# Patient Record
Sex: Male | Born: 1965 | Race: White | Hispanic: No | Marital: Single | Smoking: Current every day smoker
Health system: Southern US, Community
[De-identification: ages and names within clinical notes are randomized; demographics above are authoritative.]

## PROBLEM LIST (undated history)

## (undated) DIAGNOSIS — I10 Essential (primary) hypertension: Secondary | ICD-10-CM

## (undated) DIAGNOSIS — M1990 Unspecified osteoarthritis, unspecified site: Secondary | ICD-10-CM

## (undated) DIAGNOSIS — F329 Major depressive disorder, single episode, unspecified: Secondary | ICD-10-CM

## (undated) DIAGNOSIS — G4700 Insomnia, unspecified: Secondary | ICD-10-CM

## (undated) DIAGNOSIS — F32A Depression, unspecified: Secondary | ICD-10-CM

## (undated) DIAGNOSIS — G43909 Migraine, unspecified, not intractable, without status migrainosus: Secondary | ICD-10-CM

## (undated) DIAGNOSIS — C801 Malignant (primary) neoplasm, unspecified: Secondary | ICD-10-CM

## (undated) DIAGNOSIS — Z87898 Personal history of other specified conditions: Secondary | ICD-10-CM

## (undated) DIAGNOSIS — N2889 Other specified disorders of kidney and ureter: Secondary | ICD-10-CM

## (undated) DIAGNOSIS — F419 Anxiety disorder, unspecified: Secondary | ICD-10-CM

## (undated) HISTORY — PX: MANDIBLE FRACTURE SURGERY: SHX706

---
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
## 2001-10-08 ENCOUNTER — Emergency Department (HOSPITAL_COMMUNITY): Admission: EM | Admit: 2001-10-08 | Discharge: 2001-10-08 | Payer: Self-pay | Admitting: Emergency Medicine

## 2007-01-16 ENCOUNTER — Emergency Department (HOSPITAL_COMMUNITY): Admission: EM | Admit: 2007-01-16 | Discharge: 2007-01-16 | Payer: Self-pay | Admitting: Emergency Medicine

## 2007-02-15 ENCOUNTER — Emergency Department (HOSPITAL_COMMUNITY): Admission: EM | Admit: 2007-02-15 | Discharge: 2007-02-15 | Payer: Self-pay | Admitting: Emergency Medicine

## 2007-06-22 ENCOUNTER — Emergency Department (HOSPITAL_COMMUNITY): Admission: EM | Admit: 2007-06-22 | Discharge: 2007-06-23 | Payer: Self-pay | Admitting: Emergency Medicine

## 2007-09-09 ENCOUNTER — Emergency Department (HOSPITAL_COMMUNITY): Admission: EM | Admit: 2007-09-09 | Discharge: 2007-09-09 | Payer: Self-pay | Admitting: Emergency Medicine

## 2007-11-22 ENCOUNTER — Emergency Department (HOSPITAL_COMMUNITY): Admission: EM | Admit: 2007-11-22 | Discharge: 2007-11-22 | Payer: Self-pay | Admitting: Emergency Medicine

## 2007-12-09 ENCOUNTER — Emergency Department (HOSPITAL_COMMUNITY): Admission: EM | Admit: 2007-12-09 | Discharge: 2007-12-10 | Payer: Self-pay | Admitting: Emergency Medicine

## 2007-12-11 ENCOUNTER — Emergency Department (HOSPITAL_COMMUNITY): Admission: EM | Admit: 2007-12-11 | Discharge: 2007-12-11 | Payer: Self-pay | Admitting: Emergency Medicine

## 2008-04-19 ENCOUNTER — Emergency Department (HOSPITAL_COMMUNITY): Admission: EM | Admit: 2008-04-19 | Discharge: 2008-04-19 | Payer: Self-pay | Admitting: Emergency Medicine

## 2008-04-25 ENCOUNTER — Emergency Department (HOSPITAL_COMMUNITY): Admission: EM | Admit: 2008-04-25 | Discharge: 2008-04-25 | Payer: Self-pay | Admitting: Emergency Medicine

## 2008-07-24 ENCOUNTER — Emergency Department (HOSPITAL_COMMUNITY): Admission: EM | Admit: 2008-07-24 | Discharge: 2008-07-24 | Payer: Self-pay | Admitting: Emergency Medicine

## 2008-07-28 ENCOUNTER — Emergency Department (HOSPITAL_COMMUNITY): Admission: EM | Admit: 2008-07-28 | Discharge: 2008-07-29 | Payer: Self-pay | Admitting: Emergency Medicine

## 2008-08-08 ENCOUNTER — Emergency Department: Payer: Self-pay | Admitting: Emergency Medicine

## 2009-02-14 ENCOUNTER — Emergency Department (HOSPITAL_COMMUNITY): Admission: EM | Admit: 2009-02-14 | Discharge: 2009-02-14 | Payer: Self-pay | Admitting: Emergency Medicine

## 2009-02-14 ENCOUNTER — Emergency Department (HOSPITAL_COMMUNITY): Admission: EM | Admit: 2009-02-14 | Discharge: 2009-02-14 | Payer: Self-pay | Admitting: Dentistry

## 2009-05-18 ENCOUNTER — Emergency Department (HOSPITAL_COMMUNITY): Admission: EM | Admit: 2009-05-18 | Discharge: 2009-05-18 | Payer: Self-pay | Admitting: Emergency Medicine

## 2009-05-18 ENCOUNTER — Emergency Department (HOSPITAL_COMMUNITY): Admission: EM | Admit: 2009-05-18 | Discharge: 2009-05-19 | Payer: Self-pay | Admitting: Emergency Medicine

## 2010-08-01 DIAGNOSIS — C642 Malignant neoplasm of left kidney, except renal pelvis: Secondary | ICD-10-CM

## 2010-08-01 DIAGNOSIS — N2889 Other specified disorders of kidney and ureter: Secondary | ICD-10-CM

## 2010-08-01 HISTORY — PX: ROBOTIC ASSITED PARTIAL NEPHRECTOMY: SHX6087

## 2010-08-01 HISTORY — DX: Other specified disorders of kidney and ureter: N28.89

## 2010-08-01 HISTORY — DX: Malignant neoplasm of left kidney, except renal pelvis: C64.2

## 2010-09-17 ENCOUNTER — Emergency Department (HOSPITAL_COMMUNITY): Payer: BLUE CROSS/BLUE SHIELD

## 2010-09-17 ENCOUNTER — Emergency Department (HOSPITAL_COMMUNITY)
Admission: EM | Admit: 2010-09-17 | Discharge: 2010-09-17 | Disposition: A | Discharge status: Home / Self Care | Payer: BLUE CROSS/BLUE SHIELD | Attending: Emergency Medicine | Admitting: Emergency Medicine

## 2010-09-17 DIAGNOSIS — R0789 Other chest pain: Secondary | ICD-10-CM | POA: Insufficient documentation

## 2010-09-17 DIAGNOSIS — S2239XA Fracture of one rib, unspecified side, initial encounter for closed fracture: Secondary | ICD-10-CM | POA: Insufficient documentation

## 2010-09-17 DIAGNOSIS — R071 Chest pain on breathing: Secondary | ICD-10-CM | POA: Insufficient documentation

## 2010-11-04 LAB — COMPREHENSIVE METABOLIC PANEL
ALT: 82 U/L — ABNORMAL HIGH (ref 0–53)
Alkaline Phosphatase: 54 U/L (ref 39–117)
CO2: 28 mEq/L (ref 19–32)
Calcium: 8.3 mg/dL — ABNORMAL LOW (ref 8.4–10.5)
Chloride: 99 mEq/L (ref 96–112)
Creatinine, Ser: 0.82 mg/dL (ref 0.4–1.5)
GFR calc Af Amer: >60 mL/min (ref >60)
Glucose, Bld: 92 mg/dL (ref 70–99)
Total Bilirubin: 0.7 mg/dL (ref 0.3–1.2)

## 2010-11-04 LAB — CBC
MCHC: 36 g/dL (ref 30.0–36.0)
MCV: 94.3 fL (ref 78.0–100.0)
Platelets: 128 10*3/uL — ABNORMAL LOW (ref 150–400)
WBC: 6.2 10*3/uL (ref 4.0–10.5)

## 2010-11-04 LAB — RAPID URINE DRUG SCREEN, HOSP PERFORMED
Amphetamines: NOT DETECTED
Barbiturates: NOT DETECTED
Benzodiazepines: POSITIVE — AB

## 2010-11-04 LAB — ETHANOL: Alcohol, Ethyl (B): 346 mg/dL — ABNORMAL HIGH (ref 0–10)

## 2010-11-04 LAB — DIFFERENTIAL
Basophils Relative: 0 % (ref 0–1)
Lymphs Abs: 1.6 10*3/uL (ref 0.7–4.0)
Neutrophils Relative %: 65 % (ref 43–77)

## 2010-11-07 LAB — HEPATIC FUNCTION PANEL
ALT: 21 U/L (ref 0–53)
AST: 38 U/L — ABNORMAL HIGH (ref 0–37)
Albumin: 4.1 g/dL (ref 3.5–5.2)
Alkaline Phosphatase: 58 U/L (ref 39–117)
Bilirubin, Direct: 0.1 mg/dL (ref 0.0–0.3)
Indirect Bilirubin: 0.5 mg/dL (ref 0.3–0.9)
Total Protein: 6.7 g/dL (ref 6.0–8.3)

## 2010-11-07 LAB — ETHANOL
Alcohol, Ethyl (B): 214 mg/dL — ABNORMAL HIGH (ref 0–10)
Alcohol, Ethyl (B): 304 mg/dL — ABNORMAL HIGH (ref 0–10)
Alcohol, Ethyl (B): 52 mg/dL — ABNORMAL HIGH (ref 0–10)

## 2010-11-07 LAB — CBC
HCT: 40.6 % (ref 39.0–52.0)
Hemoglobin: 14.6 g/dL (ref 13.0–17.0)
Platelets: 178 10*3/uL (ref 150–400)
RBC: 4.34 MIL/uL (ref 4.22–5.81)

## 2010-11-07 LAB — RAPID URINE DRUG SCREEN, HOSP PERFORMED
Benzodiazepines: POSITIVE — AB
Cocaine: NOT DETECTED
Tetrahydrocannabinol: NOT DETECTED

## 2010-11-07 LAB — BASIC METABOLIC PANEL
CO2: 29 mEq/L (ref 19–32)
Chloride: 105 mEq/L (ref 96–112)
Creatinine, Ser: 0.93 mg/dL (ref 0.4–1.5)
GFR calc non Af Amer: >60 mL/min (ref >60)
Glucose, Bld: 106 mg/dL — ABNORMAL HIGH (ref 70–99)
Sodium: 138 mEq/L (ref 135–145)

## 2010-11-07 LAB — DIFFERENTIAL
Basophils Absolute: 0 10*3/uL (ref 0.0–0.1)
Eosinophils Relative: 2 % (ref 0–5)
Lymphocytes Relative: 30 % (ref 12–46)
Monocytes Relative: 10 % (ref 3–12)
Neutrophils Relative %: 58 % (ref 43–77)

## 2011-04-26 LAB — DIFFERENTIAL
Basophils Absolute: 0.1
Eosinophils Absolute: 0.5

## 2011-04-26 LAB — URINALYSIS, ROUTINE W REFLEX MICROSCOPIC
Ketones, ur: NEGATIVE
Nitrite: NEGATIVE
Protein, ur: NEGATIVE
Specific Gravity, Urine: 1.02
Urobilinogen, UA: 0.2

## 2011-04-26 LAB — RAPID URINE DRUG SCREEN, HOSP PERFORMED
Amphetamines: NOT DETECTED
Cocaine: NOT DETECTED
Opiates: POSITIVE — AB

## 2011-04-26 LAB — CBC
Hemoglobin: 15.1
MCV: 88.5

## 2011-04-26 LAB — BASIC METABOLIC PANEL
BUN: 9
CO2: 23
Chloride: 105
GFR calc Af Amer: >60
GFR calc non Af Amer: >60
Glucose, Bld: 100 — ABNORMAL HIGH
Potassium: 3.4 — ABNORMAL LOW
Sodium: 140

## 2011-05-06 ENCOUNTER — Other Ambulatory Visit: Payer: Self-pay | Admitting: Urology

## 2011-05-06 ENCOUNTER — Ambulatory Visit (HOSPITAL_COMMUNITY)
Admission: RE | Admit: 2011-05-06 | Discharge: 2011-05-06 | Disposition: A | Discharge status: Home / Self Care | Payer: Medicaid Other | Source: Ambulatory Visit | Attending: Urology | Admitting: Urology

## 2011-05-06 ENCOUNTER — Ambulatory Visit (INDEPENDENT_AMBULATORY_CARE_PROVIDER_SITE_OTHER): Payer: Self-pay | Admitting: Urology

## 2011-05-06 DIAGNOSIS — C649 Malignant neoplasm of unspecified kidney, except renal pelvis: Secondary | ICD-10-CM | POA: Insufficient documentation

## 2011-05-06 DIAGNOSIS — R6882 Decreased libido: Secondary | ICD-10-CM

## 2011-05-06 DIAGNOSIS — N401 Enlarged prostate with lower urinary tract symptoms: Secondary | ICD-10-CM

## 2011-05-06 DIAGNOSIS — N500 Atrophy of testis: Secondary | ICD-10-CM

## 2011-05-06 DIAGNOSIS — F17200 Nicotine dependence, unspecified, uncomplicated: Secondary | ICD-10-CM | POA: Insufficient documentation

## 2011-05-06 DIAGNOSIS — N138 Other obstructive and reflux uropathy: Secondary | ICD-10-CM

## 2011-05-06 DIAGNOSIS — N289 Disorder of kidney and ureter, unspecified: Secondary | ICD-10-CM

## 2011-05-06 LAB — BASIC METABOLIC PANEL
CO2: 30 mEq/L (ref 19–32)
Calcium: 9.2 mg/dL (ref 8.4–10.5)
Chloride: 106 mEq/L (ref 96–112)
Creatinine, Ser: 0.94 mg/dL (ref 0.4–1.5)
Glucose, Bld: 83 mg/dL (ref 70–99)

## 2011-05-06 LAB — CBC
HCT: 48.1 % (ref 39.0–52.0)
Hemoglobin: 16.6 g/dL (ref 13.0–17.0)
Hemoglobin: 17 g/dL (ref 13.0–17.0)
MCHC: 34.4 g/dL (ref 30.0–36.0)
MCV: 92.2 fL (ref 78.0–100.0)
RBC: 5.23 MIL/uL (ref 4.22–5.81)
RBC: 5.25 MIL/uL (ref 4.22–5.81)
RDW: 13.8 % (ref 11.5–15.5)

## 2011-05-06 LAB — COMPREHENSIVE METABOLIC PANEL
ALT: 21 U/L (ref 0–53)
Alkaline Phosphatase: 60 U/L (ref 39–117)
BUN: 5 mg/dL — ABNORMAL LOW (ref 6–23)
CO2: 27 mEq/L (ref 19–32)
Chloride: 105 mEq/L (ref 96–112)
GFR calc non Af Amer: >60 mL/min (ref >60)
Glucose, Bld: 98 mg/dL (ref 70–99)
Potassium: 4.1 mEq/L (ref 3.5–5.1)
Sodium: 139 mEq/L (ref 135–145)
Total Bilirubin: 0.7 mg/dL (ref 0.3–1.2)

## 2011-05-06 LAB — DIFFERENTIAL
Basophils Absolute: 0 10*3/uL (ref 0.0–0.1)
Basophils Absolute: 0 10*3/uL (ref 0.0–0.1)
Basophils Relative: 0 % (ref 0–1)
Basophils Relative: 1 % (ref 0–1)
Eosinophils Absolute: 0.1 10*3/uL (ref 0.0–0.7)
Eosinophils Absolute: 0.2 10*3/uL (ref 0.0–0.7)
Monocytes Absolute: 0.6 10*3/uL (ref 0.1–1.0)
Monocytes Relative: 5 % (ref 3–12)
Monocytes Relative: 8 % (ref 3–12)
Neutro Abs: 4.9 10*3/uL (ref 1.7–7.7)
Neutro Abs: 7 10*3/uL (ref 1.7–7.7)
Neutrophils Relative %: 65 % (ref 43–77)
Neutrophils Relative %: 66 % (ref 43–77)

## 2011-05-06 LAB — ETHANOL
Alcohol, Ethyl (B): 162 mg/dL — ABNORMAL HIGH (ref 0–10)
Alcohol, Ethyl (B): 240 mg/dL — ABNORMAL HIGH (ref 0–10)
Alcohol, Ethyl (B): 335 mg/dL — ABNORMAL HIGH (ref 0–10)

## 2011-05-06 LAB — RAPID URINE DRUG SCREEN, HOSP PERFORMED
Cocaine: NOT DETECTED
Tetrahydrocannabinol: NOT DETECTED

## 2011-05-10 LAB — CBC
Hemoglobin: 16.8
MCV: 91.9
RBC: 5.06
WBC: 8.8

## 2011-05-10 LAB — DIFFERENTIAL
Lymphs Abs: 2.3
Monocytes Relative: 7
Neutro Abs: 5.6
Neutrophils Relative %: 64

## 2011-05-10 LAB — BASIC METABOLIC PANEL
Chloride: 105
Creatinine, Ser: 0.85
GFR calc Af Amer: >60
Sodium: 142

## 2011-05-10 LAB — ETHANOL
Alcohol, Ethyl (B): 212 — ABNORMAL HIGH
Alcohol, Ethyl (B): 310 — ABNORMAL HIGH

## 2011-05-10 LAB — RAPID URINE DRUG SCREEN, HOSP PERFORMED: Benzodiazepines: NOT DETECTED

## 2011-05-16 LAB — RAPID URINE DRUG SCREEN, HOSP PERFORMED
Amphetamines: NOT DETECTED
Barbiturates: NOT DETECTED
Benzodiazepines: POSITIVE — AB

## 2011-05-16 LAB — AMYLASE: Amylase: 26 — ABNORMAL LOW

## 2011-05-16 LAB — DIFFERENTIAL
Eosinophils Relative: 3
Lymphocytes Relative: 15
Lymphs Abs: 1.2
Monocytes Absolute: 0.7

## 2011-05-16 LAB — BASIC METABOLIC PANEL
GFR calc non Af Amer: >60
Potassium: 3.9
Sodium: 139

## 2011-05-16 LAB — CBC
HCT: 44.7
Hemoglobin: 15.8
RBC: 4.81
WBC: 8.1

## 2011-05-16 LAB — LIPASE, BLOOD: Lipase: 15

## 2011-05-16 LAB — ETHANOL: Alcohol, Ethyl (B): 13 — ABNORMAL HIGH

## 2011-05-18 LAB — DIFFERENTIAL
Lymphocytes Relative: 22
Monocytes Absolute: 0.5
Monocytes Relative: 8
Neutro Abs: 4.4

## 2011-05-18 LAB — URINALYSIS, ROUTINE W REFLEX MICROSCOPIC
Glucose, UA: NEGATIVE
Ketones, ur: NEGATIVE
Leukocytes, UA: NEGATIVE
pH: 6

## 2011-05-18 LAB — BASIC METABOLIC PANEL
CO2: 29
Calcium: 9
GFR calc Af Amer: >60
GFR calc non Af Amer: >60
Sodium: 138

## 2011-05-18 LAB — CBC
Hemoglobin: 16.3
MCHC: 36
RBC: 5.02

## 2011-05-18 LAB — RAPID URINE DRUG SCREEN, HOSP PERFORMED
Barbiturates: NOT DETECTED
Cocaine: NOT DETECTED
Opiates: NOT DETECTED

## 2011-05-20 ENCOUNTER — Ambulatory Visit (INDEPENDENT_AMBULATORY_CARE_PROVIDER_SITE_OTHER): Payer: Medicaid Other | Admitting: Urology

## 2011-05-20 DIAGNOSIS — N138 Other obstructive and reflux uropathy: Secondary | ICD-10-CM

## 2011-05-20 DIAGNOSIS — N401 Enlarged prostate with lower urinary tract symptoms: Secondary | ICD-10-CM

## 2011-05-20 DIAGNOSIS — C649 Malignant neoplasm of unspecified kidney, except renal pelvis: Secondary | ICD-10-CM

## 2011-05-20 DIAGNOSIS — R6882 Decreased libido: Secondary | ICD-10-CM

## 2011-06-24 ENCOUNTER — Encounter (HOSPITAL_COMMUNITY): Payer: Self-pay

## 2011-07-01 ENCOUNTER — Encounter (HOSPITAL_COMMUNITY): Payer: Self-pay

## 2011-07-01 ENCOUNTER — Ambulatory Visit (HOSPITAL_COMMUNITY)
Admission: RE | Admit: 2011-07-01 | Discharge: 2011-07-01 | Disposition: A | Discharge status: Home / Self Care | Payer: Medicaid Other | Source: Ambulatory Visit | Attending: Urology | Admitting: Urology

## 2011-07-01 DIAGNOSIS — Z01812 Encounter for preprocedural laboratory examination: Secondary | ICD-10-CM | POA: Insufficient documentation

## 2011-07-01 HISTORY — DX: Other specified disorders of kidney and ureter: N28.89

## 2011-07-01 LAB — CBC
HCT: 39.4 % (ref 39.0–52.0)
Hemoglobin: 14.5 g/dL (ref 13.0–17.0)
MCH: 32.4 pg (ref 26.0–34.0)
MCHC: 36.8 g/dL — ABNORMAL HIGH (ref 30.0–36.0)
MCV: 88.1 fL (ref 78.0–100.0)
Platelets: 137 10*3/uL — ABNORMAL LOW (ref 150–400)
RBC: 4.47 MIL/uL (ref 4.22–5.81)
RDW: 11.7 % (ref 11.5–15.5)
WBC: 6.9 10*3/uL (ref 4.0–10.5)

## 2011-07-01 LAB — SURGICAL PCR SCREEN: Staphylococcus aureus: NEGATIVE

## 2011-07-01 NOTE — Patient Instructions (Addendum)
20 Kevin MATSUO  07/01/2011   Your procedure is scheduled on:  07-04-11  Report to [facility] at 0930 AM.  Call this number if you have problems the morning of surgery: [phone]   Remember:   Do not eat food:After Midnight. Saturday night  May have clear liquids:until Midnight . Clear liquids all day Sunday, magensoim citrate 1 bottle by noon Sunday 07-03-11, fleets enema bedtime Sunday 06-03-11  Clear liquids include soda, tea, black coffee, apple or grape juice, broth.  Take these medicines the morning of surgery with A SIP OF WATER:hydrocodone if needed   Do not wear jewelry,   Do not wear lotions, powders, or perfumes..  Do not shave 48 hours prior to surgery.  Do not bring valuables to the hospital.  Contacts, dentures or bridgework may not be worn into surgery.  Leave suitcase in the car. After surgery it may be brought to your room.  For patients admitted to the hospital, checkout time is 11:00 AM the day of discharge.   Patients discharged the day of surgery will not be allowed to drive home.    Special Instructions: CHG Shower Use Special Wash: 1/2 bottle night before surgery and 1/2 bottle morning of surgery., neck down avoid private area   Please read over the following fact sheets that you were given: MRSA Information, blood fact sheet              [name] [name] rn wl pre op nurse [phone] phone

## 2011-07-01 NOTE — Pre-Procedure Instructions (Signed)
Chest 2 view xray 05-26-11 [place] in epic under umaging

## 2011-07-04 ENCOUNTER — Encounter (HOSPITAL_COMMUNITY): Payer: Self-pay | Admitting: Anesthesiology

## 2011-07-04 ENCOUNTER — Inpatient Hospital Stay (HOSPITAL_COMMUNITY): Payer: Medicaid Other | Admitting: Anesthesiology

## 2011-07-04 ENCOUNTER — Encounter (HOSPITAL_COMMUNITY): Payer: Self-pay | Admitting: *Deleted

## 2011-07-04 ENCOUNTER — Encounter (HOSPITAL_COMMUNITY)
Admission: RE | Disposition: A | Discharge status: Home / Self Care | Payer: Self-pay | Source: Ambulatory Visit | Attending: Urology

## 2011-07-04 ENCOUNTER — Other Ambulatory Visit: Payer: Self-pay | Admitting: Urology

## 2011-07-04 ENCOUNTER — Inpatient Hospital Stay (HOSPITAL_COMMUNITY)
Admission: RE | Admit: 2011-07-04 | Discharge: 2011-07-08 | DRG: 657 | Disposition: A | Discharge status: Home / Self Care | Payer: Medicaid Other | Source: Ambulatory Visit | Attending: Urology | Admitting: Urology

## 2011-07-04 DIAGNOSIS — F17200 Nicotine dependence, unspecified, uncomplicated: Secondary | ICD-10-CM | POA: Diagnosis present

## 2011-07-04 DIAGNOSIS — R51 Headache: Secondary | ICD-10-CM | POA: Diagnosis not present

## 2011-07-04 DIAGNOSIS — F411 Generalized anxiety disorder: Secondary | ICD-10-CM | POA: Diagnosis present

## 2011-07-04 DIAGNOSIS — R109 Unspecified abdominal pain: Secondary | ICD-10-CM | POA: Diagnosis not present

## 2011-07-04 DIAGNOSIS — D696 Thrombocytopenia, unspecified: Secondary | ICD-10-CM | POA: Diagnosis not present

## 2011-07-04 DIAGNOSIS — F329 Major depressive disorder, single episode, unspecified: Secondary | ICD-10-CM | POA: Diagnosis present

## 2011-07-04 DIAGNOSIS — F3289 Other specified depressive episodes: Secondary | ICD-10-CM | POA: Diagnosis present

## 2011-07-04 DIAGNOSIS — K560 Paralytic ileus: Secondary | ICD-10-CM | POA: Diagnosis not present

## 2011-07-04 DIAGNOSIS — I10 Essential (primary) hypertension: Secondary | ICD-10-CM | POA: Diagnosis present

## 2011-07-04 DIAGNOSIS — K567 Ileus, unspecified: Secondary | ICD-10-CM

## 2011-07-04 DIAGNOSIS — C649 Malignant neoplasm of unspecified kidney, except renal pelvis: Principal | ICD-10-CM | POA: Diagnosis present

## 2011-07-04 LAB — TYPE AND SCREEN
ABO/RH(D): A POS
Antibody Screen: NEGATIVE

## 2011-07-04 SURGERY — ROBOTIC ASSITED PARTIAL NEPHRECTOMY
Anesthesia: General | Laterality: Left | Wound class: Clean Contaminated

## 2011-07-04 MED ORDER — FENTANYL CITRATE 0.05 MG/ML IJ SOLN
INTRAMUSCULAR | Status: DC | PRN
  Administered 2011-07-04 (×3): 50 ug via INTRAVENOUS
  Administered 2011-07-04: 100 ug via INTRAVENOUS

## 2011-07-04 MED ORDER — CEFAZOLIN SODIUM 1-5 GM-% IV SOLN
1.0000 g | Freq: Three times a day (TID) | INTRAVENOUS | Status: AC
  Administered 2011-07-04 – 2011-07-05 (×2): 1 g via INTRAVENOUS
  Filled 2011-07-04 (×2): qty 50

## 2011-07-04 MED ORDER — LACTATED RINGERS IV SOLN: INTRAVENOUS | Status: DC

## 2011-07-04 MED ORDER — LACTATED RINGERS IR SOLN
Status: DC | PRN
  Administered 2011-07-04: 1000 mL

## 2011-07-04 MED ORDER — THIAMINE HCL 100 MG/ML IJ SOLN
100.0000 mg | Freq: Every day | INTRAMUSCULAR | Status: DC
  Administered 2011-07-04: 100 mg via INTRAVENOUS
  Filled 2011-07-04 (×6): qty 1

## 2011-07-04 MED ORDER — HEMOSTATIC AGENTS (NO CHARGE) OPTIME
TOPICAL | Status: DC | PRN
  Administered 2011-07-04: 1

## 2011-07-04 MED ORDER — POLYETHYLENE GLYCOL 3350 17 G PO PACK
17.0000 g | PACK | Freq: Every day | ORAL | Status: DC
  Administered 2011-07-04: 17 g via ORAL
  Filled 2011-07-04 (×2): qty 1

## 2011-07-04 MED ORDER — HYDROCODONE-ACETAMINOPHEN 5-500 MG PO TABS: 1.0000 | ORAL_TABLET | Freq: Four times a day (QID) | ORAL | Status: DC | PRN

## 2011-07-04 MED ORDER — PROMETHAZINE HCL 25 MG/ML IJ SOLN: 6.2500 mg | INTRAMUSCULAR | Status: DC | PRN

## 2011-07-04 MED ORDER — NEOSTIGMINE METHYLSULFATE 1 MG/ML IJ SOLN
INTRAMUSCULAR | Status: DC | PRN
  Administered 2011-07-04: 5 mg via INTRAVENOUS

## 2011-07-04 MED ORDER — HYDROMORPHONE HCL PF 1 MG/ML IJ SOLN
0.2500 mg | INTRAMUSCULAR | Status: DC | PRN
  Administered 2011-07-04 (×4): 0.5 mg via INTRAVENOUS

## 2011-07-04 MED ORDER — LORAZEPAM 1 MG PO TABS: 1.0000 mg | ORAL_TABLET | Freq: Four times a day (QID) | ORAL | Status: AC | PRN

## 2011-07-04 MED ORDER — LACTATED RINGERS IV SOLN
INTRAVENOUS | Status: DC | PRN
  Administered 2011-07-04 (×4): via INTRAVENOUS

## 2011-07-04 MED ORDER — GLYCOPYRROLATE 0.2 MG/ML IJ SOLN
INTRAMUSCULAR | Status: DC | PRN
  Administered 2011-07-04: .8 mg via INTRAVENOUS

## 2011-07-04 MED ORDER — BUPIVACAINE LIPOSOME 1.3 % IJ SUSP
20.0000 mL | Freq: Once | INTRAMUSCULAR | Status: AC
  Administered 2011-07-04: 20 mL
  Filled 2011-07-04: qty 20

## 2011-07-04 MED ORDER — ONDANSETRON HCL 4 MG/2ML IJ SOLN
4.0000 mg | INTRAMUSCULAR | Status: DC | PRN
  Administered 2011-07-05 – 2011-07-07 (×4): 4 mg via INTRAVENOUS
  Filled 2011-07-04 (×4): qty 2

## 2011-07-04 MED ORDER — CEFAZOLIN SODIUM 1-5 GM-% IV SOLN
1.0000 g | Freq: Once | INTRAVENOUS | Status: AC
  Administered 2011-07-04: 1 g via INTRAVENOUS

## 2011-07-04 MED ORDER — DEXAMETHASONE SODIUM PHOSPHATE 4 MG/ML IJ SOLN
INTRAMUSCULAR | Status: DC | PRN
  Administered 2011-07-04: 10 mg via INTRAVENOUS

## 2011-07-04 MED ORDER — LIDOCAINE HCL (CARDIAC) 20 MG/ML IV SOLN
INTRAVENOUS | Status: DC | PRN
  Administered 2011-07-04: 80 mg via INTRAVENOUS
  Administered 2011-07-04: 100 mg via INTRAVENOUS

## 2011-07-04 MED ORDER — OXYCODONE HCL 5 MG PO TABS
5.0000 mg | ORAL_TABLET | ORAL | Status: DC | PRN
  Administered 2011-07-04 – 2011-07-07 (×7): 10 mg via ORAL
  Administered 2011-07-07 (×2): 5 mg via ORAL
  Administered 2011-07-08 (×2): 10 mg via ORAL
  Administered 2011-07-08: 5 mg via ORAL
  Administered 2011-07-08: 10 mg via ORAL
  Administered 2011-07-08: 5 mg via ORAL
  Filled 2011-07-04 (×3): qty 2
  Filled 2011-07-04: qty 1
  Filled 2011-07-04 (×2): qty 2
  Filled 2011-07-04 (×3): qty 1
  Filled 2011-07-04 (×5): qty 2

## 2011-07-04 MED ORDER — MANNITOL 25 % IV SOLN
INTRAVENOUS | Status: DC | PRN
  Administered 2011-07-04 (×2): 50 mL via INTRAVENOUS

## 2011-07-04 MED ORDER — FOLIC ACID 1 MG PO TABS
1.0000 mg | ORAL_TABLET | Freq: Every day | ORAL | Status: DC
  Administered 2011-07-04 – 2011-07-08 (×4): 1 mg via ORAL
  Filled 2011-07-04 (×6): qty 1

## 2011-07-04 MED ORDER — ROCURONIUM BROMIDE 100 MG/10ML IV SOLN
INTRAVENOUS | Status: DC | PRN
  Administered 2011-07-04: 30 mg via INTRAVENOUS
  Administered 2011-07-04: 10 mg via INTRAVENOUS
  Administered 2011-07-04: 20 mg via INTRAVENOUS
  Administered 2011-07-04: 10 mg via INTRAVENOUS
  Administered 2011-07-04: 50 mg via INTRAVENOUS
  Administered 2011-07-04 (×2): 10 mg via INTRAVENOUS

## 2011-07-04 MED ORDER — STERILE WATER FOR IRRIGATION IR SOLN
Status: DC | PRN
  Administered 2011-07-04: 1500 mL

## 2011-07-04 MED ORDER — VITAMIN B-1 100 MG PO TABS
100.0000 mg | ORAL_TABLET | Freq: Every day | ORAL | Status: DC
  Administered 2011-07-05 – 2011-07-08 (×3): 100 mg via ORAL
  Filled 2011-07-04 (×6): qty 1

## 2011-07-04 MED ORDER — MIDAZOLAM HCL 5 MG/5ML IJ SOLN
INTRAMUSCULAR | Status: DC | PRN
  Administered 2011-07-04: 2 mg via INTRAVENOUS

## 2011-07-04 MED ORDER — MORPHINE SULFATE 4 MG/ML IJ SOLN
2.0000 mg | INTRAMUSCULAR | Status: DC | PRN
  Administered 2011-07-04: 4 mg via INTRAVENOUS
  Administered 2011-07-04: 2 mg via INTRAVENOUS
  Administered 2011-07-04: 4 mg via INTRAVENOUS
  Administered 2011-07-04: 2 mg via INTRAVENOUS
  Administered 2011-07-05 (×9): 4 mg via INTRAVENOUS
  Filled 2011-07-04 (×4): qty 2
  Filled 2011-07-04: qty 1
  Filled 2011-07-04 (×2): qty 2
  Filled 2011-07-04: qty 1
  Filled 2011-07-04 (×4): qty 2

## 2011-07-04 MED ORDER — LORAZEPAM 2 MG/ML IJ SOLN: 1.0000 mg | Freq: Four times a day (QID) | INTRAMUSCULAR | Status: AC | PRN

## 2011-07-04 MED ORDER — ONDANSETRON HCL 4 MG/2ML IJ SOLN
INTRAMUSCULAR | Status: DC | PRN
  Administered 2011-07-04: 4 mg via INTRAVENOUS

## 2011-07-04 MED ORDER — ACETAMINOPHEN 325 MG PO TABS
650.0000 mg | ORAL_TABLET | ORAL | Status: DC | PRN
  Administered 2011-07-07: 650 mg via ORAL
  Filled 2011-07-04 (×2): qty 2

## 2011-07-04 MED ORDER — PROPOFOL 10 MG/ML IV EMUL
INTRAVENOUS | Status: DC | PRN
  Administered 2011-07-04: 200 mg via INTRAVENOUS
  Administered 2011-07-04: 50 mg via INTRAVENOUS

## 2011-07-04 MED ORDER — MORPHINE SULFATE 2 MG/ML IJ SOLN
INTRAMUSCULAR | Status: AC
  Administered 2011-07-04: 2 mg via INTRAVENOUS
  Filled 2011-07-04: qty 1

## 2011-07-04 MED ORDER — METOPROLOL TARTRATE 12.5 MG HALF TABLET: 12.5000 mg | ORAL_TABLET | Freq: Once | ORAL | Status: DC

## 2011-07-04 MED ORDER — ACETAMINOPHEN 10 MG/ML IV SOLN
INTRAVENOUS | Status: DC | PRN
  Administered 2011-07-04: 1000 mg via INTRAVENOUS

## 2011-07-04 MED ORDER — PNEUMOCOCCAL VAC POLYVALENT 25 MCG/0.5ML IJ INJ
0.5000 mL | INJECTION | INTRAMUSCULAR | Status: AC
  Administered 2011-07-05: 0.5 mL via INTRAMUSCULAR
  Filled 2011-07-04: qty 0.5

## 2011-07-04 MED ORDER — SENNOSIDES-DOCUSATE SODIUM 8.6-50 MG PO TABS
1.0000 | ORAL_TABLET | Freq: Two times a day (BID) | ORAL | Status: DC
  Administered 2011-07-04 – 2011-07-08 (×8): 1 via ORAL
  Filled 2011-07-04 (×11): qty 1

## 2011-07-04 MED ORDER — HYDROMORPHONE HCL PF 1 MG/ML IJ SOLN
INTRAMUSCULAR | Status: DC | PRN
  Administered 2011-07-04 (×2): .5 mg via INTRAVENOUS
  Administered 2011-07-04: 1 mg via INTRAVENOUS
  Administered 2011-07-04 (×2): .5 mg via INTRAVENOUS

## 2011-07-04 MED ORDER — SODIUM CHLORIDE 0.9 % IV SOLN
INTRAVENOUS | Status: DC
  Administered 2011-07-04 – 2011-07-05 (×3): via INTRAVENOUS

## 2011-07-04 MED ORDER — BELLADONNA ALKALOIDS-OPIUM 16.2-60 MG RE SUPP: 1.0000 | Freq: Four times a day (QID) | RECTAL | Status: DC | PRN

## 2011-07-04 MED ORDER — THERA M PLUS PO TABS
1.0000 | ORAL_TABLET | Freq: Every day | ORAL | Status: DC
  Administered 2011-07-04 – 2011-07-08 (×4): 1 via ORAL
  Filled 2011-07-04 (×6): qty 1

## 2011-07-04 MED ORDER — NICOTINE 14 MG/24HR TD PT24
14.0000 mg | MEDICATED_PATCH | Freq: Every day | TRANSDERMAL | Status: DC
  Administered 2011-07-04 – 2011-07-08 (×5): 14 mg via TRANSDERMAL
  Filled 2011-07-04 (×6): qty 1

## 2011-07-04 MED ORDER — METOPROLOL TARTRATE 1 MG/ML IV SOLN
5.0000 mg | Freq: Four times a day (QID) | INTRAVENOUS | Status: DC | PRN
  Administered 2011-07-07: 5 mg via INTRAVENOUS
  Filled 2011-07-04: qty 5

## 2011-07-04 MED ORDER — BACITRACIN-NEOMYCIN-POLYMYXIN 400-5-5000 EX OINT: 1.0000 "application " | TOPICAL_OINTMENT | Freq: Three times a day (TID) | CUTANEOUS | Status: DC | PRN

## 2011-07-04 MED ORDER — ACETAMINOPHEN 10 MG/ML IV SOLN
1000.0000 mg | Freq: Four times a day (QID) | INTRAVENOUS | Status: AC
  Administered 2011-07-04 – 2011-07-05 (×4): 1000 mg via INTRAVENOUS
  Filled 2011-07-04 (×4): qty 100

## 2011-07-04 SURGICAL SUPPLY — 72 items
ADH SKN CLS APL DERMABOND .7 (GAUZE/BANDAGES/DRESSINGS) ×1
APL ESCP 34 STRL LF DISP (HEMOSTASIS) ×2
APPLICATOR SURGIFLO ENDO (HEMOSTASIS) ×4 IMPLANT
APPLIER CLIP 5 13 M/L LIGAMAX5 (MISCELLANEOUS) ×2
CANNULA SEAL DVNC (CANNULA) IMPLANT
CANNULA SEALS DA VINCI (CANNULA)
CATH FOLEY 2WAY SLVR  5CC 18FR (CATHETERS) ×1
CATH FOLEY 2WAY SLVR 5CC 18FR (CATHETERS) ×1 IMPLANT
CHLORAPREP W/TINT 26ML (MISCELLANEOUS) ×2 IMPLANT
CLIP APPLIE 5 13 M/L LIGAMAX5 (MISCELLANEOUS) ×1 IMPLANT
CLIP LIGATING HEM O LOK PURPLE (MISCELLANEOUS) ×2 IMPLANT
CLIP LIGATING HEMO O LOK GREEN (MISCELLANEOUS) ×2 IMPLANT
CLIP SUT LAPRA TY ABSORB (SUTURE) IMPLANT
CLOTH BEACON ORANGE TIMEOUT ST (SAFETY) ×2 IMPLANT
CORD HIGH FREQUENCY UNIPOLAR (ELECTROSURGICAL) ×2 IMPLANT
CORDS BIPOLAR (ELECTRODE) ×2 IMPLANT
COVER SURGICAL LIGHT HANDLE (MISCELLANEOUS) ×2 IMPLANT
COVER TIP SHEARS 8 DVNC (MISCELLANEOUS) ×1 IMPLANT
COVER TIP SHEARS 8MM DA VINCI (MISCELLANEOUS) ×1
DECANTER SPIKE VIAL GLASS SM (MISCELLANEOUS) IMPLANT
DERMABOND ADVANCED (GAUZE/BANDAGES/DRESSINGS) ×1
DERMABOND ADVANCED .7 DNX12 (GAUZE/BANDAGES/DRESSINGS) ×1 IMPLANT
DRAIN CHANNEL 15F RND FF 3/16 (WOUND CARE) ×2 IMPLANT
DRAPE INCISE IOBAN 66X45 STRL (DRAPES) ×2 IMPLANT
DRAPE LAPAROSCOPIC ABDOMINAL (DRAPES) ×2 IMPLANT
DRAPE LG THREE QUARTER DISP (DRAPES) ×2 IMPLANT
DRAPE TABLE BACK 44X90 PK DISP (DRAPES) ×2 IMPLANT
DRAPE WARM FLUID 44X44 (DRAPE) ×2 IMPLANT
DRESSING SURGICEL FIBRLLR 1X2 (HEMOSTASIS) IMPLANT
DRSG SURGICEL FIBRILLAR 1X2 (HEMOSTASIS)
ELECT REM PT RETURN 9FT ADLT (ELECTROSURGICAL) ×4
ELECTRODE REM PT RTRN 9FT ADLT (ELECTROSURGICAL) ×2 IMPLANT
EVACUATOR SILICONE 100CC (DRAIN) ×2 IMPLANT
FLOSEAL 10ML (HEMOSTASIS) ×2 IMPLANT
GAUZE VASELINE 3X9 (GAUZE/BANDAGES/DRESSINGS) ×2 IMPLANT
GLOVE BIO SURGEON STRL SZ 6.5 (GLOVE) ×2 IMPLANT
GLOVE BIOGEL M STRL SZ7.5 (GLOVE) ×16 IMPLANT
GOWN STRL NON-REIN LRG LVL3 (GOWN DISPOSABLE) ×14 IMPLANT
HEMOSTAT SURGICEL 2X3 (HEMOSTASIS) ×2 IMPLANT
HEMOSTAT SURGICEL 4X8 (HEMOSTASIS) IMPLANT
KIT ACCESSORY DA VINCI DISP (KITS) ×1
KIT ACCESSORY DVNC DISP (KITS) ×1 IMPLANT
KIT BASIN OR (CUSTOM PROCEDURE TRAY) ×2 IMPLANT
NS IRRIG 1000ML POUR BTL (IV SOLUTION) IMPLANT
PENCIL BUTTON HOLSTER BLD 10FT (ELECTRODE) ×2 IMPLANT
POSITIONER SURGICAL ARM (MISCELLANEOUS) IMPLANT
POUCH SPECIMEN RETRIEVAL 10MM (ENDOMECHANICALS) ×2 IMPLANT
SET TUBE IRRIG SUCTION NO TIP (IRRIGATION / IRRIGATOR) ×2 IMPLANT
SOLUTION ANTI FOG 6CC (MISCELLANEOUS) ×2 IMPLANT
SOLUTION ELECTROLUBE (MISCELLANEOUS) ×2 IMPLANT
SPONGE LAP 18X18 X RAY DECT (DISPOSABLE) IMPLANT
SURGIFLO W/THROMBIN 8M KIT (HEMOSTASIS) IMPLANT
SUT ETHILON 3 0 PS 1 (SUTURE) ×2 IMPLANT
SUT MNCRL AB 4-0 PS2 18 (SUTURE) ×4 IMPLANT
SUT MON AB 2-0 SH 27 (SUTURE) IMPLANT
SUT MON AB 2-0 SH27 (SUTURE) IMPLANT
SUT VIC AB 0 CT1 27 (SUTURE) ×2
SUT VIC AB 0 CT1 27XBRD ANTBC (SUTURE) ×1 IMPLANT
SUT VIC AB 0 UR5 27 (SUTURE) IMPLANT
SUT VIC AB 2-0 SH 27 (SUTURE)
SUT VIC AB 2-0 SH 27X BRD (SUTURE) IMPLANT
SUT VIC AB 4-0 RB1 27 (SUTURE)
SUT VIC AB 4-0 RB1 27XBRD (SUTURE) IMPLANT
SUT VICRYL 0 UR6 27IN ABS (SUTURE) ×2 IMPLANT
SYR BULB IRRIGATION 50ML (SYRINGE) IMPLANT
TOWEL OR NON WOVEN STRL DISP B (DISPOSABLE) ×2 IMPLANT
TRAY FOLEY CATH 14FRSI W/METER (CATHETERS) ×2 IMPLANT
TRAY LAP CHOLE (CUSTOM PROCEDURE TRAY) ×2 IMPLANT
TROCAR ENDOPATH XCEL 12X100 BL (ENDOMECHANICALS) ×2 IMPLANT
TROCAR XCEL 12X100 BLDLESS (ENDOMECHANICALS) ×2 IMPLANT
TUBING INSUFFLATION 10FT LAP (TUBING) ×2 IMPLANT
WATER STERILE IRR 1500ML POUR (IV SOLUTION) ×4 IMPLANT

## 2011-07-04 NOTE — Anesthesia Procedure Notes (Addendum)
Procedure Name: Intubation Date/Time: 07/04/2011 11:42 AM Performed by: [place], Aerie Donica C Pre-anesthesia Checklist: Patient identified, Emergency Drugs available, Suction available, Patient being monitored and Timeout performed Patient Re-evaluated:Patient Re-evaluated prior to inductionOxygen Delivery Method: Circle System Utilized Preoxygenation: Pre-oxygenation with 100% oxygen Intubation Type: IV induction Ventilation: Mask ventilation without difficulty Laryngoscope Size: Miller and 3 Grade View: Grade III Tube type: Oral Tube size: 7.5 mm Airway Equipment and Method: stylet Placement Confirmation: ETT inserted through vocal cords under direct vision,  positive ETCO2 and breath sounds checked- equal and bilateral Secured at: 23 cm Tube secured with: Tape Dental Injury: Teeth and Oropharynx as per pre-operative assessment

## 2011-07-04 NOTE — Progress Notes (Signed)
GU post-op check.  Patient doing well post-op.  Pain present but controlled.    Filed Vitals:   07/04/11 1800  BP: 166/69  Pulse: 108  Temp: 97.6 F (36.4 C)  Resp: 20   Gen: NAD, AAO Chest:  Equal effort bilaterally Abdomen: Soft, appropriately TTP, no rebound TTP  Post op hemoglobin 13.8 (14.5)  A/P:  Left robot-assist laparoscopic partial nephrectomy today. -Strict bed rest. -SCD to BLE. -Incentive spirometry. -IV metoprolol for HTN.
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
## 2011-07-04 NOTE — H&P (Signed)
Chief Complaint  Left enhancing renal mass   History of Present Illness       45 year old male found to have an enhancing left renal mass. This measures 3 x 2.5 x 2.1 cm. This is partially exophytic partially endophytic located on the lateral portion of the left kidney between the midpole and lower pole. There is no lymphadenopathy or venous tumor involvement.  There appeared to be 2 left renal arteries that originates very close to each other. There appears to be only one renal vein.  Staging with a chest x-ray was negative except for emphysematous changes. Serum creatinine was 1.19 his liver function tests were normal.  We reviewed options for treatment. At this point he is interested in left laparoscopic robot-assisted partial nephrectomy. We discussed the risks, benefits, alternatives, and likelihood of achieving goals. He wishes to proceed.   Past Medical History Problems  1. History of  Anxiety (Symptom) 300.00 2. History of  Arthritis V13.4 3. History of  Depression 311 4. History of  Esophageal Reflux 530.81 5. History of  Hypogonadism 257.2 6. History of  Renal Insufficiency 593.9  Surgical History Problems  1. History of  Jaw Surgery  Current Meds 1. No Reported Medications  Allergies Medication  1. No Known Drug Allergies  Family History Problems  1. Family history of  Death In The Family Mother age 38 of tracheal cancer 2. Family history of  Family Health Status Number Of Children 2 sons  1 daughter 3. Maternal history of  Tracheal Cancer V16.1  Social History Problems  1. Caffeine Use 12 a day 2. Current Every Day Smoker 305.1 smokes 1 ppd x 30 years 3. Marital History - Single 4. Occupation: Medical illustrator but currently unemployed. 5. Recovering Alcoholic binge drinker.  Review of Systems Skin, eye, cardiovascular, pulmonary, endocrine, gastrointestinal and neurological system(s) were reviewed and pertinent findings if present are noted.  Constitutional:  night sweats and feeling tired (fatigue), but no fever and no recent weight loss.  ENT: no sore throat and no sinus problems.  Hematologic/Lymphatic: no tendency to easily bruise and no swollen glands.  Musculoskeletal: back pain and joint pain.  Psychiatric: anxiety, but no depression.     Physical Exam Constitutional: Well nourished and well developed.  ENT:. The ears and nose are normal in appearance. The oropharynx is normal.  Neck: The appearance of the neck is normal and no neck mass is present.  Pulmonary: No respiratory distress and normal respiratory rhythm and effort.  Cardiovascular: Heart rate and rhythm are normal . No peripheral edema.  Abdomen: The abdomen is soft and nontender. No masses are palpated. The abdomen is no rebound. No CVA tenderness.  Lymphatics: The posterior cervical and supraclavicular nodes are not enlarged or tender.  Skin: Normal skin turgor and no visible rash.  Neuro/Psych:. Mood and affect are appropriate. No focal sensory deficits.    Assessment Renal Cell Carcinoma Left   Plan Proceed as planned with left laparoscopic robot-assisted partial nephrectomy.

## 2011-07-04 NOTE — Anesthesia Preprocedure Evaluation (Signed)
Anesthesia Evaluation  Patient identified by MRN, date of birth, ID band Patient awake    Reviewed: Allergy & Precautions, H&P , NPO status , Patient's Chart, lab work & pertinent test results  Airway Mallampati: II TM Distance: >3 FB Neck ROM: Full    Dental No notable dental hx.    Pulmonary Current Smoker (presently 1 ppd),  clear to auscultation  Pulmonary exam normal       Cardiovascular neg cardio ROS Regular Normal    Neuro/Psych Negative Neurological ROS  Negative Psych ROS   GI/Hepatic negative GI ROS, Neg liver ROS,   Endo/Other  Negative Endocrine ROS  Renal/GU negative Renal ROS  Genitourinary negative   Musculoskeletal negative musculoskeletal ROS (+)   Abdominal   Peds negative pediatric ROS (+)  Hematology negative hematology ROS (+)   Anesthesia Other Findings   Reproductive/Obstetrics negative OB ROS                           Anesthesia Physical Anesthesia Plan  ASA: II  Anesthesia Plan: General   Post-op Pain Management:    Induction: Intravenous  Airway Management Planned: Oral ETT  Additional Equipment:   Intra-op Plan:   Post-operative Plan: Extubation in OR  Informed Consent: I have reviewed the patients History and Physical, chart, labs and discussed the procedure including the risks, benefits and alternatives for the proposed anesthesia with the patient or authorized representative who has indicated his/her understanding and acceptance.   Dental advisory given  Plan Discussed with: CRNA  Anesthesia Plan Comments:         Anesthesia Quick Evaluation

## 2011-07-04 NOTE — Anesthesia Postprocedure Evaluation (Signed)
  Anesthesia Post-op Note  Patient: Kevin [name]  Procedure(s) Performed:  ROBOTIC ASSITED PARTIAL NEPHRECTOMY - Left Partial Nephrectomy  Patient Location: PACU  Anesthesia Type: General  Level of Consciousness: awake and alert   Airway and Oxygen Therapy: Patient Spontanous Breathing  Post-op Pain: mild  Post-op Assessment: Post-op Vital signs reviewed, Patient's Cardiovascular Status Stable, Respiratory Function Stable, Patent Airway and No signs of Nausea or vomiting  Post-op Vital Signs: stable  Complications: No apparent anesthesia complications

## 2011-07-04 NOTE — Transfer of Care (Signed)
Immediate Anesthesia Transfer of Care Note  Patient: Kevin [name]  Procedure(s) Performed:  ROBOTIC ASSITED PARTIAL NEPHRECTOMY - Left Partial Nephrectomy  Patient Location: PACU  Anesthesia Type: General  Level of Consciousness: awake, alert , patient cooperative and responds to stimulation  Airway & Oxygen Therapy: Patient Spontanous Breathing and Patient connected to face mask oxygen  Post-op Assessment: Report given to PACU RN, Post -op Vital signs reviewed and stable and Patient moving all extremities  Post vital signs: Reviewed and stable  Complications: No apparent anesthesia complications

## 2011-07-04 NOTE — Brief Op Note (Signed)
07/04/2011  4:28 PM  PATIENT:  Kevin [name]  45 y.o. male  PRE-OPERATIVE DIAGNOSIS:  left renal mass  POST-OPERATIVE DIAGNOSIS:  Left Renal Mass  PROCEDURE:  Procedure(s): ROBOTIC ASSITED PARTIAL NEPHRECTOMY  SURGEON:  Natalia Leatherwood, MD  PHYSICIAN ASSISTANT:  Heloise Purpura, MD  ASSISTANTS: Pecola Leisure, PA   ANESTHESIA:   local and general  EBL:  Total I/O In: 3000 [I.V.:3000] Out: 450 [Urine:400; Blood:50]  BLOOD ADMINISTERED:none  DRAINS: (1) Jackson-Pratt drain(s) with closed bulb suction in the Left lower abdomen. and Urinary Catheter (Foley)   LOCAL MEDICATIONS USED:  OTHER Exparel (Diluted to 40 mL, all 40 mL used)  SPECIMEN:  Source of Specimen:  Left partial nephrectomy  DISPOSITION OF SPECIMEN:  PATHOLOGY  COUNTS:  YES  TOURNIQUET:  * No tourniquets in log *  DICTATION: .Other Dictation: Dictation Number [id number]  PLAN OF CARE: Admit to inpatient   PATIENT DISPOSITION:  PACU - hemodynamically stable.   Delay start of Pharmacological VTE agent (>24hrs) due to surgical blood loss or risk of bleeding: YES

## 2011-07-05 ENCOUNTER — Inpatient Hospital Stay (HOSPITAL_COMMUNITY): Payer: Medicaid Other

## 2011-07-05 LAB — BASIC METABOLIC PANEL
CO2: 26 mEq/L (ref 19–32)
Calcium: 9.2 mg/dL (ref 8.4–10.5)
Creatinine, Ser: 1.28 mg/dL (ref 0.50–1.35)
GFR calc non Af Amer: 67 mL/min — ABNORMAL LOW (ref >90)
Glucose, Bld: 113 mg/dL — ABNORMAL HIGH (ref 70–99)

## 2011-07-05 MED ORDER — HYDROMORPHONE HCL PF 1 MG/ML IJ SOLN
1.0000 mg | INTRAMUSCULAR | Status: DC | PRN
  Administered 2011-07-07: 1 mg via INTRAVENOUS
  Filled 2011-07-05: qty 1

## 2011-07-05 MED ORDER — ZOLPIDEM TARTRATE 5 MG PO TABS
5.0000 mg | ORAL_TABLET | Freq: Every evening | ORAL | Status: DC | PRN
  Administered 2011-07-05 – 2011-07-07 (×4): 5 mg via ORAL
  Filled 2011-07-05 (×5): qty 1

## 2011-07-05 MED ORDER — HYDROMORPHONE HCL PF 2 MG/ML IJ SOLN
2.0000 mg | INTRAMUSCULAR | Status: DC | PRN
  Administered 2011-07-05 – 2011-07-06 (×4): 2 mg via INTRAVENOUS
  Administered 2011-07-06: 1 mg via INTRAVENOUS
  Administered 2011-07-06 – 2011-07-07 (×8): 2 mg via INTRAVENOUS
  Filled 2011-07-05 (×13): qty 1

## 2011-07-05 NOTE — Progress Notes (Signed)
Urology Progress Note  Subjective:     No acute urologic events overnight.  Patient complains more of back pain from arthritis than abdominal pain. On strict bedrest. Flatus:   [  ] Positive  [ x ] Negative Bowel movement [  ] Positive  [ x ] Negative  Pain: [ x ] Well controlled.  [  ] Needs adjustment.  Objective:  Patient Vitals for the past 24 hrs:  BP Temp Temp src Pulse Resp SpO2 Height Weight  07/05/11 0528 127/74 mmHg 97.9 F (36.6 C) Oral 92  18  97 % - -  07/05/11 0209 124/78 mmHg 98.1 F (36.7 C) Oral 107  16  99 % - -  07/04/11 2131 131/86 mmHg 97.8 F (36.6 C) Oral 105  18  100 % - -  07/04/11 1800 166/69 mmHg 97.6 F (36.4 C) - 108  20  100 % 5\' 6"  (1.676 m) 77.9 kg (171 lb 11.8 oz)  07/04/11 1730 147/98 mmHg 97.6 F (36.4 C) - - - - - -  07/04/11 1720 - - - 107  16  100 % - -  07/04/11 1707 - - - - - 100 % - -  07/04/11 1700 162/103 mmHg - - 100  12  100 % - -  07/04/11 1657 162/100 mmHg - - - - - - -  07/04/11 1645 150/85 mmHg 97.7 F (36.5 C) - 101  11  100 % - -  07/04/11 1632 150/85 mmHg 97.7 F (36.5 C) - - 17  100 % - -  07/04/11 0900 134/75 mmHg 98.7 F (37.1 C) - 104  20  100 % - -    Physical Exam: General:  No acute distress, awake Cardiovascular:    [x]   S1/S2 present, RRR  []   Irregularly irregular Chest:  CTA-B Abdomen:  Incisions C/D/I without erythema.  JP serosanguinous.             [x]  Soft, appropriately TTP  []  Soft, NTTP  []  Soft, appropriately TTP, incision(s) clean/dry/intact  Genitourinary: Foley catheter in place.  Foley:  Clear yellow urine in catheter   I/O:  JP 160 mL I/O last 3 completed shifts: In: 4000 [I.V.:4000] Out: 765 [Urine:675; Drains:40; Blood:50]  Recent Labs  Basename 07/05/11 0500 07/04/11 1714   HGB 13.7 13.8   WBC -- --   PLT -- --    Recent Labs  Basename 07/05/11 0500   NA 140   K 3.9   CL 103   CO2 26   BUN 13   CREATININE 1.28   CALCIUM 9.2   GFRNONAA 67*   GFRAA 77*     No results  found for this basename: PT:2,INR:2,APTT:2 in the last 72 hours   No components found with this basename: ABG:2     Assessment: POD#1 Left robot assisted laparoscopic partial nephrectomy Plan: -Continue bedrest until 16:00 today. -Recheck Hgb 6 hours after ambulation today. -Continue foley catheter; likely discontinue in the morning for voiding trial. -Continue clear liquids; continue bowel regiment. -Continue SCD's and pulmonary hygiene.   Natalia Leatherwood, MD [phone]

## 2011-07-05 NOTE — Progress Notes (Signed)
Chart reviewed and UR completed. 

## 2011-07-06 ENCOUNTER — Inpatient Hospital Stay (HOSPITAL_COMMUNITY): Payer: Medicaid Other

## 2011-07-06 LAB — CBC
HCT: 38.9 % — ABNORMAL LOW (ref 39.0–52.0)
Hemoglobin: 13.9 g/dL (ref 13.0–17.0)
MCH: 31.2 pg (ref 26.0–34.0)
MCV: 87.4 fL (ref 78.0–100.0)
RBC: 4.45 MIL/uL (ref 4.22–5.81)

## 2011-07-06 LAB — DIFFERENTIAL
Eosinophils Absolute: 0 10*3/uL (ref 0.0–0.7)
Eosinophils Relative: 0 % (ref 0–5)
Lymphs Abs: 1.2 10*3/uL (ref 0.7–4.0)
Monocytes Absolute: 1.1 10*3/uL — ABNORMAL HIGH (ref 0.1–1.0)
Monocytes Relative: 8 % (ref 3–12)

## 2011-07-06 LAB — BASIC METABOLIC PANEL
CO2: 26 mEq/L (ref 19–32)
Glucose, Bld: 94 mg/dL (ref 70–99)
Potassium: 3.6 mEq/L (ref 3.5–5.1)
Sodium: 138 mEq/L (ref 135–145)

## 2011-07-06 MED ORDER — PANTOPRAZOLE SODIUM 40 MG IV SOLR
40.0000 mg | INTRAVENOUS | Status: DC
  Administered 2011-07-06 – 2011-07-07 (×2): 40 mg via INTRAVENOUS
  Filled 2011-07-06 (×3): qty 40

## 2011-07-06 MED ORDER — BISACODYL 10 MG RE SUPP
10.0000 mg | Freq: Two times a day (BID) | RECTAL | Status: DC
  Administered 2011-07-06 – 2011-07-08 (×3): 10 mg via RECTAL
  Filled 2011-07-06 (×3): qty 1

## 2011-07-06 MED ORDER — ACETAMINOPHEN 10 MG/ML IV SOLN
1000.0000 mg | Freq: Four times a day (QID) | INTRAVENOUS | Status: AC
  Administered 2011-07-06 (×4): 1000 mg via INTRAVENOUS
  Filled 2011-07-06 (×4): qty 100

## 2011-07-06 MED ORDER — DEXTROSE-NACL 5-0.9 % IV SOLN
INTRAVENOUS | Status: DC
  Administered 2011-07-06 – 2011-07-07 (×4): via INTRAVENOUS

## 2011-07-06 NOTE — Progress Notes (Signed)
Called result of KUB to A Bruning PA- result: adynamic ileus. No new order received. 07/06/2011 12:31 AM [name],Kevin Liotta D

## 2011-07-06 NOTE — Progress Notes (Signed)
Notified Dr Lynnae Sandhoff about patient's cont complain of abdominal pain with  Nausea. New order received: Insert NGT to LIWS.Marland Kitchen12/11/2010 2:30 AM [name],Eunice Winecoff D

## 2011-07-06 NOTE — Progress Notes (Signed)
GU  Patient had large BM late in the morning with some relief of his abdominal discomfort.  Abdominal plain films this morning show an ileus.  Patient states his pain has improved.   PE: Filed Vitals:   07/06/11 1437  BP: 156/85  Pulse: 100  Temp: 99 F (37.2 C)  Resp: 18   CV:  Regular rhythm, tachycardia. Abdomen: Soft.  Diffusely moderately TTP.  Positive voluntary guarding.  No rebound TTP. GU:  Urine draining clear yellow urine.    A/P: Left robot laparoscopic partial nephrectomy. Now with post-op ileus. -Added IV PPI for GI prophylaxis. -Continue to ambulate. -Continue bowel regiment and NG tube.

## 2011-07-06 NOTE — Progress Notes (Signed)
Urology Progress Note  Subjective:     No acute urologic events overnight.  Patient with increased nausea overnight.  Patient also with increased bowel cramping.  Had NG placed with some relief and return of 250 mL of fluid. Ambulation:  [ x ] Positive  [  ] Negative Flatus:   [  ] Positive  [ x ] Negative Bowel movement [  ] Positive  [ x ] Negative    Objective:  Patient Vitals for the past 24 hrs:  BP Temp Temp src Pulse Resp SpO2  07/06/11 0536 146/81 mmHg 99.8 F (37.7 C) Oral 116  16  91 %  07/05/11 2245 136/78 mmHg 98.5 F (36.9 C) Oral 116  20  91 %  07/05/11 1732 154/67 mmHg 98.3 F (36.8 C) Axillary 87  20  93 %  07/05/11 1438 137/79 mmHg 97.9 F (36.6 C) Oral 89  18  98 %  07/05/11 1123 159/77 mmHg 97.9 F (36.6 C) Oral 98  18  98 %    Physical Exam: General:  No acute distress, awake Cardiovascular:  Regular rhythm, mild tachycardia Chest:  CTA-B Abdomen: Mildly distended.  Soft and TTP. Positive BS but hypoactive.  Diffusely TTP. Incisions C/D/I without    erythema; no single port-site more painful than another. Genitourinary: Foley cath draining clear yellow urine.    I/O: JP 70 ml  I/O last 3 completed shifts: In: 6720 [P.O.:820; I.V.:5500; IV Piggyback:400] Out: 7095 [Urine:6850; Drains:195; Blood:50]  Recent Labs  [facility] 07/06/11 0440 07/05/11 2224   HGB 13.9 13.8   WBC 13.5* --   PLT 128* --    Recent Labs  Basename 07/06/11 0440 07/05/11 0500   NA 138 140   K 3.6 3.9   CL 102 103   CO2 26 26   BUN 11 13   CREATININE 1.17 1.28   CALCIUM 9.3 9.2   GFRNONAA 74* 67*   GFRAA 86* 77*     No results found for this basename: PT:2,INR:2,APTT:2 in the last 72 hours   No components found with this basename: ABG:2     Assessment: POD#2 left robot assisted laparoscopic partial nephrectomy.  Now with ileus. Plan: -Repeat KUB this morning. -Place on telemetry to monitor tachycardia. -Dulcolax suppository -Encourage ambulation. -NPO;  continue NG tube.   Natalia Leatherwood, MD [phone]

## 2011-07-07 LAB — CBC
HCT: 34.4 % — ABNORMAL LOW (ref 39.0–52.0)
HCT: 32.2 % — ABNORMAL LOW (ref 39.0–52.0)
Hemoglobin: 12.3 g/dL — ABNORMAL LOW (ref 13.0–17.0)
MCH: 31.1 pg (ref 26.0–34.0)
MCH: 31.8 pg (ref 26.0–34.0)
MCHC: 35.8 g/dL (ref 30.0–36.0)
MCHC: 37 g/dL — ABNORMAL HIGH (ref 30.0–36.0)
MCV: 86.1 fL (ref 78.0–100.0)
RDW: 11.4 % — ABNORMAL LOW (ref 11.5–15.5)
RDW: 11.3 % — ABNORMAL LOW (ref 11.5–15.5)

## 2011-07-07 LAB — DIFFERENTIAL
Basophils Relative: 0 % (ref 0–1)
Eosinophils Absolute: 0.2 10*3/uL (ref 0.0–0.7)
Eosinophils Relative: 3 % (ref 0–5)
Monocytes Absolute: 1 10*3/uL (ref 0.1–1.0)
Monocytes Relative: 11 % (ref 3–12)

## 2011-07-07 MED ORDER — CEPHALEXIN 500 MG PO CAPS
500.0000 mg | ORAL_CAPSULE | Freq: Three times a day (TID) | ORAL | Status: DC
  Administered 2011-07-07 – 2011-07-08 (×5): 500 mg via ORAL
  Filled 2011-07-07 (×9): qty 1

## 2011-07-07 NOTE — Progress Notes (Signed)
Notified on call NP about observed blood tinge gastric drainage form ngt. Order received to clamp NGT for now .......................07/07/2011 c.2:13 AM [name],[name]

## 2011-07-07 NOTE — Progress Notes (Addendum)
Pt c/o headache at 1315, tylenol given.  At 1430 pt c/o worsening headache - no relief obtained from tylenol.  BP elevated at 170s/90s, temp 99.1 orally. Called to notify Dr. Margarita Grizzle, spoke with his nurse at the office and relayed above information.  Will continue to monitor. Ardyth Gal, RN 07/07/2011  Dr. Margarita Grizzle previously returned call, no new orders received.  IV PRN Metoprolol given. CIWA assessed.  Rechecked BP, currently 150s/80s.  Still c/o headache. Will continue to monitor. Ardyth Gal, RN 07/07/2011  [phone]

## 2011-07-07 NOTE — Progress Notes (Signed)
GU  Patient's Hgb returned at 11.8 from 12.3 this morning; was 13.9 yesterday morning.  He is noted to have some mild thrombocytopenia as well.   He has been ambulating.  He was able to void with his catheter out.  No increased output from JP; still serosanguinous.  He states he's been passing a lot of flatus and having BM's.  PE: General: NAD, AAO Neuro: No lightheadedness Abdomen: Soft, no voluntary guarding, appropriately TTP.  JP serosanguinous. CV:  Mild tachycardia at 100  A/P:  Left robot laparoscopic partial nephrectomy.  Recovering from ileus.  Now with asymptomatic drop in hemoglobin.  This could be due to mobilization of fluids with dilution versus bleed from kidney.  Bleed seems less likely as patient is asymptomatic and JP output is not bloody or increased in output. -Bed rest for now. -Continue NPO. -Recheck CBC at 16:00

## 2011-07-07 NOTE — Progress Notes (Signed)
GU  Patient complains of headache that is resolved with BP medication.  His hemoglobin returned stable at 11.9 from 11.8.  Still passing large amount of flatus.  Filed Vitals:   07/07/11 1540  BP: 158/81  Pulse: 90  Temp:   Resp: 18    Gen: NAD Abdomen: Soft, appropriately TTP  A/P:  POD#3 left robot laparoscopic partial nephrectomy -Hemoglobin stable; drop was likely dilutional due to mobilization of fluids with resolution of ileus. -I received the pathology today and gave the patient a copy: renal cell carcinoma with negative margins. -Ambulate. -Saline-lock IV -Clear liquid diet.

## 2011-07-07 NOTE — Progress Notes (Addendum)
Urology Progress Note  Subjective:     No acute urologic events overnight.  Patient passing substantially more flatus with great relief of discomfort.  Patient's abdominal pain greatly improved. Ambulation:  [ x ] Positive  [  ] Negative Flatus:   [ x ] Positive  [  ] Negative Bowel movement [ x ] Positive  [  ] Negative  Pain: [ x ] Well controlled.  [  ] Needs adjustment.  Objective:  Patient Vitals for the past 24 hrs:  BP Temp Temp src Pulse Resp SpO2  07/07/11 0542 125/82 mmHg 99.2 F (37.3 C) Oral 103  18  91 %  07/06/11 2108 148/84 mmHg 97.8 F (36.6 C) Oral 110  18  94 %  07/06/11 1437 156/85 mmHg 99 F (37.2 C) Oral 100  18  91 %  07/06/11 1055 132/80 mmHg 98 F (36.7 C) Oral 113  18  94 %    Physical Exam: General:  No acute distress, awake Cardiovascular:    [x]   S1/S2 present, RRR  []   Irregularly irregular Chest:  CTA-B Abdomen:  Mildly diffusely TTP, no rebound TTP, positive normal active bowel sounds. JP serosanguinous.             []  Soft, appropriately TTP  []  Soft, NTTP  [x]  Soft, appropriately TTP, incision(s) clean/dry/intact  Genitourinary: Foley catheter in place.  I/O: JP 50 mL UO 2325 mL I/O last 3 completed shifts: In: 3660.3 [P.O.:820; I.V.:2130.3; NG/GT:500; IV Piggyback:210] Out: 5635 [Urine:5350; Emesis/NG output:200; Drains:85]  Recent Labs  [facility] 07/07/11 0441 07/06/11 0440   HGB 12.3* 13.9   WBC 9.0 13.5*   PLT 122* 128*    Recent Labs  Basename 07/06/11 0440 07/05/11 0500   NA 138 140   K 3.6 3.9   CL 102 103   CO2 26 26   BUN 11 13   CREATININE 1.17 1.28   CALCIUM 9.3 9.2   GFRNONAA 74* 67*   GFRAA 86* 77*     No results found for this basename: PT:2,INR:2,APTT:2 in the last 72 hours   No components found with this basename: ABG:2     Assessment: POD#2 Left robot laparoscopic partial nephrectomy Plan: -Discontinue telemetry. -Discontinue foley catheter. -Discontinue NG tube. -Continue NPO status except  for medications. -Continue ambulation. -Add keflex for JP drain prophylaxis.   Natalia Leatherwood, MD [phone]

## 2011-07-08 DIAGNOSIS — C649 Malignant neoplasm of unspecified kidney, except renal pelvis: Secondary | ICD-10-CM

## 2011-07-08 DIAGNOSIS — K567 Ileus, unspecified: Secondary | ICD-10-CM

## 2011-07-08 LAB — CBC
Hemoglobin: 12 g/dL — ABNORMAL LOW (ref 13.0–17.0)
MCH: 31.8 pg (ref 26.0–34.0)
MCHC: 36.9 g/dL — ABNORMAL HIGH (ref 30.0–36.0)
Platelets: 126 10*3/uL — ABNORMAL LOW (ref 150–400)
RDW: 11.5 % (ref 11.5–15.5)

## 2011-07-08 LAB — DIFFERENTIAL
Basophils Relative: 0 % (ref 0–1)
Eosinophils Absolute: 0.4 10*3/uL (ref 0.0–0.7)
Monocytes Absolute: 0.7 10*3/uL (ref 0.1–1.0)
Neutrophils Relative %: 73 % (ref 43–77)

## 2011-07-08 MED ORDER — SENNOSIDES-DOCUSATE SODIUM 8.6-50 MG PO TABS: 1.0000 | ORAL_TABLET | Freq: Two times a day (BID) | ORAL | Status: DC

## 2011-07-08 MED ORDER — METOPROLOL TARTRATE 12.5 MG HALF TABLET: 12.5000 mg | ORAL_TABLET | Freq: Two times a day (BID) | ORAL | Status: DC

## 2011-07-08 MED ORDER — OXYCODONE HCL 5 MG PO TABS: 5.0000 mg | ORAL_TABLET | ORAL | Status: AC | PRN

## 2011-07-08 MED ORDER — METOPROLOL TARTRATE 12.5 MG HALF TABLET
12.5000 mg | ORAL_TABLET | Freq: Two times a day (BID) | ORAL | Status: DC
  Administered 2011-07-08: 12.5 mg via ORAL
  Filled 2011-07-08 (×4): qty 1

## 2011-07-08 MED ORDER — PANTOPRAZOLE SODIUM 40 MG PO TBEC
40.0000 mg | DELAYED_RELEASE_TABLET | Freq: Every day | ORAL | Status: DC
  Administered 2011-07-08: 40 mg via ORAL
  Filled 2011-07-08 (×2): qty 1

## 2011-07-08 NOTE — Discharge Summary (Signed)
Physician Discharge Summary  Patient ID: Kevin [name] MRN: [id number] DOB/AGE: 1966/03/02 45 y.o.  Admit date: 07/04/2011 Discharge date: 07/08/2011  Admission Diagnoses:  Discharge Diagnoses:  Active Problems:  * No active hospital problems. *    Discharged Condition: good  Hospital Course:  Patient was admitted after a left robot laparoscopic partial nephrectomy. He was on bed-rest for 24-hours with SCD's in place; his hemoglobin remained stable and he was able to ambulate. He initially developed an ileus.  This resolved with a bowel regiment and ambulation.  He had a drop in his hemoglobin that was felt to be dilutional due to mobilization of fluids with resolution of his ileus.  His hemoglobin remained stable and his JP drain was removed.  He was able to advance his diet to regular and control his pain with PO medications.  His pathology returned as renal cell carcinoma, completely excised.  He had hypertension that was controlled initially with IV then PO metoprolol.  He felt he could be discharged home and I agreed.  Consults: none  Significant Diagnostic Studies: labs: Serial blood work (CBC, H&H, BMP) and radiology: abdominal x-ray showed ileus.  Treatments: IV hydration and surgery: Left robot laparoscopic partial nephrectomy.  Discharge Exam: Blood pressure 144/88, pulse 89, temperature 97.5 F (36.4 C), temperature source Oral, resp. rate 18, height 5\' 6"  (1.676 m), weight 77.9 kg (171 lb 11.8 oz), SpO2 94.00%. See progress note from day of discharge.  Disposition: Home or Self Care  Discharge Orders    Future Orders Please Complete By Expires   Discharge patient        Current Discharge Medication List    START taking these medications   Details  metoprolol tartrate (LOPRESSOR) 12.5 mg TABS Take 0.5 tablets (12.5 mg total) by mouth 2 (two) times daily. Qty: 60 tablet, Refills: 2    oxyCODONE (OXY IR/ROXICODONE) 5 MG immediate release tablet Take 1-2 tablets  (5-10 mg total) by mouth every 4 (four) hours as needed. Qty: 60 tablet, Refills: 0    senna-docusate (SENOKOT-S) 8.6-50 MG per tablet Take 1 tablet by mouth 2 (two) times daily. Qty: 60 tablet, Refills: 3      CONTINUE these medications which have CHANGED   Details  HYDROcodone-acetaminophen (VICODIN) 5-500 MG per tablet Take 1-2 tablets by mouth every 6 (six) hours as needed for pain. For dental pain Qty: 30 tablet, Refills: 0      CONTINUE these medications which have NOT CHANGED   Details  Multiple Vitamins-Minerals (MULTIVITAMINS THER. W/MINERALS) TABS Take 2 tablets by mouth daily. Omega Men Multivitamin        Follow-up Information    Follow up with [facility], NP on 07/19/2011. (10:45-  You will have labs that same day.)    Contact information:   [address] [facility] [address] [facility] [place] [address] [phone]          Signed: Milford Cage 07/08/2011, 5:42 PM

## 2011-07-08 NOTE — Progress Notes (Signed)
Urology Progress Note  Subjective:     No acute urologic events overnight.  Patient has been very ambulatory since he was off of bedrest for the initial 24 hours post surgery.  Tolerated CLD.  Pain controlled with PO medications. Ambulation:  [ x ] Positive  [  ] Negative Flatus:   [ x ] Positive  [  ] Negative Bowel movement [ x ] Positive  [  ] Negative  Pain: [ x ] Well controlled.  [  ] Needs adjustment.  Objective:  Patient Vitals for the past 24 hrs:  BP Temp Temp src Pulse Resp SpO2  07/08/11 0439 145/79 mmHg 98.5 F (36.9 C) Oral 101  18  94 %  07/07/11 2202 138/84 mmHg 98.5 F (36.9 C) Oral 91  18  95 %  07/07/11 1540 158/81 mmHg - - 90  18  93 %  07/07/11 1430 171/92 mmHg 99.1 F (37.3 C) Oral 96  18  92 %    Physical Exam: General:  No acute distress, awake Cardiovascular:    [x]   S1/S2 present, RRR  []   Irregularly irregular Chest:  CTA-B Abdomen:  JP serous.  No rebound TTP.             []  Soft, appropriately TTP  []  Soft, NTTP  [x]  Soft, appropriately TTP, incision(s) clean/dry/intact  Genitourinary: No foley catheter. Foley:  None    I/O last 3 completed shifts: In: 63 [I.V.:3794; NG/GT:700; IV Piggyback:320] Out: 2785 [Urine:2700; Drains:85]  Recent Labs  [facility] 07/08/11 0505 07/07/11 1407   HGB 12.0* 11.9*   WBC 8.8 9.5   PLT 126* 121*    Recent Labs  [facility] 07/06/11 0440   NA 138   K 3.6   CL 102   CO2 26   BUN 11   CREATININE 1.17   CALCIUM 9.3   GFRNONAA 74*   GFRAA 86*     No results found for this basename: PT:2,INR:2,APTT:2 in the last 72 hours   No components found with this basename: ABG:2     Assessment: POD#4 Left robot laparoscopic partial nephrectomy.  Plan: -Ileus resolved.  Advance diet to regular. -Discontinue JP drain. -Patient may shower. -Switch GI PPI to PO from IV. -Discontinue IV metoprolol and start PO 12.5 mg BID    Natalia Leatherwood, MD [phone]

## 2011-07-08 NOTE — Progress Notes (Signed)
Pt left ambulatory with father. Instructions given

## 2011-10-27 ENCOUNTER — Encounter (HOSPITAL_COMMUNITY): Payer: Self-pay | Admitting: *Deleted

## 2011-10-27 ENCOUNTER — Emergency Department (HOSPITAL_COMMUNITY)
Admission: EM | Admit: 2011-10-27 | Discharge: 2011-10-27 | Disposition: A | Discharge status: Home / Self Care | Payer: Medicaid Other | Attending: Emergency Medicine | Admitting: Emergency Medicine

## 2011-10-27 ENCOUNTER — Emergency Department (HOSPITAL_COMMUNITY): Payer: Medicaid Other

## 2011-10-27 DIAGNOSIS — K5730 Diverticulosis of large intestine without perforation or abscess without bleeding: Secondary | ICD-10-CM | POA: Insufficient documentation

## 2011-10-27 DIAGNOSIS — R10812 Left upper quadrant abdominal tenderness: Secondary | ICD-10-CM | POA: Insufficient documentation

## 2011-10-27 DIAGNOSIS — Z85528 Personal history of other malignant neoplasm of kidney: Secondary | ICD-10-CM | POA: Insufficient documentation

## 2011-10-27 DIAGNOSIS — R5383 Other fatigue: Secondary | ICD-10-CM | POA: Insufficient documentation

## 2011-10-27 DIAGNOSIS — R5381 Other malaise: Secondary | ICD-10-CM | POA: Insufficient documentation

## 2011-10-27 DIAGNOSIS — R109 Unspecified abdominal pain: Secondary | ICD-10-CM | POA: Insufficient documentation

## 2011-10-27 DIAGNOSIS — F17200 Nicotine dependence, unspecified, uncomplicated: Secondary | ICD-10-CM | POA: Insufficient documentation

## 2011-10-27 LAB — DIFFERENTIAL
Basophils Absolute: 0 10*3/uL (ref 0.0–0.1)
Basophils Relative: 0 % (ref 0–1)
Lymphocytes Relative: 19 % (ref 12–46)
Neutro Abs: 5.5 10*3/uL (ref 1.7–7.7)
Neutrophils Relative %: 75 % (ref 43–77)

## 2011-10-27 LAB — URINALYSIS, ROUTINE W REFLEX MICROSCOPIC
Leukocytes, UA: NEGATIVE
Nitrite: NEGATIVE
Specific Gravity, Urine: 1.01 (ref 1.005–1.030)
pH: 5.5 (ref 5.0–8.0)

## 2011-10-27 LAB — BASIC METABOLIC PANEL
CO2: 28 mEq/L (ref 19–32)
Chloride: 101 mEq/L (ref 96–112)
GFR calc Af Amer: >90 mL/min (ref >90)
Potassium: 4.1 mEq/L (ref 3.5–5.1)

## 2011-10-27 LAB — CBC
Hemoglobin: 14.5 g/dL (ref 13.0–17.0)
MCHC: 35.8 g/dL (ref 30.0–36.0)
RDW: 12.3 % (ref 11.5–15.5)
WBC: 7.3 10*3/uL (ref 4.0–10.5)

## 2011-10-27 MED ORDER — IOHEXOL 300 MG/ML  SOLN
100.0000 mL | Freq: Once | INTRAMUSCULAR | Status: AC | PRN
  Administered 2011-10-27: 100 mL via INTRAVENOUS

## 2011-10-27 MED ORDER — SODIUM CHLORIDE 0.9 % IV BOLUS (SEPSIS)
500.0000 mL | Freq: Once | INTRAVENOUS | Status: AC
  Administered 2011-10-27: 500 mL via INTRAVENOUS

## 2011-10-27 MED ORDER — SODIUM CHLORIDE 0.9 % IV SOLN
INTRAVENOUS | Status: DC
  Administered 2011-10-27: 500 mL via INTRAVENOUS

## 2011-10-27 MED ORDER — OXYCODONE-ACETAMINOPHEN 5-325 MG PO TABS: 1.0000 | ORAL_TABLET | Freq: Four times a day (QID) | ORAL | Status: AC | PRN

## 2011-10-27 NOTE — Discharge Instructions (Signed)
Flank Pain Flank pain refers to pain that is located on the side of the body between the upper abdomen and the back. It can be caused by many things. CAUSES  Some of the more common causes of flank pain include:  Muscle strain.   Muscle spasms.   A disease of your spine (vertebral disk disease).   A lung infection (pneumonia).   Fluid around your lungs (pulmonary edema).   A kidney infection.   Kidney stones.   A very painful skin rash on only one side of your body (shingles).   Gallbladder disease.  DIAGNOSIS  Blood tests, urine tests, and X-rays may help your caregiver determine what is wrong. TREATMENT  The treatment of pain depends on the cause. Your caregiver will determine what treatment will work best for you. HOME CARE INSTRUCTIONS   Home care will depend on the cause of your pain.   Some medications may help relieve the pain. Take medication for relief of pain as directed by your caregiver.   Tell your caregiver about any changes in your pain.   Follow up with your caregiver.  SEEK IMMEDIATE MEDICAL CARE IF:   Your pain is not controlled with medication.   The pain increases.   You have abdominal pain.   You have shortness of breath.   You have persistent nausea or vomiting.   You have swelling in your abdomen.   You feel faint or pass out.   You have a temperature by mouth above 102 F (38.9 C), not controlled by medicine.  MAKE SURE YOU:   Understand these instructions.   Will watch your condition.   Will get help right away if you are not doing well or get worse.  Document Released: 09/08/2005 Document Revised: 07/07/2011 Document Reviewed: 01/02/2010 [employer] Patient Information 2012 [employer]. 

## 2011-10-27 NOTE — ED Provider Notes (Signed)
History     CSN: [id number]  Arrival date & time 10/27/11  1524   First MD Initiated Contact with Patient 10/27/11 1658      Chief Complaint  Patient presents with  . Flank Pain    (Consider location/radiation/quality/duration/timing/severity/associated sxs/prior treatment) Patient is a 46 y.o. male presenting with flank pain. The history is provided by the patient.  Flank Pain This is a recurrent problem. Associated symptoms include abdominal pain. Pertinent negatives include no chest pain, no headaches and no shortness of breath.   patient has had her left flank pain for 2 weeks. He had a left partial nephrectomy due to cancer in December 2012. He states his pain is like pain that he was having shortly after his nephrectomy he states he has increased his workouts and has had increased pain with it. No urinary changes. No fevers. No lightheadedness or dizziness. He also states a week ago he had one episode of blood in stool. No weight loss. No shortness of breath. No diarrhea.  Past Medical History  Diagnosis Date  . Left renal mass     Past Surgical History  Procedure Date  . Mandible fracture surgery age 39  . Kidney surgery     Family History  Problem Relation Age of Onset  . Cancer Mother     History  Substance Use Topics  . Smoking status: Current Everyday Smoker -- 1.0 packs/day for 31 years  . Smokeless tobacco: Never Used  . Alcohol Use: No      Review of Systems  Constitutional: Negative for activity change and appetite change.  HENT: Negative for neck stiffness.   Eyes: Negative for pain.  Respiratory: Negative for chest tightness and shortness of breath.   Cardiovascular: Negative for chest pain and leg swelling.  Gastrointestinal: Positive for abdominal pain. Negative for nausea, vomiting and diarrhea.  Genitourinary: Positive for flank pain.  Musculoskeletal: Negative for back pain.  Skin: Negative for rash.  Neurological: Negative for weakness,  numbness and headaches.  Psychiatric/Behavioral: Negative for behavioral problems.    Allergies  Review of patient's allergies indicates no known allergies.  Home Medications   Current Outpatient Rx  Name Route Sig Dispense Refill  . IBUPROFEN 800 MG PO TABS Oral Take 800 mg by mouth 6 (six) times daily. AS NEEDED FOR ARTHRITIS IN BACK    . THERA M PLUS PO TABS Oral Take 2 tablets by mouth daily. Omega Men Multivitamin     . HYDROCODONE-ACETAMINOPHEN 5-500 MG PO TABS Oral Take 1-2 tablets by mouth every 6 (six) hours as needed for pain. For dental pain 30 tablet 0  . METOPROLOL TARTRATE 12.5 MG HALF TABLET Oral Take 0.5 tablets (12.5 mg total) by mouth 2 (two) times daily. 60 tablet 2    Follow up with your PCP regarding your high blood  ...  . OXYCODONE-ACETAMINOPHEN 5-325 MG PO TABS Oral Take 1-2 tablets by mouth every 6 (six) hours as needed for pain. 10 tablet 0  . SENNOSIDES-DOCUSATE SODIUM 8.6-50 MG PO TABS Oral Take 1 tablet by mouth 2 (two) times daily. 60 tablet 3    BP 140/87  Pulse 74  Temp(Src) 98.7 F (37.1 C) (Oral)  Resp 18  Ht 5\' 6"  (1.676 m)  Wt 170 lb (77.111 kg)  BMI 27.44 kg/m2  SpO2 100%  Physical Exam  Nursing note and vitals reviewed. Constitutional: He is oriented to person, place, and time. He appears well-developed and well-nourished.  HENT:  Head: Normocephalic and atraumatic.  Eyes: EOM are normal. Pupils are equal, round, and reactive to light.  Neck: Normal range of motion. Neck supple.  Cardiovascular: Normal rate, regular rhythm and normal heart sounds.   No murmur heard. Pulmonary/Chest: Effort normal and breath sounds normal.  Abdominal: Soft. Bowel sounds are normal. He exhibits no distension and no mass. There is no tenderness. There is no rebound and no guarding.       Mild left upper abdomen tenderness without rebound or guarding. Scars from previous laparoscopic surgery  Genitourinary:       No CVA tenderness  Musculoskeletal: Normal  range of motion. He exhibits no edema.  Neurological: He is alert and oriented to person, place, and time. No cranial nerve deficit.  Skin: Skin is warm and dry.  Psychiatric: He has a normal mood and affect.    ED Course  Procedures (including critical care time)  Labs Reviewed  BASIC METABOLIC PANEL - Abnormal; Notable for the following:    Glucose, Bld 111 (*)    GFR calc non Af Amer 89 (*)    All other components within normal limits  CBC  DIFFERENTIAL  URINALYSIS, ROUTINE W REFLEX MICROSCOPIC   Dg Chest 2 View  10/27/2011  *RADIOLOGY REPORT*  Clinical Data: 46 year old male with weakness.  History of renal cell carcinoma and emphysema.  CHEST - 2 VIEW  Comparison: 05/06/2011 and 09/17/2010  Findings: The cardiomediastinal silhouette is stable. Minimal prominence of the left hilar region is unchanged. There is no evidence of focal airspace disease, pulmonary edema, suspicious pulmonary nodule/mass, pleural effusion, or pneumothorax. No acute bony abnormalities are identified.  IMPRESSION: No evidence of acute cardiopulmonary disease.  Original Report Authenticated By: Rosendo Gros, M.D.   Ct Abdomen Pelvis W Contrast  10/27/2011  *RADIOLOGY REPORT*  Clinical Data: Left flank pain.  Status post partial left nephrectomy for renal cell carcinoma.  CT ABDOMEN AND PELVIS WITH CONTRAST  Technique:  Multidetector CT imaging of the abdomen and pelvis was performed following the standard protocol during bolus administration of intravenous contrast.  Contrast: OMNIPAQUE IOHEXOL 300 MG/ML IJ SOLN  Comparison: 06/03/2011.  Findings: Surgical absence of a small portion of the lower pole of the left kidney.  Otherwise, normal appearing kidneys, ureters and urinary bladder.  No calculi or hydronephrosis.  No masses or fluid collections.  Multiple descending colon diverticula without evidence of diverticulitis.  Normal appearing retrocecal appendix in the right mid abdomen.  No gastric or small  bowel abnormalities.  No enlarged lymph nodes.  Surgical clips medial to the left kidney.  Normal sized prostate gland with mild central calcification. Unremarkable liver, spleen, pancreas, gallbladder and adrenal glands.  Clear lung bases.  Lumbar and lower thoracic spine degenerative changes.  IMPRESSION:  1.  Interval partial left nephrectomy with absence of the previously demonstrated left renal mass. 2.  No evidence of tumor recurrence or adenopathy. 3.  Descending colon diverticulosis.  Original Report Authenticated By: Darrol Angel, M.D.     1. Flank pain       MDM  Left flank pain. In December he had a partial nephrectomy. He states the pain feels similar to pain he was having after surgery. Her lab work and urinalysis are reassuring. CT is reassuring without malignancy or fluid collection. He'll be discharged home to follow with urology        Juliet Rude. Rubin Payor, MD 10/27/11 2038

## 2011-10-27 NOTE — ED Notes (Signed)
Pain lt flank for 2 weeks, blood in stool  1 week ago.  Had  Part of lt   kidney removed due to cancer in December 2012

## 2011-10-27 NOTE — ED Notes (Signed)
Pt alert & oriented x4, stable gait. Pt given discharge instructions, paperwork & prescription(s). Patient instructed to stop at the registration desk to finish any additional paperwork. pt verbalized understanding. Pt left department w/ no further questions.  

## 2011-12-14 ENCOUNTER — Emergency Department (HOSPITAL_COMMUNITY)
Admission: EM | Admit: 2011-12-14 | Discharge: 2011-12-14 | Disposition: A | Discharge status: Home / Self Care | Payer: Medicaid Other | Attending: Emergency Medicine | Admitting: Emergency Medicine

## 2011-12-14 ENCOUNTER — Encounter (HOSPITAL_COMMUNITY): Payer: Self-pay

## 2011-12-14 ENCOUNTER — Emergency Department (HOSPITAL_COMMUNITY): Payer: Medicaid Other

## 2011-12-14 DIAGNOSIS — S335XXA Sprain of ligaments of lumbar spine, initial encounter: Secondary | ICD-10-CM | POA: Insufficient documentation

## 2011-12-14 DIAGNOSIS — S39012A Strain of muscle, fascia and tendon of lower back, initial encounter: Secondary | ICD-10-CM

## 2011-12-14 DIAGNOSIS — Z8739 Personal history of other diseases of the musculoskeletal system and connective tissue: Secondary | ICD-10-CM | POA: Insufficient documentation

## 2011-12-14 DIAGNOSIS — Z79899 Other long term (current) drug therapy: Secondary | ICD-10-CM | POA: Insufficient documentation

## 2011-12-14 DIAGNOSIS — X503XXA Overexertion from repetitive movements, initial encounter: Secondary | ICD-10-CM | POA: Insufficient documentation

## 2011-12-14 HISTORY — DX: Unspecified osteoarthritis, unspecified site: M19.90

## 2011-12-14 MED ORDER — OXYCODONE-ACETAMINOPHEN 5-325 MG PO TABS
2.0000 | ORAL_TABLET | Freq: Once | ORAL | Status: AC
  Administered 2011-12-14: 2 via ORAL
  Filled 2011-12-14: qty 2

## 2011-12-14 MED ORDER — OXYCODONE-ACETAMINOPHEN 5-325 MG PO TABS: 1.0000 | ORAL_TABLET | ORAL | Status: AC | PRN

## 2011-12-14 MED ORDER — CYCLOBENZAPRINE HCL 5 MG PO TABS: 5.0000 mg | ORAL_TABLET | Freq: Three times a day (TID) | ORAL | Status: AC | PRN

## 2011-12-14 MED ORDER — CYCLOBENZAPRINE HCL 10 MG PO TABS
10.0000 mg | ORAL_TABLET | Freq: Once | ORAL | Status: AC
  Administered 2011-12-14: 10 mg via ORAL
  Filled 2011-12-14: qty 1

## 2011-12-14 NOTE — ED Provider Notes (Signed)
History     CSN: [id number]  Arrival date & time 12/14/11  1014   First MD Initiated Contact with Patient 12/14/11 1102      Chief Complaint  Patient presents with  . Back Pain    (Consider location/radiation/quality/duration/timing/severity/associated sxs/prior treatment) Patient is a 46 y.o. male presenting with back pain. The history is provided by the patient.  Back Pain  This is a recurrent problem. The current episode started more than 2 days ago. The problem occurs constantly. The problem has not changed since onset.The pain is associated with lifting heavy objects (Patient was weightlifting 5 days ago and has had increased bilateral lower lumbar pain since.). The pain is present in the lumbar spine. The quality of the pain is described as stabbing. The pain radiates to the right thigh. The pain is at a severity of 5/10. The pain is moderate. The symptoms are aggravated by bending, certain positions and twisting. Pertinent negatives include no chest pain, no fever, no numbness, no headaches, no abdominal pain, no abdominal swelling, no bowel incontinence, no perianal numbness, no dysuria, no paresthesias, no paresis, no tingling and no weakness. He has tried NSAIDs for the symptoms. The treatment provided no relief. Risk factors include a history of cancer (He does have a history of left renal cancer with nephrectomy.).    Past Medical History  Diagnosis Date  . Left renal mass   . Arthritis     Past Surgical History  Procedure Date  . Mandible fracture surgery age 27  . Kidney surgery     Family History  Problem Relation Age of Onset  . Cancer Mother     History  Substance Use Topics  . Smoking status: Current Everyday Smoker -- 1.0 packs/day for 31 years  . Smokeless tobacco: Never Used  . Alcohol Use: No      Review of Systems  Constitutional: Negative for fever.  HENT: Negative for congestion, sore throat and neck pain.   Eyes: Negative.   Respiratory:  Negative for chest tightness and shortness of breath.   Cardiovascular: Negative for chest pain.  Gastrointestinal: Negative for nausea, abdominal pain and bowel incontinence.  Genitourinary: Negative.  Negative for dysuria.  Musculoskeletal: Positive for back pain. Negative for joint swelling and arthralgias.  Skin: Negative.  Negative for rash and wound.  Neurological: Negative for dizziness, tingling, weakness, light-headedness, numbness, headaches and paresthesias.  Hematological: Negative.   Psychiatric/Behavioral: Negative.     Allergies  Review of patient's allergies indicates no known allergies.  Home Medications   Current Outpatient Rx  Name Route Sig Dispense Refill  . CYCLOBENZAPRINE HCL 5 MG PO TABS Oral Take 1 tablet (5 mg total) by mouth 3 (three) times daily as needed for muscle spasms. 15 tablet 0  . HYDROCODONE-ACETAMINOPHEN 5-500 MG PO TABS Oral Take 1-2 tablets by mouth every 6 (six) hours as needed for pain. For dental pain 30 tablet 0  . IBUPROFEN 800 MG PO TABS Oral Take 800 mg by mouth 6 (six) times daily. AS NEEDED FOR ARTHRITIS IN BACK    . METOPROLOL TARTRATE 12.5 MG HALF TABLET Oral Take 0.5 tablets (12.5 mg total) by mouth 2 (two) times daily. 60 tablet 2    Follow up with your PCP regarding your high blood  ...  . THERA M PLUS PO TABS Oral Take 2 tablets by mouth daily. Omega Men Multivitamin     . OXYCODONE-ACETAMINOPHEN 5-325 MG PO TABS Oral Take 1 tablet by mouth every 4 (four)  hours as needed for pain. 20 tablet 0  . SENNOSIDES-DOCUSATE SODIUM 8.6-50 MG PO TABS Oral Take 1 tablet by mouth 2 (two) times daily. 60 tablet 3    BP 134/91  Pulse 86  Temp(Src) 97.7 F (36.5 C) (Oral)  Resp 18  Ht 5\' 6"  (1.676 m)  Wt 165 lb (74.844 kg)  BMI 26.63 kg/m2  SpO2 100%  Physical Exam  Nursing note and vitals reviewed. Constitutional: He appears well-developed and well-nourished.  HENT:  Head: Normocephalic.  Eyes: Conjunctivae are normal.  Neck: Normal  range of motion. Neck supple.  Cardiovascular: Normal rate and intact distal pulses.        Pedal pulses normal.  Pulmonary/Chest: Effort normal.  Abdominal: Soft. Bowel sounds are normal. He exhibits no distension and no mass.  Musculoskeletal: Normal range of motion. He exhibits no edema.       Lumbar back: He exhibits tenderness. He exhibits no swelling, no edema and no spasm.  Neurological: He is alert. He has normal strength. He displays no atrophy and no tremor. No sensory deficit. Gait normal.  Reflex Scores:      Patellar reflexes are 2+ on the right side and 2+ on the left side.      Achilles reflexes are 2+ on the right side and 2+ on the left side.      No strength deficit noted in hip and knee flexor and extensor muscle groups.  Ankle flexion and extension intact.  Skin: Skin is warm and dry.  Psychiatric: He has a normal mood and affect.    ED Course  Procedures (including critical care time)  Labs Reviewed - No data to display Dg Lumbar Spine Complete  12/14/2011  *RADIOLOGY REPORT*  Clinical Data: Back pain.  LUMBAR SPINE - COMPLETE 4+ VIEW  Comparison: C T 10/27/2011  Findings: There is slight levoscoliosis.  Mild anterior and lateral spurring.  Early disc space narrowing in the lower lumbar spine. No fracture or malalignment. SI joints are symmetric and unremarkable.  IMPRESSION: Mild degenerative changes as above.  No acute findings.  Original Report Authenticated By: Cyndie Chime, M.D.     1. Lumbar strain       MDM  X-rays reviewed prior to discharge home and negative for any evidence of bony metastases.  Patient was prescribed oxycodone and Flexeril.  Encouraged rest, heating pad, minimize any activity that worsens pain.  Recheck by PCP if not improved over the next week.  No neuro deficit on exam or by history to suggest emergent or surgical presentation.           Burgess Amor, PA 12/14/11 1747

## 2011-12-14 NOTE — ED Notes (Signed)
Pt states he was lifting weights about 5 days ago and hurt his lower back. Pt states the pain is predominantly in the R side and radiated to his hip.

## 2011-12-14 NOTE — ED Notes (Signed)
Pt states he has been lifting weights and hurt his back again

## 2011-12-14 NOTE — Discharge Instructions (Signed)
Lumbosacral Strain Lumbosacral strain is one of the most common causes of back pain. There are many causes of back pain. Most are not serious conditions. CAUSES  Your backbone (spinal column) is made up of 24 main vertebral bodies, the sacrum, and the coccyx. These are held together by muscles and tough, fibrous tissue (ligaments). Nerve roots pass through the openings between the vertebrae. A sudden move or injury to the back may cause injury to, or pressure on, these nerves. This may result in localized back pain or pain movement (radiation) into the buttocks, down the leg, and into the foot. Sharp, shooting pain from the buttock down the back of the leg (sciatica) is frequently associated with a ruptured (herniated) disk. Pain may be caused by muscle spasm alone. Your caregiver can often find the cause of your pain by the details of your symptoms and an exam. In some cases, you may need tests (such as X-rays). Your caregiver will work with you to decide if any tests are needed based on your specific exam. HOME CARE INSTRUCTIONS   Avoid an underactive lifestyle. Active exercise, as directed by your caregiver, is your greatest weapon against back pain.   Avoid hard physical activities (tennis, racquetball, waterskiing) if you are not in proper physical condition for it. This may aggravate or create problems.   If you have a back problem, avoid sports requiring sudden body movements. Swimming and walking are generally safer activities.   Maintain good posture.   Avoid becoming overweight (obese).   Use bed rest for only the most extreme, sudden (acute) episode. Your caregiver will help you determine how much bed rest is necessary.   For acute conditions, you may put ice on the injured area.   Put ice in a plastic bag.   Place a towel between your skin and the bag.   Leave the ice on for 15 to 20 minutes at a time, every 2 hours, or as needed.   After you are improved and more active, it  may help to apply heat for 30 minutes before activities.  See your caregiver if you are having pain that lasts longer than expected. Your caregiver can advise appropriate exercises or therapy if needed. With conditioning, most back problems can be avoided. SEEK IMMEDIATE MEDICAL CARE IF:   You have numbness, tingling, weakness, or problems with the use of your arms or legs.   You experience severe back pain not relieved with medicines.   There is a change in bowel or bladder control.   You have increasing pain in any area of the body, including your belly (abdomen).   You notice shortness of breath, dizziness, or feel faint.   You feel sick to your stomach (nauseous), are throwing up (vomiting), or become sweaty.   You notice discoloration of your toes or legs, or your feet get very cold.   Your back pain is getting worse.   You have a fever.  MAKE SURE YOU:   Understand these instructions.   Will watch your condition.   Will get help right away if you are not doing well or get worse.  Document Released: 04/27/2005 Document Revised: 07/07/2011 Document Reviewed: 10/17/2008 Exit[employer] Patient Information 2012 [employer].    Do not drive within 4 hours of taking oxycodone as this will make you drowsy.  Avoid lifting,  Bending,  Twisting or any other activity that worsens your pain over the next week.  Apply an  icepack  to your lower back   for 10-15 minutes every 2 hours for the next 2 days.  You should get rechecked if your symptoms are not better over the next 5 days,  Or you develop increased pain,  Weakness in your leg(s) or loss of bladder or bowel function - these are symptoms of a worse injury.   

## 2011-12-19 NOTE — ED Provider Notes (Signed)
Medical screening examination/treatment/procedure(s) were performed by non-physician practitioner and as supervising physician I was immediately available for consultation/collaboration.   Aland Chestnutt L Onix Jumper, MD 12/19/11 0724 

## 2012-01-09 ENCOUNTER — Encounter (HOSPITAL_COMMUNITY): Payer: Self-pay

## 2012-01-09 ENCOUNTER — Emergency Department (HOSPITAL_COMMUNITY)
Admission: EM | Admit: 2012-01-09 | Discharge: 2012-01-09 | Disposition: A | Discharge status: Home / Self Care | Payer: Medicaid Other | Attending: Emergency Medicine | Admitting: Emergency Medicine

## 2012-01-09 DIAGNOSIS — M129 Arthropathy, unspecified: Secondary | ICD-10-CM | POA: Insufficient documentation

## 2012-01-09 DIAGNOSIS — L0291 Cutaneous abscess, unspecified: Secondary | ICD-10-CM

## 2012-01-09 DIAGNOSIS — L02219 Cutaneous abscess of trunk, unspecified: Secondary | ICD-10-CM | POA: Insufficient documentation

## 2012-01-09 DIAGNOSIS — Z87891 Personal history of nicotine dependence: Secondary | ICD-10-CM | POA: Insufficient documentation

## 2012-01-09 MED ORDER — HYDROCODONE-ACETAMINOPHEN 5-325 MG PO TABS: ORAL_TABLET | ORAL | Status: AC

## 2012-01-09 MED ORDER — DOXYCYCLINE HYCLATE 100 MG PO TABS: 100.0000 mg | ORAL_TABLET | Freq: Once | ORAL | Status: DC

## 2012-01-09 MED ORDER — LIDOCAINE HCL (PF) 1 % IJ SOLN
5.0000 mL | Freq: Once | INTRAMUSCULAR | Status: DC
  Filled 2012-01-09: qty 5

## 2012-01-09 MED ORDER — DOXYCYCLINE HYCLATE 100 MG PO TABS
ORAL_TABLET | ORAL | Status: AC
  Administered 2012-01-09: 100 mg
  Filled 2012-01-09: qty 1

## 2012-01-09 MED ORDER — DOXYCYCLINE HYCLATE 100 MG PO CAPS: 100.0000 mg | ORAL_CAPSULE | Freq: Two times a day (BID) | ORAL | Status: AC

## 2012-01-09 NOTE — ED Provider Notes (Signed)
History     CSN: [id number]  Arrival date & time 01/09/12  1139   First MD Initiated Contact with Patient 01/09/12 1208      Chief Complaint  Patient presents with  . Abscess    (Consider location/radiation/quality/duration/timing/severity/associated sxs/prior treatment) Patient is a 46 y.o. male presenting with abscess. The history is provided by the patient.  Abscess  This is a new problem. The current episode started more than one week ago. The onset was gradual. The problem occurs continuously. The problem has been gradually worsening. The abscess is present on the back. The problem is mild. The abscess is characterized by painfulness, redness and swelling. It is unknown what he was exposed to. Pertinent negatives include no decrease in physical activity, no fever, no vomiting and no sore throat. His past medical history does not include skin abscesses in family. There were no sick contacts.    Past Medical History  Diagnosis Date  . Left renal mass   . Arthritis     Past Surgical History  Procedure Date  . Mandible fracture surgery age 64  . Kidney surgery     Family History  Problem Relation Age of Onset  . Cancer Mother     History  Substance Use Topics  . Smoking status: Former Smoker -- 1.0 packs/day for 31 years  . Smokeless tobacco: Never Used  . Alcohol Use: No      Review of Systems  Constitutional: Negative for fever and chills.  HENT: Negative for sore throat.   Gastrointestinal: Negative for nausea and vomiting.  Musculoskeletal: Negative for joint swelling and arthralgias.  Skin: Positive for color change.       Abscess   Neurological: Negative for dizziness and weakness.  Hematological: Negative for adenopathy.  All other systems reviewed and are negative.    Allergies  Review of patient's allergies indicates no known allergies.  Home Medications   Current Outpatient Rx  Name Route Sig Dispense Refill  . GEMFIBROZIL 600 MG PO TABS  Oral Take 600 mg by mouth 2 (two) times daily.    . IBUPROFEN 800 MG PO TABS Oral Take 800 mg by mouth 6 (six) times daily. AS NEEDED FOR ARTHRITIS IN BACK    . METOPROLOL TARTRATE 12.5 MG HALF TABLET Oral Take 0.5 tablets (12.5 mg total) by mouth 2 (two) times daily. 60 tablet 2    Follow up with your PCP regarding your high blood  ...  . THERA M PLUS PO TABS Oral Take 2 tablets by mouth daily. Omega Men Multivitamin     . SERTRALINE HCL 50 MG PO TABS Oral Take 50 mg by mouth daily.      BP 118/80  Pulse 67  Temp(Src) 98.6 F (37 C) (Oral)  Resp 18  Ht 5\' 6"  (1.676 m)  Wt 165 lb (74.844 kg)  BMI 26.63 kg/m2  SpO2 100%  Physical Exam  Nursing note and vitals reviewed. Constitutional: He is oriented to person, place, and time. He appears well-developed and well-nourished. No distress.  HENT:  Head: Normocephalic and atraumatic.  Cardiovascular: Normal rate, regular rhythm and normal heart sounds.   Pulmonary/Chest: Effort normal and breath sounds normal.  Musculoskeletal:       Thoracic back: He exhibits tenderness and swelling. He exhibits normal range of motion, no bony tenderness, no edema, no deformity, no laceration, no spasm and normal pulse.       Back:  Neurological: He is alert and oriented to person, place, and time.  He exhibits normal muscle tone. Coordination normal.  Skin: Skin is warm. There is erythema.       Abscess to the mid upper back.      ED Course  Procedures (including critical care time)      MDM     INCISION AND DRAINAGE Performed by: Pauline Aus L. Consent: Verbal consent obtained. Risks and benefits: risks, benefits and alternatives were discussed Type: abscess  Body area: upper mid back Anesthesia: local infiltration  Local anesthetic: lidocaine 1%w/o epinephrine  Anesthetic total: 3 ml  Complexity: complex Blunt dissection to break up loculations  Drainage: purulent  Drainage amount: moderate  Packing material: 1/4 in  iodoform gauze  Patient tolerance: Patient tolerated the procedure well with no immediate complications.      Patient / Family / Caregiver understand and agree with initial ED impression and plan with expectations set for ED visit. Pt stable in ED with no significant deterioration in condition. Pt feels improved after observation and/or treatment in ED.    Agrees to return here in 2 days for recheck and packing removal.    Doxy, norco #24  prescribed  Addisen Chappelle L. [place], [place] 01/09/12 2112

## 2012-01-09 NOTE — Discharge Instructions (Signed)

## 2012-01-09 NOTE — ED Notes (Signed)
Pt reports has abscess to center of upper back x 3 weeks.  Says boil is getting bigger and is painful.

## 2012-01-09 NOTE — ED Notes (Signed)
Alert, talking,NAD, Has half dollar sized swollen area to T spine area, present app 1 month . Pt say started a a pimple, but has increased in size and become painful.  Area is red and tender. No area of drainage or d/c seen.

## 2012-01-11 ENCOUNTER — Encounter (HOSPITAL_COMMUNITY): Payer: Self-pay | Admitting: *Deleted

## 2012-01-11 ENCOUNTER — Emergency Department (HOSPITAL_COMMUNITY)
Admission: EM | Admit: 2012-01-11 | Discharge: 2012-01-11 | Disposition: A | Discharge status: Home / Self Care | Payer: Medicaid Other | Attending: Emergency Medicine | Admitting: Emergency Medicine

## 2012-01-11 DIAGNOSIS — Z4801 Encounter for change or removal of surgical wound dressing: Secondary | ICD-10-CM | POA: Insufficient documentation

## 2012-01-11 DIAGNOSIS — L0291 Cutaneous abscess, unspecified: Secondary | ICD-10-CM

## 2012-01-11 DIAGNOSIS — Z87891 Personal history of nicotine dependence: Secondary | ICD-10-CM | POA: Insufficient documentation

## 2012-01-11 NOTE — Discharge Instructions (Signed)
Abscess An abscess (boil or furuncle) is an infected area under your skin. This area is filled with yellowish white fluid (pus). HOME CARE   Only take medicine as told by your doctor.   Keep the skin clean around your abscess. Keep clothes that may touch the abscess clean.   Change any bandages (dressings) as told by your doctor.   Avoid direct skin contact with other people. The infection can spread by skin contact with others.   Practice good hygiene and do not share personal care items.   Do not share athletic equipment, towels, or whirlpools. Shower after every practice or work out session.   If a draining area cannot be covered:   Do not play sports.   Children should not go to daycare until the wound has healed or until fluid (drainage) stops coming out of the wound.   See your doctor for a follow-up visit as told.  GET HELP RIGHT AWAY IF:   There is more pain, puffiness (swelling), and redness in the wound site.   There is fluid or bleeding from the wound site.   You have muscle aches, chills, fever, or feel sick.   You or your child has a temperature by mouth above 102 F (38.9 C), not controlled by medicine.   Your baby is older than 3 months with a rectal temperature of 102 F (38.9 C) or higher.  MAKE SURE YOU:   Understand these instructions.   Will watch your condition.   Will get help right away if you are not doing well or get worse.  Document Released: 01/04/2008 Document Revised: 07/07/2011 Document Reviewed: 01/04/2008 [employer] Patient Information 2012 [employer]. 

## 2012-01-11 NOTE — ED Provider Notes (Signed)
History  This chart was scribed for Kevin Gaskins, MD by Bennett Scrape. This patient was seen in room APA03/APA03 and the patient's care was started at 9:41AM.   CSN: [id number]  Arrival date & time 01/11/12  7829   First MD Initiated Contact with Patient 01/11/12 0941      Chief Complaint  Patient presents with  . Wound Check    Patient is a 46 y.o. male presenting with wound check. The history is provided by the patient. No language interpreter was used.  Wound Check  He was treated in the ED 2 to 3 days ago. Previous treatment in the ED includes I&D of abscess and oral antibiotics. Treatments since wound repair include oral antibiotics. Fever duration: no fever. There has been bloody discharge from the wound. The redness has improved. The swelling has improved. The pain has improved.    Kevin [name] is a 46 y.o. male who presents to the Emergency Department for a wound check after having an abscess to the center of his back incised and drained in this ED 2 days ago. The pain is worse with moving and pressure but has not worsened. He states that he has been taking the norco and doxycycline as prescribed without complications. He denies fever, chills, nasuea and emesis as associated symptoms. He is a former smoker but denies alcohol use.  Past Medical History  Diagnosis Date  . Left renal mass   . Arthritis     Past Surgical History  Procedure Date  . Mandible fracture surgery age 2  . Kidney surgery     Family History  Problem Relation Age of Onset  . Cancer Mother     History  Substance Use Topics  . Smoking status: Former Smoker -- 1.0 packs/day for 31 years  . Smokeless tobacco: Never Used  . Alcohol Use: No      Review of Systems  Constitutional: Negative for fever and chills.  Gastrointestinal: Negative for nausea and vomiting.  Skin: Positive for wound (abscess to back).      Allergies  Review of patient's allergies indicates no known  allergies.  Home Medications   Current Outpatient Rx  Name Route Sig Dispense Refill  . DOXYCYCLINE HYCLATE 100 MG PO CAPS Oral Take 1 capsule (100 mg total) by mouth 2 (two) times daily. For 10 days 20 capsule 0  . GEMFIBROZIL 600 MG PO TABS Oral Take 600 mg by mouth 2 (two) times daily.    Marland Kitchen HYDROCODONE-ACETAMINOPHEN 5-325 MG PO TABS  Take one-two tabs po q 4-6 hrs prn pain 24 tablet 0  . IBUPROFEN 800 MG PO TABS Oral Take 800 mg by mouth 6 (six) times daily. AS NEEDED FOR ARTHRITIS IN BACK    . METOPROLOL TARTRATE 12.5 MG HALF TABLET Oral Take 0.5 tablets (12.5 mg total) by mouth 2 (two) times daily. 60 tablet 2    Follow up with your PCP regarding your high blood  ...  . THERA M PLUS PO TABS Oral Take 2 tablets by mouth daily. Omega Men Multivitamin     . SERTRALINE HCL 50 MG PO TABS Oral Take 50 mg by mouth daily.      Triage Vitals: BP 129/85  Pulse 73  Temp 98 F (36.7 C) (Oral)  Resp 16  SpO2 100%  Physical Exam  Nursing note and vitals reviewed. CONSTITUTIONAL: Well developed/well nourished HEAD AND FACE: Normocephalic/atraumatic EYES: EOMI ENMT: Mucous membranes moist NECK: supple no meningeal signs SPINE:entire spine nontender  CV: S1/S2 noted, no murmurs/rubs/gallops noted LUNGS: Lungs are clear to auscultation bilaterally, no apparent distress ABDOMEN: soft, nontender, no rebound or guarding NEURO: Pt is awake/alert, moves all extremitiesx4 EXTREMITIES: pulses normal, full ROM SKIN: warm, color normal, abscess noticed just left of T spine, activley draining pus with minimally surrounding erythema.  No fluctuance noted.  No streaking, no crepitance PSYCH: no abnormalities of mood noted   ED Course  Procedures   DIAGNOSTIC STUDIES: Oxygen Saturation is 100% on room air, normal by my interpretation.    COORDINATION OF CARE: 9:53AM-Discussed discharge plan with pt and pt agreed to plan.      MDM  Nursing notes including past medical history and social  history reviewed and considered in documentation Previous records reviewed and considered - recent ED visit for I&D       I personally performed the services described in this documentation, which was scribed in my presence. The recorded information has been reviewed and considered.      Kevin Gaskins, MD 01/11/12 1006

## 2012-01-11 NOTE — ED Notes (Signed)
Seen here x 2 days ago for wound to center of back.  Here to have packing removed.

## 2012-01-13 NOTE — ED Provider Notes (Signed)
Medical screening examination/treatment/procedure(s) were performed by non-physician practitioner and as supervising physician I was immediately available for consultation/collaboration.  Essie Gehret T Lissy Deuser, MD 01/13/12 1629 

## 2012-04-06 ENCOUNTER — Encounter (HOSPITAL_COMMUNITY): Payer: Self-pay

## 2012-04-06 ENCOUNTER — Emergency Department (HOSPITAL_COMMUNITY)
Admission: EM | Admit: 2012-04-06 | Discharge: 2012-04-06 | Discharge status: Against Medical Advice | Payer: Medicaid Other | Attending: Emergency Medicine | Admitting: Emergency Medicine

## 2012-04-06 DIAGNOSIS — F1020 Alcohol dependence, uncomplicated: Secondary | ICD-10-CM | POA: Insufficient documentation

## 2012-04-06 DIAGNOSIS — F17200 Nicotine dependence, unspecified, uncomplicated: Secondary | ICD-10-CM | POA: Insufficient documentation

## 2012-04-06 DIAGNOSIS — F1010 Alcohol abuse, uncomplicated: Secondary | ICD-10-CM

## 2012-04-06 LAB — CBC WITH DIFFERENTIAL/PLATELET
Basophils Absolute: 0 10*3/uL (ref 0.0–0.1)
HCT: 39.6 % (ref 39.0–52.0)
Lymphocytes Relative: 29 % (ref 12–46)
Lymphs Abs: 1.7 10*3/uL (ref 0.7–4.0)
Neutro Abs: 3.4 10*3/uL (ref 1.7–7.7)
Platelets: 190 10*3/uL (ref 150–400)
RBC: 4.46 MIL/uL (ref 4.22–5.81)
RDW: 12.4 % (ref 11.5–15.5)
WBC: 5.9 10*3/uL (ref 4.0–10.5)

## 2012-04-06 LAB — ETHANOL
Alcohol, Ethyl (B): 289 mg/dL — ABNORMAL HIGH (ref 0–11)
Ethanol %: 0.302 % (ref 0.000–0.080)

## 2012-04-06 LAB — RAPID URINE DRUG SCREEN, HOSP PERFORMED
Amphetamines: NOT DETECTED
Barbiturates: NOT DETECTED
Tetrahydrocannabinol: NOT DETECTED

## 2012-04-06 LAB — COMPREHENSIVE METABOLIC PANEL
ALT: 19 U/L (ref 0–53)
AST: 32 U/L (ref 0–37)
Alkaline Phosphatase: 65 U/L (ref 39–117)
Anion Gap: 5 — ABNORMAL LOW (ref 7–16)
Bilirubin,Total: 0.3 mg/dL (ref 0.2–1.0)
CO2: 31 mEq/L (ref 19–32)
Chloride: 95 mEq/L — ABNORMAL LOW (ref 96–112)
Chloride: 97 mmol/L — ABNORMAL LOW (ref 98–107)
Co2: 33 mmol/L — ABNORMAL HIGH (ref 21–32)
Creatinine: 0.91 mg/dL (ref 0.60–1.30)
EGFR (African American): >60
EGFR (Non-African Amer.): >60
GFR calc non Af Amer: 89 mL/min — ABNORMAL LOW (ref >90)
Glucose: 83 mg/dL (ref 65–99)
Osmolality: 268 (ref 275–301)
Potassium: 3.8 mmol/L (ref 3.5–5.1)
Sodium: 136 mEq/L (ref 135–145)
Sodium: 135 mmol/L — ABNORMAL LOW (ref 136–145)
Total Bilirubin: 0.3 mg/dL (ref 0.3–1.2)

## 2012-04-06 LAB — DRUG SCREEN, URINE
Amphetamines, Ur Screen: NEGATIVE (ref ?–1000)
Benzodiazepine, Ur Scrn: POSITIVE (ref ?–200)
Opiate, Ur Screen: NEGATIVE (ref ?–300)
Phencyclidine (PCP) Ur S: NEGATIVE (ref ?–25)
Tricyclic, Ur Screen: NEGATIVE (ref ?–1000)

## 2012-04-06 LAB — CBC
HCT: 41.7 % (ref 40.0–52.0)
MCH: 33 pg (ref 26.0–34.0)
MCHC: 36.1 g/dL — ABNORMAL HIGH (ref 32.0–36.0)
MCV: 92 fL (ref 80–100)
RDW: 13.2 % (ref 11.5–14.5)

## 2012-04-06 LAB — TSH: Thyroid Stimulating Horm: 0.82 u[IU]/mL

## 2012-04-06 LAB — ACETAMINOPHEN LEVEL: Acetaminophen: <2 ug/mL

## 2012-04-06 NOTE — ED Notes (Signed)
Pt states that he has been drinking, 5 beers this morning, and took 20, 5mg  Valium last night "to be able to sleep."  Pt denies any intent to harm himself or others.  Pt states he was able to rest "about four hours" last night.  Pt currently requesting meds to be able to sleep, MD aware, no new orders.  Pt is calm and cooperative at this time.  NAD noted.

## 2012-04-06 NOTE — ED Notes (Signed)
Pt came out to nurses' station and said he was leaving.  Pt walked out of ed towards registration, asked pt how he was getting home and he said "I'll walk."  Pt did not stop to answer, continued walking out of ED.  Dr. Bebe Shaggy notified and pt's primary RN calling police to see if they can give him a ride home.

## 2012-04-06 NOTE — ED Notes (Addendum)
EMS reports pt took 20 5mg  valium last night around midnight.  Pt says he took them because he had been drinking and couldn't sleep.  Pt denies that he was trying to kill himself.  He repeatedly states was just trying to get some sleep.   Pt reports has been drinking heavily for the past 5 days and has not had any sleep.  Reports has had 5 beers this morning.

## 2012-04-06 NOTE — ED Provider Notes (Signed)
History    This chart was scribed for Joya Gaskins, MD, MD by Smitty Pluck. The patient was seen in room APA18/APA18 and the patient's care was started at 7:15AM.   CSN: [id number]  Arrival date & time 04/06/12  0702   First MD Initiated Contact with Patient 04/06/12 0715      Chief Complaint  Patient presents with  . V70.1     HPI Kevin [name] is a 46 y.o. male who presents to the Emergency Department complaining of alcohol problem onset 2 months ago. 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 Pt denies having vomiting, nausea, diarrhea, chest pain and headache. Pt reports that he has been in rehab 3x (last time was 2 years ago). Denies any pain.   Past Medical History  Diagnosis Date  . Left renal mass   . Arthritis     Past Surgical History  Procedure Date  . Mandible fracture surgery age 28  . Kidney surgery     Family History  Problem Relation Age of Onset  . Cancer Mother     History  Substance Use Topics  . Smoking status: Current Everyday Smoker -- 1.0 packs/day for 31 years  . Smokeless tobacco: Never Used  . Alcohol Use: Yes     started drinking heavily 2 weeks ago.      Review of Systems  All other systems reviewed and are negative.   10 Systems reviewed and all are negative for acute change except as noted in the HPI.   Allergies  Review of patient's allergies indicates no known allergies.  Home Medications   Current Outpatient Rx  Name Route Sig Dispense Refill  . GEMFIBROZIL 600 MG PO TABS Oral Take 600 mg by mouth 2 (two) times daily.    . IBUPROFEN 800 MG PO TABS Oral Take 800 mg by mouth 6 (six) times daily. AS NEEDED FOR ARTHRITIS IN BACK    . METOPROLOL TARTRATE 12.5 MG HALF TABLET Oral Take 0.5 tablets (12.5 mg total) by mouth 2 (two) times daily. 60 tablet 2    Follow up with your PCP regarding your high blood  ...  . THERA M  PLUS PO TABS Oral Take 2 tablets by mouth daily. Omega Men Multivitamin     . SERTRALINE HCL 50 MG PO TABS Oral Take 50 mg by mouth daily.      BP 123/105  Pulse 98  Temp 97.9 F (36.6 C) (Oral)  Resp 18  Ht 5\' 6"  (1.676 m)  Wt 160 lb (72.576 kg)  BMI 25.82 kg/m2  SpO2 99%  Physical Exam  Nursing note and vitals reviewed. CONSTITUTIONAL: Well developed/well nourished HEAD AND FACE: Normocephalic/atraumatic EYES: EOMI/PERRL ENMT: Mucous membranes moist NECK: supple no meningeal signs CV: S1/S2 noted, no murmurs/rubs/gallops noted LUNGS: Lungs are clear to auscultation bilaterally, no apparent distress ABDOMEN: soft, nontender, no rebound or guarding GU:no cva tenderness NEURO: Pt is awake/alert, moves all extremitiesx4, speech is slightly slurred  EXTREMITIES: pulses normal, full ROM SKIN: warm, color normal PSYCH: no abnormalities of mood noted   ED Course  Procedures  DIAGNOSTIC STUDIES: Oxygen Saturation is 99% on room air, normal by my interpretation.    COORDINATION OF CARE: 7:18AM  Discussed pt ED treatment with pt   Pt well appearing, no distress, denies SI, he refused to stay for any further treatment and he  left on his own.  I asked nurse to call police for assistance in helping patient go home.    Labs Reviewed  CBC WITH DIFFERENTIAL - Abnormal; Notable for the following:    MCHC 36.4 (*)     All other components within normal limits  COMPREHENSIVE METABOLIC PANEL - Abnormal; Notable for the following:    Chloride 95 (*)     Glucose, Bld 107 (*)     GFR calc non Af Amer 89 (*)     All other components within normal limits  SALICYLATE LEVEL - Abnormal; Notable for the following:    Salicylate Lvl <2.0 (*)     All other components within normal limits  URINE RAPID DRUG SCREEN (HOSP PERFORMED) - Abnormal; Notable for the following:    Benzodiazepines POSITIVE (*)     All other components within normal limits  ETHANOL - Abnormal; Notable for the following:     Alcohol, Ethyl (B) 289 (*)     All other components within normal limits  ACETAMINOPHEN LEVEL      MDM  Nursing notes including past medical history and social history reviewed and considered in documentation Labs/vital reviewed and considered   I personally performed the services described in this documentation, which was scribed in my presence. The recorded information has been reviewed and considered.          Joya Gaskins, MD 04/06/12 [phone]

## 2012-04-06 NOTE — ED Notes (Signed)
Pt was found changing into his own clothes to leave.  Pt was asked if there was anything that could be done to make him more comfortable here.  Pt stated, "I've done this and been through this before.  I'm leaving." Pt was asked to sign AMA, but refused.  Charge Nurse and MD made aware.  [place] PD called to request a ride for him home, stated they will try to check on him for his well fare.

## 2012-04-06 NOTE — ED Notes (Signed)
Pt appears to be sleeping, equal chest rise and fall noted

## 2012-04-07 ENCOUNTER — Inpatient Hospital Stay: Payer: Self-pay | Admitting: Psychiatry

## 2012-04-08 LAB — BEHAVIORAL MEDICINE 1 PANEL
Albumin: 4 g/dL (ref 3.4–5.0)
Alkaline Phosphatase: 69 U/L (ref 50–136)
Anion Gap: 8 (ref 7–16)
BUN: 12 mg/dL (ref 7–18)
Basophil #: 0 10*3/uL (ref 0.0–0.1)
Basophil %: 0.5 %
Bilirubin,Total: 0.7 mg/dL (ref 0.2–1.0)
Chloride: 103 mmol/L (ref 98–107)
Creatinine: 0.94 mg/dL (ref 0.60–1.30)
EGFR (African American): >60
Eosinophil #: 0.2 10*3/uL (ref 0.0–0.7)
Glucose: 92 mg/dL (ref 65–99)
HGB: 14 g/dL (ref 13.0–18.0)
Lymphocyte %: 32.4 %
MCH: 32.7 pg (ref 26.0–34.0)
MCHC: 35.3 g/dL (ref 32.0–36.0)
MCV: 93 fL (ref 80–100)
Monocyte #: 0.4 x10 3/mm (ref 0.2–1.0)
Neutrophil %: 53.6 %
Platelet: 154 10*3/uL (ref 150–440)
Potassium: 3.7 mmol/L (ref 3.5–5.1)
RDW: 13.2 % (ref 11.5–14.5)
SGOT(AST): 48 U/L — ABNORMAL HIGH (ref 15–37)
Sodium: 140 mmol/L (ref 136–145)
Total Protein: 7.5 g/dL (ref 6.4–8.2)

## 2012-05-08 ENCOUNTER — Ambulatory Visit (HOSPITAL_COMMUNITY)
Admission: RE | Admit: 2012-05-08 | Discharge: 2012-05-08 | Disposition: A | Discharge status: Home / Self Care | Payer: Medicaid Other | Source: Ambulatory Visit | Attending: Urology | Admitting: Urology

## 2012-05-08 ENCOUNTER — Other Ambulatory Visit (HOSPITAL_COMMUNITY): Payer: Self-pay | Admitting: Urology

## 2012-05-08 DIAGNOSIS — C649 Malignant neoplasm of unspecified kidney, except renal pelvis: Secondary | ICD-10-CM

## 2012-07-12 ENCOUNTER — Encounter (HOSPITAL_COMMUNITY): Payer: Self-pay | Admitting: *Deleted

## 2012-07-12 ENCOUNTER — Emergency Department (HOSPITAL_COMMUNITY)
Admission: EM | Admit: 2012-07-12 | Discharge: 2012-07-12 | Disposition: A | Discharge status: Home / Self Care | Payer: Medicaid Other | Attending: Emergency Medicine | Admitting: Emergency Medicine

## 2012-07-12 DIAGNOSIS — R51 Headache: Secondary | ICD-10-CM | POA: Insufficient documentation

## 2012-07-12 DIAGNOSIS — J329 Chronic sinusitis, unspecified: Secondary | ICD-10-CM

## 2012-07-12 DIAGNOSIS — F17200 Nicotine dependence, unspecified, uncomplicated: Secondary | ICD-10-CM | POA: Insufficient documentation

## 2012-07-12 DIAGNOSIS — Z79899 Other long term (current) drug therapy: Secondary | ICD-10-CM | POA: Insufficient documentation

## 2012-07-12 DIAGNOSIS — N289 Disorder of kidney and ureter, unspecified: Secondary | ICD-10-CM | POA: Insufficient documentation

## 2012-07-12 DIAGNOSIS — M129 Arthropathy, unspecified: Secondary | ICD-10-CM | POA: Insufficient documentation

## 2012-07-12 DIAGNOSIS — J3489 Other specified disorders of nose and nasal sinuses: Secondary | ICD-10-CM | POA: Insufficient documentation

## 2012-07-12 MED ORDER — PSEUDOEPHEDRINE HCL 60 MG PO TABS: ORAL_TABLET | ORAL | Status: DC

## 2012-07-12 MED ORDER — SALINE NASAL SPRAY 0.65 % NA SOLN: 2.0000 | NASAL | Status: DC | PRN

## 2012-07-12 MED ORDER — DOXYCYCLINE HYCLATE 100 MG PO CAPS: 100.0000 mg | ORAL_CAPSULE | Freq: Two times a day (BID) | ORAL | Status: AC

## 2012-07-12 MED ORDER — PREDNISONE 10 MG PO TABS: ORAL_TABLET | ORAL | Status: DC

## 2012-07-12 NOTE — ED Provider Notes (Addendum)
History     CSN: [id number]  Arrival date & time 07/12/12  1523   First MD Initiated Contact with Patient 07/12/12 1639      Chief Complaint  Patient presents with  . Recurrent Sinusitis    (Consider location/radiation/quality/duration/timing/severity/associated sxs/prior treatment) HPI Comments: Pt states he was cleaning his dad's home a week ago, and there was mold present. He has since been having left side pain in the sinus area..  No high fever or blood in nasal mucous. He has taken ibuprofen for the discomfort, but this is not helping. He states he had a similar problem a year ago an the problem was related to his sinuses. No hx of injury or truama. No previous operations or procedures on the sinuses.  The history is provided by the patient.    Past Medical History  Diagnosis Date  . Left renal mass   . Arthritis     Past Surgical History  Procedure Date  . Mandible fracture surgery age 46  . Kidney surgery     Family History  Problem Relation Age of Onset  . Cancer Mother     History  Substance Use Topics  . Smoking status: Current Every Day Smoker -- 1.0 packs/day for 31 years  . Smokeless tobacco: Never Used  . Alcohol Use: Yes     Comment: started drinking heavily 2 weeks ago.      Review of Systems  Constitutional: Negative for activity change.       All ROS Neg except as noted in HPI  HENT: Positive for congestion and postnasal drip. Negative for nosebleeds and neck pain.   Eyes: Negative for photophobia and discharge.  Respiratory: Negative for cough, shortness of breath and wheezing.   Cardiovascular: Negative for chest pain and palpitations.  Gastrointestinal: Negative for abdominal pain and blood in stool.  Genitourinary: Negative for dysuria, frequency and hematuria.  Musculoskeletal: Positive for arthralgias. Negative for back pain.  Skin: Negative.   Neurological: Positive for headaches. Negative for dizziness, seizures and speech  difficulty.  Psychiatric/Behavioral: Negative for hallucinations and confusion.    Allergies  Review of patient's allergies indicates no known allergies.  Home Medications   Current Outpatient Rx  Name  Route  Sig  Dispense  Refill  . GEMFIBROZIL 600 MG PO TABS   Oral   Take 600 mg by mouth 2 (two) times daily.         . IBUPROFEN 800 MG PO TABS   Oral   Take 800 mg by mouth 6 (six) times daily. AS NEEDED FOR ARTHRITIS IN BACK         . METOPROLOL TARTRATE 12.5 MG HALF TABLET   Oral   Take 0.5 tablets (12.5 mg total) by mouth 2 (two) times daily.   60 tablet   2     Follow up with your PCP regarding your high blood  ...   . THERA M PLUS PO TABS   Oral   Take 2 tablets by mouth daily. Omega Men Multivitamin            BP 91/56  Pulse 75  Temp 98.2 F (36.8 C)  Resp 20  Ht 5\' 6"  (1.676 m)  Wt 160 lb (72.576 kg)  BMI 25.82 kg/m2  SpO2 100%  Physical Exam  Nursing note and vitals reviewed. Constitutional: He is oriented to person, place, and time. He appears well-developed and well-nourished.  Non-toxic appearance.  HENT:  Head: Normocephalic.  Right Ear: Hearing, tympanic  membrane, external ear and ear canal normal.  Left Ear: Hearing, tympanic membrane, external ear and ear canal normal.       Mild pain to percussion to the left frontal sinus.   Eyes: EOM and lids are normal. Pupils are equal, round, and reactive to light.  Neck: Normal range of motion. Neck supple. Carotid bruit is not present.  Cardiovascular: Normal rate, regular rhythm, normal heart sounds, intact distal pulses and normal pulses.   Pulmonary/Chest: Breath sounds normal. No respiratory distress.  Abdominal: Soft. Bowel sounds are normal. There is no tenderness. There is no guarding.  Musculoskeletal: Normal range of motion.  Lymphadenopathy:       Head (right side): No submandibular adenopathy present.       Head (left side): No submandibular adenopathy present.    He has no cervical  adenopathy.  Neurological: He is alert and oriented to person, place, and time. He has normal strength. No cranial nerve deficit or sensory deficit.  Skin: Skin is warm and dry.  Psychiatric: He has a normal mood and affect. His speech is normal.    ED Course  Procedures (including critical care time)  Labs Reviewed - No data to display No results found. Pulse ox 100% on room air. WNL by my interpretation.  No diagnosis found.    MDM  I have reviewed nursing notes, vital signs, and all appropriate lab and imaging results for this patient. Patient suspect problem with his sinuses.. No high fever. No previous hx of sinuse injury or surgery. Will treat with saline nasal drops, sudafed and prednisone. 5:40 Pt insistent  That he needs an antibiotic because that is what cleared his sinus problem up in the past. Explained to pt that the usual symptoms and physical findings for bacterial sinusitis were not present at this time.  He strongly request  The Rx. Rx for doxycycline 100mg  given to the patient.      Kathie Dike, PA 07/12/12 1708  Kathie Dike, [place] 07/12/12 [phone]

## 2012-07-12 NOTE — ED Notes (Signed)
C/o headache, thinks it is a sinus infection, history of same

## 2012-07-12 NOTE — ED Provider Notes (Signed)
Medical screening examination/treatment/procedure(s) were performed by non-physician practitioner and as supervising physician I was immediately available for consultation/collaboration. Devoria Albe, MD, Armando Gang   Ward Givens, MD 07/12/12 [phone]

## 2012-07-12 NOTE — ED Provider Notes (Signed)
Medical screening examination/treatment/procedure(s) were performed by non-physician practitioner and as supervising physician I was immediately available for consultation/collaboration. Vital signs normal    Ward Givens, MD 07/12/12 [phone]

## 2012-08-02 ENCOUNTER — Emergency Department (HOSPITAL_COMMUNITY)
Admission: EM | Admit: 2012-08-02 | Discharge: 2012-08-02 | Disposition: A | Discharge status: Home / Self Care | Payer: Medicaid Other | Attending: Emergency Medicine | Admitting: Emergency Medicine

## 2012-08-02 ENCOUNTER — Encounter (HOSPITAL_COMMUNITY): Payer: Self-pay | Admitting: *Deleted

## 2012-08-02 ENCOUNTER — Emergency Department (HOSPITAL_COMMUNITY): Payer: Medicaid Other

## 2012-08-02 DIAGNOSIS — Z79899 Other long term (current) drug therapy: Secondary | ICD-10-CM | POA: Insufficient documentation

## 2012-08-02 DIAGNOSIS — R51 Headache: Secondary | ICD-10-CM | POA: Insufficient documentation

## 2012-08-02 DIAGNOSIS — M129 Arthropathy, unspecified: Secondary | ICD-10-CM | POA: Insufficient documentation

## 2012-08-02 DIAGNOSIS — Z87448 Personal history of other diseases of urinary system: Secondary | ICD-10-CM | POA: Insufficient documentation

## 2012-08-02 DIAGNOSIS — Z87891 Personal history of nicotine dependence: Secondary | ICD-10-CM | POA: Insufficient documentation

## 2012-08-02 HISTORY — DX: Malignant (primary) neoplasm, unspecified: C80.1

## 2012-08-02 LAB — COMPREHENSIVE METABOLIC PANEL
ALT: 14 U/L (ref 0–53)
AST: 21 U/L (ref 0–37)
CO2: 29 mEq/L (ref 19–32)
Calcium: 9.7 mg/dL (ref 8.4–10.5)
GFR calc non Af Amer: 62 mL/min — ABNORMAL LOW (ref >90)
Sodium: 139 mEq/L (ref 135–145)
Total Protein: 6.9 g/dL (ref 6.0–8.3)

## 2012-08-02 LAB — CBC WITH DIFFERENTIAL/PLATELET
Basophils Absolute: 0 10*3/uL (ref 0.0–0.1)
Eosinophils Relative: 2 % (ref 0–5)
Lymphocytes Relative: 25 % (ref 12–46)
MCV: 87.7 fL (ref 78.0–100.0)
Neutro Abs: 4.2 10*3/uL (ref 1.7–7.7)
Platelets: 146 10*3/uL — ABNORMAL LOW (ref 150–400)
RDW: 11.6 % (ref 11.5–15.5)
WBC: 6.4 10*3/uL (ref 4.0–10.5)

## 2012-08-02 MED ORDER — HYDROMORPHONE HCL PF 1 MG/ML IJ SOLN
1.0000 mg | Freq: Once | INTRAMUSCULAR | Status: AC
  Administered 2012-08-02: 1 mg via INTRAVENOUS
  Filled 2012-08-02: qty 1

## 2012-08-02 MED ORDER — ONDANSETRON HCL 4 MG/2ML IJ SOLN
4.0000 mg | Freq: Once | INTRAMUSCULAR | Status: AC
  Administered 2012-08-02: 4 mg via INTRAVENOUS
  Filled 2012-08-02: qty 2

## 2012-08-02 MED ORDER — HYDROCODONE-ACETAMINOPHEN 5-325 MG PO TABS: 1.0000 | ORAL_TABLET | Freq: Four times a day (QID) | ORAL | Status: DC | PRN

## 2012-08-02 NOTE — ED Provider Notes (Signed)
History     CSN: [id number]  Arrival date & time 08/02/12  1612   First MD Initiated Contact with Patient 08/02/12 1649      Chief Complaint  Patient presents with  . Headache    (Consider location/radiation/quality/duration/timing/severity/associated sxs/prior treatment) Patient is a 47 y.o. male presenting with headaches. The history is provided by the patient (pt complains of a headache when lifting weights).  Headache  This is a recurrent problem. The current episode started 12 to 24 hours ago. The problem occurs every few minutes. The problem has been gradually improving. The headache is associated with nothing. The pain is located in the bilateral region. The quality of the pain is described as dull. The pain is at a severity of 4/10.    Past Medical History  Diagnosis Date  . Left renal mass   . Arthritis   . Cancer     Past Surgical History  Procedure Date  . Mandible fracture surgery age 71  . Kidney surgery     Family History  Problem Relation Age of Onset  . Cancer Mother     History  Substance Use Topics  . Smoking status: Former Smoker -- 1.0 packs/day for 31 years  . Smokeless tobacco: Never Used  . Alcohol Use: Yes     Comment: started drinking heavily 2 weeks ago.      Review of Systems  Constitutional: Negative for fatigue.  HENT: Negative for congestion, sinus pressure and ear discharge.   Eyes: Negative for discharge.  Respiratory: Negative for cough.   Cardiovascular: Negative for chest pain.  Gastrointestinal: Negative for abdominal pain and diarrhea.  Genitourinary: Negative for frequency and hematuria.  Musculoskeletal: Negative for back pain.  Skin: Negative for rash.  Neurological: Positive for headaches. Negative for seizures.  Hematological: Negative.   Psychiatric/Behavioral: Negative for hallucinations.    Allergies  Review of patient's allergies indicates no known allergies.  Home Medications   Current Outpatient Rx    Name  Route  Sig  Dispense  Refill  . GEMFIBROZIL 600 MG PO TABS   Oral   Take 600 mg by mouth 2 (two) times daily.         . IBUPROFEN 800 MG PO TABS   Oral   Take 800 mg by mouth 6 (six) times daily. AS NEEDED FOR ARTHRITIS IN BACK         . METOPROLOL TARTRATE 12.5 MG HALF TABLET   Oral   Take 0.5 tablets (12.5 mg total) by mouth 2 (two) times daily.   60 tablet   2     Follow up with your PCP regarding your high blood  ...   . THERA M PLUS PO TABS   Oral   Take 2 tablets by mouth daily. Omega Men Multivitamin          . HYDROCODONE-ACETAMINOPHEN 5-325 MG PO TABS   Oral   Take 1 tablet by mouth every 6 (six) hours as needed for pain.   20 tablet   0     BP 147/84  Pulse 70  Temp 97.9 F (36.6 C) (Oral)  Resp 20  Ht 5\' 6"  (1.676 m)  Wt 165 lb (74.844 kg)  BMI 26.63 kg/m2  SpO2 100%  Physical Exam  Constitutional: He is oriented to person, place, and time. He appears well-developed.  HENT:  Head: Normocephalic and atraumatic.  Eyes: Conjunctivae normal and EOM are normal. No scleral icterus.  Neck: Neck supple. No thyromegaly present.  Cardiovascular: Normal rate and regular rhythm.  Exam reveals no gallop and no friction rub.   No murmur heard. Pulmonary/Chest: No stridor. He has no wheezes. He has no rales. He exhibits no tenderness.  Abdominal: He exhibits no distension. There is no tenderness. There is no rebound.  Musculoskeletal: Normal range of motion. He exhibits no edema.  Lymphadenopathy:    He has no cervical adenopathy.  Neurological: He is oriented to person, place, and time. Coordination normal.  Skin: No rash noted. No erythema.  Psychiatric: He has a normal mood and affect. His behavior is normal.    ED Course  Procedures (including critical care time)  Labs Reviewed  CBC WITH DIFFERENTIAL - Abnormal; Notable for the following:    RBC 4.05 (*)     HCT 35.5 (*)     MCHC 36.9 (*)     Platelets 146 (*)     All other components  within normal limits  COMPREHENSIVE METABOLIC PANEL - Abnormal; Notable for the following:    Glucose, Bld 104 (*)     GFR calc non Af Amer 62 (*)     GFR calc Af Amer 72 (*)     All other components within normal limits   No results found.   1. Headache       MDM          Benny Lennert, MD 08/02/12 [phone]

## 2012-08-02 NOTE — ED Notes (Signed)
Intermittent headache for last 2 weeks, worse today, Seen here recently for same.  No n/v

## 2012-12-17 ENCOUNTER — Encounter (HOSPITAL_COMMUNITY): Payer: Self-pay | Admitting: *Deleted

## 2012-12-17 ENCOUNTER — Emergency Department (HOSPITAL_COMMUNITY)
Admission: EM | Admit: 2012-12-17 | Discharge: 2012-12-18 | Disposition: A | Discharge status: Home / Self Care | Payer: MEDICAID | Attending: Emergency Medicine | Admitting: Emergency Medicine

## 2012-12-17 DIAGNOSIS — F1020 Alcohol dependence, uncomplicated: Secondary | ICD-10-CM | POA: Insufficient documentation

## 2012-12-17 DIAGNOSIS — Z87448 Personal history of other diseases of urinary system: Secondary | ICD-10-CM | POA: Insufficient documentation

## 2012-12-17 DIAGNOSIS — F411 Generalized anxiety disorder: Secondary | ICD-10-CM | POA: Insufficient documentation

## 2012-12-17 DIAGNOSIS — Z79899 Other long term (current) drug therapy: Secondary | ICD-10-CM | POA: Insufficient documentation

## 2012-12-17 DIAGNOSIS — Z859 Personal history of malignant neoplasm, unspecified: Secondary | ICD-10-CM | POA: Insufficient documentation

## 2012-12-17 DIAGNOSIS — Z8739 Personal history of other diseases of the musculoskeletal system and connective tissue: Secondary | ICD-10-CM | POA: Insufficient documentation

## 2012-12-17 DIAGNOSIS — F1010 Alcohol abuse, uncomplicated: Secondary | ICD-10-CM

## 2012-12-17 DIAGNOSIS — Z87891 Personal history of nicotine dependence: Secondary | ICD-10-CM | POA: Insufficient documentation

## 2012-12-17 DIAGNOSIS — E876 Hypokalemia: Secondary | ICD-10-CM | POA: Insufficient documentation

## 2012-12-17 LAB — CBC WITH DIFFERENTIAL/PLATELET
Basophils Absolute: 0 10*3/uL (ref 0.0–0.1)
Basophils Relative: 0 % (ref 0–1)
MCHC: 36.1 g/dL — ABNORMAL HIGH (ref 30.0–36.0)
Neutro Abs: 2.6 10*3/uL (ref 1.7–7.7)
Neutrophils Relative %: 51 % (ref 43–77)
Platelets: 164 10*3/uL (ref 150–400)
RDW: 12.1 % (ref 11.5–15.5)

## 2012-12-17 LAB — RAPID URINE DRUG SCREEN, HOSP PERFORMED
Barbiturates: NOT DETECTED
Benzodiazepines: NOT DETECTED
Cocaine: NOT DETECTED
Tetrahydrocannabinol: NOT DETECTED

## 2012-12-17 MED ORDER — ACETAMINOPHEN 325 MG PO TABS: 650.0000 mg | ORAL_TABLET | ORAL | Status: DC | PRN

## 2012-12-17 MED ORDER — FOLIC ACID 1 MG PO TABS: 1.0000 mg | ORAL_TABLET | Freq: Every day | ORAL | Status: DC

## 2012-12-17 MED ORDER — LORAZEPAM 1 MG PO TABS: 1.0000 mg | ORAL_TABLET | Freq: Three times a day (TID) | ORAL | Status: DC | PRN

## 2012-12-17 MED ORDER — NICOTINE 21 MG/24HR TD PT24
21.0000 mg | MEDICATED_PATCH | Freq: Every day | TRANSDERMAL | Status: DC
  Administered 2012-12-18: 21 mg via TRANSDERMAL
  Filled 2012-12-17: qty 1

## 2012-12-17 MED ORDER — THIAMINE HCL 100 MG/ML IJ SOLN: 100.0000 mg | Freq: Every day | INTRAMUSCULAR | Status: DC

## 2012-12-17 MED ORDER — LORAZEPAM 2 MG/ML IJ SOLN: 1.0000 mg | Freq: Four times a day (QID) | INTRAMUSCULAR | Status: DC | PRN

## 2012-12-17 MED ORDER — VITAMIN B-1 100 MG PO TABS: 100.0000 mg | ORAL_TABLET | Freq: Every day | ORAL | Status: DC

## 2012-12-17 MED ORDER — ONDANSETRON HCL 4 MG PO TABS: 4.0000 mg | ORAL_TABLET | Freq: Three times a day (TID) | ORAL | Status: DC | PRN

## 2012-12-17 MED ORDER — LORAZEPAM 1 MG PO TABS
1.0000 mg | ORAL_TABLET | Freq: Four times a day (QID) | ORAL | Status: DC | PRN
  Administered 2012-12-18: 1 mg via ORAL
  Filled 2012-12-17: qty 1

## 2012-12-17 MED ORDER — ADULT MULTIVITAMIN W/MINERALS CH: 1.0000 | ORAL_TABLET | Freq: Every day | ORAL | Status: DC

## 2012-12-17 NOTE — ED Provider Notes (Signed)
History    This chart was scribed for [employer]. Colon Branch, MD by Marlyne Beards, ED Scribe. The patient was seen in room [facility]8/[facility]. Patient's care was started at 11:04 PM.    CSN: [id number]  Arrival date & time 12/17/12  2233   First MD Initiated Contact with Patient 12/17/12 2304      No chief complaint on file.   (Consider location/radiation/quality/duration/timing/severity/associated sxs/prior treatment) The history is provided by the patient. No language interpreter was used.   HPI Comments: LENOX BINK is a 47 y.o. male who presents to the Emergency Department complaining of alcohol addiction and anxiety and wants alcohol detox. Pt states that he has been binge drinking continuously for the past few days. He feels like he is jumping through the roof with anxiety. Pt has been through detox in months past and ended up relapsing. Pt denies HI, SI, or any Hallucinations. Pt denies fever, chills, cough, nausea, vomiting, diarrhea, SOB, weakness, and any other associated symptoms.  Pt's current PCP is Dr. Felecia Shelling.  Past Medical History  Diagnosis Date  . Left renal mass   . Arthritis   . Cancer     Past Surgical History  Procedure Laterality Date  . Mandible fracture surgery  age 66  . Kidney surgery      Family History  Problem Relation Age of Onset  . Cancer Mother     History  Substance Use Topics  . Smoking status: Former Smoker -- 1.00 packs/day for 31 years  . Smokeless tobacco: Never Used  . Alcohol Use: Yes      Review of Systems  Psychiatric/Behavioral: The patient is nervous/anxious.   All other systems reviewed and are negative.    Allergies  Review of patient's allergies indicates no known allergies.  Home Medications   Current Outpatient Rx  Name  Route  Sig  Dispense  Refill  . gemfibrozil (LOPID) 600 MG tablet   Oral   Take 600 mg by mouth 2 (two) times daily.         Marland Kitchen HYDROcodone-acetaminophen (NORCO/VICODIN) 5-325 MG per tablet  Oral   Take 1 tablet by mouth every 6 (six) hours as needed for pain.   20 tablet   0   . ibuprofen (ADVIL,MOTRIN) 800 MG tablet   Oral   Take 800 mg by mouth 6 (six) times daily. AS NEEDED FOR ARTHRITIS IN BACK         . metoprolol tartrate (LOPRESSOR) 12.5 mg TABS   Oral   Take 0.5 tablets (12.5 mg total) by mouth 2 (two) times daily.   60 tablet   2     Follow up with your PCP regarding your high blood  ...   . Multiple Vitamins-Minerals (MULTIVITAMINS THER. W/MINERALS) TABS   Oral   Take 2 tablets by mouth daily. Omega Men Multivitamin            BP 120/84  Pulse 95  Temp(Src) 97.8 F (36.6 C) (Oral)  Resp 20  Ht 5\' 6"  (1.676 m)  Wt 165 lb (74.844 kg)  BMI 26.64 kg/m2  SpO2 100%  Physical Exam  Nursing note and vitals reviewed. Constitutional: He is oriented to person, place, and time. He appears well-developed and well-nourished. No distress.  HENT:  Head: Normocephalic and atraumatic.  Eyes: EOM are normal.  Neck: Neck supple. No tracheal deviation present.  Cardiovascular: Normal rate and normal heart sounds.   Pulmonary/Chest: Effort normal and breath sounds normal. No respiratory distress.  Musculoskeletal:  Normal range of motion.  Neurological: He is alert and oriented to person, place, and time.  Skin: Skin is warm and dry.  Psychiatric: He has a normal mood and affect. His behavior is normal.  He is not suicidal, homicidal, or psychotic.    ED Course  Procedures (including critical care time) Results for orders placed during the hospital encounter of 12/17/12  ETHANOL      Result Value Range   Alcohol, Ethyl (B) 226 (*) 0 - 11 mg/dL  URINE RAPID DRUG SCREEN (HOSP PERFORMED)      Result Value Range   Opiates NONE DETECTED  NONE DETECTED   Cocaine NONE DETECTED  NONE DETECTED   Benzodiazepines NONE DETECTED  NONE DETECTED   Amphetamines NONE DETECTED  NONE DETECTED   Tetrahydrocannabinol NONE DETECTED  NONE DETECTED   Barbiturates NONE  DETECTED  NONE DETECTED  CBC WITH DIFFERENTIAL      Result Value Range   WBC 5.2  4.0 - 10.5 K/uL   RBC 4.21 (*) 4.22 - 5.81 MIL/uL   Hemoglobin 13.4  13.0 - 17.0 g/dL   HCT 04.5 (*) 40.9 - 81.1 %   MCV 88.1  78.0 - 100.0 fL   MCH 31.8  26.0 - 34.0 pg   MCHC 36.1 (*) 30.0 - 36.0 g/dL   RDW 91.4  78.2 - 95.6 %   Platelets 164  150 - 400 K/uL   Neutrophils Relative % 51  43 - 77 %   Neutro Abs 2.6  1.7 - 7.7 K/uL   Lymphocytes Relative 36  12 - 46 %   Lymphs Abs 1.9  0.7 - 4.0 K/uL   Monocytes Relative 9  3 - 12 %   Monocytes Absolute 0.4  0.1 - 1.0 K/uL   Eosinophils Relative 4  0 - 5 %   Eosinophils Absolute 0.2  0.0 - 0.7 K/uL   Basophils Relative 0  0 - 1 %   Basophils Absolute 0.0  0.0 - 0.1 K/uL  BASIC METABOLIC PANEL      Result Value Range   Sodium 139  135 - 145 mEq/L   Potassium 3.4 (*) 3.5 - 5.1 mEq/L   Chloride 102  96 - 112 mEq/L   CO2 24  19 - 32 mEq/L   Glucose, Bld 87  70 - 99 mg/dL   BUN 8  6 - 23 mg/dL   Creatinine, Ser 2.13  0.50 - 1.35 mg/dL   Calcium 9.3  8.4 - 08.6 mg/dL   GFR calc non Af Amer 73 (*) >90 mL/min   GFR calc Af Amer 85 (*) >90 mL/min  ETHANOL      Result Value Range   Alcohol, Ethyl (B) 87 (*) 0 - 11 mg/dL   DIAGNOSTIC STUDIES: Oxygen Saturation is 100% on room air, normal by my interpretation.    COORDINATION OF CARE:  11:24 PM Discussed ED treatment with pt and pt agrees.       12:27 AM:  T/C Dorise Bullion, ACT, case discussed, including:  HPI, pertinent PM/SHx, VS/PE, dx testing, ED course and treatment.  Will repeat ETOH is the AM. ACT to see in the AM.  [phone] Patient requested discharge home. 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 He will follow up with [facility]. Will place clonidine patches for help with detox.  MDM  Patient presents for ETOH detoxication. He will remain in the ER overnight and have a repeat ETOH is the AM. His potassium was  slightly low and he was given potassium in the ER. ACT to see. He is  medically cleared for treatment.Patient has decided not to be admitted for detox at this time. His repeat ETOH was 87. He will be discharged. Pt stable in ED with no significant deterioration in condition.The patient appears reasonably screened and/or stabilized for discharge and I doubt any other medical condition or other [facility] requiring further screening, evaluation, or treatment in the ED at this time prior to discharge.  I personally performed the services described in this documentation, which was scribed in my presence. The recorded information has been reviewed and considered.  MDM Reviewed: nursing note and vitals Interpretation: labs           Nicoletta Dress. Colon Branch, MD 12/18/12 4098

## 2012-12-17 NOTE — ED Notes (Addendum)
Wants detox from etoh, Last drank 830 p.  Wanded at triage  Denies SI or HI

## 2012-12-18 LAB — BASIC METABOLIC PANEL
Chloride: 102 mEq/L (ref 96–112)
GFR calc Af Amer: 85 mL/min — ABNORMAL LOW (ref >90)
Potassium: 3.4 mEq/L — ABNORMAL LOW (ref 3.5–5.1)

## 2012-12-18 MED ORDER — CLONIDINE HCL 0.1 MG/24HR TD PTWK
0.1000 mg | MEDICATED_PATCH | Freq: Once | TRANSDERMAL | Status: DC
  Administered 2012-12-18: 0.1 mg via TRANSDERMAL
  Filled 2012-12-18: qty 3

## 2012-12-18 MED ORDER — CLONIDINE HCL 0.1 MG/24HR TD PTWK
0.1000 mg | MEDICATED_PATCH | Freq: Once | TRANSDERMAL | Status: DC
  Administered 2012-12-18: 0.1 mg via TRANSDERMAL

## 2012-12-18 MED ORDER — ZOLPIDEM TARTRATE 5 MG PO TABS
10.0000 mg | ORAL_TABLET | Freq: Once | ORAL | Status: DC
  Filled 2012-12-18: qty 2

## 2012-12-18 NOTE — ED Notes (Signed)
Pt discharged by Mardene Celeste, RN

## 2012-12-18 NOTE — ED Notes (Signed)
Patient asked to speak with MD. Dr. Colon Branch notified.

## 2013-01-21 ENCOUNTER — Other Ambulatory Visit (HOSPITAL_COMMUNITY): Payer: Self-pay | Admitting: Urology

## 2013-01-21 DIAGNOSIS — N281 Cyst of kidney, acquired: Secondary | ICD-10-CM

## 2013-02-07 ENCOUNTER — Ambulatory Visit (HOSPITAL_COMMUNITY): Payer: Medicaid Other

## 2013-02-18 ENCOUNTER — Ambulatory Visit (HOSPITAL_COMMUNITY): Payer: Medicaid Other

## 2013-04-05 ENCOUNTER — Emergency Department: Payer: Self-pay | Admitting: Emergency Medicine

## 2013-04-05 LAB — DRUG SCREEN, URINE
Barbiturates, Ur Screen: NEGATIVE (ref ?–200)
Benzodiazepine, Ur Scrn: POSITIVE (ref ?–200)
Cannabinoid 50 Ng, Ur ~~LOC~~: NEGATIVE (ref ?–50)
Cocaine Metabolite,Ur ~~LOC~~: NEGATIVE (ref ?–300)
MDMA (Ecstasy)Ur Screen: NEGATIVE (ref ?–500)
Methadone, Ur Screen: NEGATIVE (ref ?–300)
Opiate, Ur Screen: NEGATIVE (ref ?–300)
Phencyclidine (PCP) Ur S: NEGATIVE (ref ?–25)

## 2013-04-05 LAB — URINALYSIS, COMPLETE
Bilirubin,UR: NEGATIVE
Leukocyte Esterase: NEGATIVE
Nitrite: NEGATIVE
Ph: 5 (ref 4.5–8.0)
Protein: NEGATIVE
Squamous Epithelial: NONE SEEN
WBC UR: <1 /HPF (ref 0–5)

## 2013-04-05 LAB — COMPREHENSIVE METABOLIC PANEL
Alkaline Phosphatase: 63 U/L (ref 50–136)
Anion Gap: 6 — ABNORMAL LOW (ref 7–16)
Bilirubin,Total: 0.4 mg/dL (ref 0.2–1.0)
Chloride: 109 mmol/L — ABNORMAL HIGH (ref 98–107)
EGFR (Non-African Amer.): >60
Osmolality: 280 (ref 275–301)
Sodium: 140 mmol/L (ref 136–145)

## 2013-04-05 LAB — CBC
HCT: 38.9 % — ABNORMAL LOW (ref 40.0–52.0)
MCH: 33.2 pg (ref 26.0–34.0)
MCHC: 36.7 g/dL — ABNORMAL HIGH (ref 32.0–36.0)
RBC: 4.3 10*6/uL — ABNORMAL LOW (ref 4.40–5.90)
RDW: 12.9 % (ref 11.5–14.5)

## 2013-04-05 LAB — ETHANOL
Ethanol %: 0.251 % — ABNORMAL HIGH (ref 0.000–0.080)
Ethanol: 251 mg/dL

## 2013-05-15 ENCOUNTER — Other Ambulatory Visit: Payer: Self-pay | Admitting: Neurology

## 2013-05-15 DIAGNOSIS — M542 Cervicalgia: Secondary | ICD-10-CM

## 2013-05-15 DIAGNOSIS — C649 Malignant neoplasm of unspecified kidney, except renal pelvis: Secondary | ICD-10-CM

## 2013-05-15 DIAGNOSIS — R51 Headache: Secondary | ICD-10-CM

## 2013-05-20 ENCOUNTER — Ambulatory Visit (HOSPITAL_COMMUNITY)
Admission: RE | Admit: 2013-05-20 | Discharge: 2013-05-20 | Disposition: A | Discharge status: Home / Self Care | Payer: Medicaid Other | Source: Ambulatory Visit | Attending: Neurology | Admitting: Neurology

## 2013-05-20 DIAGNOSIS — M542 Cervicalgia: Secondary | ICD-10-CM | POA: Insufficient documentation

## 2013-05-20 DIAGNOSIS — C649 Malignant neoplasm of unspecified kidney, except renal pelvis: Secondary | ICD-10-CM | POA: Insufficient documentation

## 2013-05-20 DIAGNOSIS — R51 Headache: Secondary | ICD-10-CM | POA: Insufficient documentation

## 2013-05-20 DIAGNOSIS — J329 Chronic sinusitis, unspecified: Secondary | ICD-10-CM | POA: Insufficient documentation

## 2013-08-20 ENCOUNTER — Other Ambulatory Visit: Payer: Self-pay | Admitting: Urology

## 2013-08-20 DIAGNOSIS — C649 Malignant neoplasm of unspecified kidney, except renal pelvis: Secondary | ICD-10-CM

## 2013-09-20 ENCOUNTER — Emergency Department (HOSPITAL_COMMUNITY)
Admission: EM | Admit: 2013-09-20 | Discharge: 2013-09-20 | Disposition: A | Discharge status: Home / Self Care | Payer: Medicaid Other | Attending: Emergency Medicine | Admitting: Emergency Medicine

## 2013-09-20 ENCOUNTER — Other Ambulatory Visit: Payer: Self-pay | Admitting: Urology

## 2013-09-20 ENCOUNTER — Ambulatory Visit (HOSPITAL_COMMUNITY)
Admission: RE | Admit: 2013-09-20 | Discharge: 2013-09-20 | Disposition: A | Discharge status: Home / Self Care | Payer: Medicaid Other | Source: Ambulatory Visit | Attending: Urology | Admitting: Urology

## 2013-09-20 ENCOUNTER — Encounter (HOSPITAL_COMMUNITY): Payer: Self-pay | Admitting: Emergency Medicine

## 2013-09-20 ENCOUNTER — Emergency Department (HOSPITAL_COMMUNITY): Payer: Medicaid Other

## 2013-09-20 DIAGNOSIS — Z85528 Personal history of other malignant neoplasm of kidney: Secondary | ICD-10-CM | POA: Insufficient documentation

## 2013-09-20 DIAGNOSIS — C649 Malignant neoplasm of unspecified kidney, except renal pelvis: Secondary | ICD-10-CM

## 2013-09-20 DIAGNOSIS — Z87891 Personal history of nicotine dependence: Secondary | ICD-10-CM | POA: Insufficient documentation

## 2013-09-20 DIAGNOSIS — R519 Headache, unspecified: Secondary | ICD-10-CM

## 2013-09-20 DIAGNOSIS — Z8679 Personal history of other diseases of the circulatory system: Secondary | ICD-10-CM | POA: Insufficient documentation

## 2013-09-20 DIAGNOSIS — G8929 Other chronic pain: Secondary | ICD-10-CM

## 2013-09-20 DIAGNOSIS — J3489 Other specified disorders of nose and nasal sinuses: Secondary | ICD-10-CM | POA: Insufficient documentation

## 2013-09-20 DIAGNOSIS — M129 Arthropathy, unspecified: Secondary | ICD-10-CM | POA: Insufficient documentation

## 2013-09-20 DIAGNOSIS — R51 Headache: Secondary | ICD-10-CM | POA: Insufficient documentation

## 2013-09-20 HISTORY — DX: Migraine, unspecified, not intractable, without status migrainosus: G43.909

## 2013-09-20 HISTORY — DX: Personal history of other specified conditions: Z87.898

## 2013-09-20 MED ORDER — PROMETHAZINE HCL 25 MG/ML IJ SOLN
25.0000 mg | Freq: Once | INTRAMUSCULAR | Status: AC
  Administered 2013-09-20: 25 mg via INTRAMUSCULAR
  Filled 2013-09-20: qty 1

## 2013-09-20 MED ORDER — DIPHENHYDRAMINE HCL 50 MG/ML IJ SOLN
50.0000 mg | Freq: Once | INTRAMUSCULAR | Status: AC
  Administered 2013-09-20: 50 mg via INTRAMUSCULAR
  Filled 2013-09-20: qty 1

## 2013-09-20 MED ORDER — PROMETHAZINE HCL 25 MG PO TABS: 25.0000 mg | ORAL_TABLET | Freq: Four times a day (QID) | ORAL | Status: DC | PRN

## 2013-09-20 MED ORDER — KETOROLAC TROMETHAMINE 60 MG/2ML IM SOLN
60.0000 mg | Freq: Once | INTRAMUSCULAR | Status: AC
  Administered 2013-09-20: 60 mg via INTRAMUSCULAR
  Filled 2013-09-20: qty 2

## 2013-09-20 MED ORDER — BUTALBITAL-APAP-CAFFEINE 50-325-40 MG PO TABS: 1.0000 | ORAL_TABLET | Freq: Four times a day (QID) | ORAL | Status: DC | PRN

## 2013-09-20 NOTE — ED Provider Notes (Signed)
CSN: [id number]     Arrival date & time 09/20/13  1616 History   First MD Initiated Contact with Patient 09/20/13 1659     Chief Complaint  Patient presents with  . Migraine      HPI Pt was seen at 1710.  Per pt, c/o gradual onset and persistence of constant acute flair of his chronic migraine headache for the past 1 year, worse over the past 2 months. States the headaches have been daily x2 months. States he has been evaluated by Neuro Dr. Merlene Laughter for same, rx topamax and Fioricet with intermittent relief. Has run out of Fioricet. States he has a f/u appointment with Dr. Merlene Laughter in 4 days. Describes the headache as per his usual chronic migraine headache pain pattern.  Denies headache was sudden or maximal in onset or at any time.  Denies visual changes, no focal motor weakness, no tingling/numbness in extremities, no fevers, no neck pain, no rash.  Pt has hx of left renal mass, which was fully removed in 2012; no indication for XRT or chemotherapy.     Neuro: Dr. Merlene Laughter Past Medical History  Diagnosis Date  . 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 Arthritis   . Cancer   . Migraines   . Former consumption of alcohol     quit 2013   Past Surgical History  Procedure Laterality Date  . Mandible fracture surgery  age 55  . Robotic assited partial nephrectomy Left 2012   Family History  Problem Relation Age of Onset  . Cancer Mother    History  Substance Use Topics  . Smoking status: Former Smoker -- 1.00 packs/day for 31 years    Quit date: 09/20/2010  . Smokeless tobacco: Never Used  . Alcohol Use: Yes     Comment: used to/quit 2013    Review of Systems ROS: Statement: All systems negative except as marked or noted in the HPI; Constitutional: Negative for fever and chills. ; ; Eyes: Negative for eye pain, redness and discharge. ; ; ENMT: Negative for ear pain, hoarseness, nasal congestion, sinus pressure and sore throat. ; ; Cardiovascular:  Negative for chest pain, palpitations, diaphoresis, dyspnea and peripheral edema. ; ; Respiratory: Negative for cough, wheezing and stridor. ; ; Gastrointestinal: Negative for nausea, vomiting, diarrhea, abdominal pain, blood in stool, hematemesis, jaundice and rectal bleeding. . ; ; Genitourinary: Negative for dysuria, flank pain and hematuria. ; ; Musculoskeletal: Negative for back pain and neck pain. Negative for swelling and trauma.; ; Skin: Negative for pruritus, rash, abrasions, blisters, bruising and skin lesion.; ; Neuro: +headache. Negative for lightheadedness and neck stiffness. Negative for weakness, altered level of consciousness , altered mental status, extremity weakness, paresthesias, involuntary movement, seizure and syncope.      Allergies  Review of patient's allergies indicates no known allergies.  Home Medications   Current Outpatient Rx  Name  Route  Sig  Dispense  Refill  . butalbital-acetaminophen-caffeine (FIORICET WITH CODEINE) 50-325-40-30 MG per capsule   Oral   Take 1 capsule by mouth every 4 (four) hours as needed for headache or migraine.         Marland Kitchen ibuprofen (ADVIL,MOTRIN) 800 MG tablet   Oral   Take 800 mg by mouth 6 (six) times daily. AS NEEDED FOR ARTHRITIS IN BACK         . butalbital-acetaminophen-caffeine (FIORICET) 50-325-40 MG per tablet   Oral   Take 1-2 tablets by mouth every 6 (six)  hours as needed for headache.   20 tablet   0   . promethazine (PHENERGAN) 25 MG tablet   Oral   Take 1 tablet (25 mg total) by mouth every 6 (six) hours as needed for nausea or vomiting (or headache).   8 tablet   0    BP 133/77  Pulse 83  Temp(Src) 98 F (36.7 C) (Oral)  Resp 18  SpO2 100% Physical Exam 1715: Physical examination:  Nursing notes reviewed; Vital signs and O2 SAT reviewed;  Constitutional: Well developed, Well nourished, Well hydrated, In no acute distress; Head:  Normocephalic, atraumatic; Eyes: EOMI, PERRL, No scleral icterus; ENMT:  TM's clear bilat. +edemetous nasal turbinates bilat with clear rhinorrhea. Mouth and pharynx normal, Mucous membranes moist; Neck: Supple, Full range of motion, No lymphadenopathy; Cardiovascular: Regular rate and rhythm, No murmur, rub, or gallop; Respiratory: Breath sounds clear & equal bilaterally, No rales, rhonchi, wheezes.  Speaking full sentences with ease, Normal respiratory effort/excursion; Chest: Nontender, Movement normal; Abdomen: Soft, Nontender, Nondistended, Normal bowel sounds; Genitourinary: No CVA tenderness; Extremities: Pulses normal, No tenderness, No edema, No calf edema or asymmetry.; Neuro: AA&Ox3, No facial droop. Major CN grossly intact.  Speech clear. No gross focal motor or sensory deficits in extremities. Climbs on and off stretcher easily by himself. Gait steady.; Skin: Color normal, Warm, Dry.   ED Course  Procedures   EKG Interpretation   None       MDM  MDM Reviewed: previous chart, nursing note and vitals Reviewed previous: MRI, x-ray and ultrasound Interpretation: CT scan   Dg Chest 2 View 09/20/2013   CLINICAL DATA:  Renal cell carcinoma.  EXAM: CHEST  2 VIEW  COMPARISON:  05/08/2012  FINDINGS: Lungs are adequately inflated and otherwise clear. Cardiomediastinal silhouette is within normal. There is mild spondylosis of the spine with prominent left paravertebral osteophyte over the lower thoracic spine.  IMPRESSION: No active cardiopulmonary disease.   Electronically Signed   By: Marin Olp M.D.   On: 09/20/2013 16:18   US Renal 09/20/2013   CLINICAL DATA:  Left renal carcinoma  EXAM: RENAL/URINARY TRACT ULTRASOUND COMPLETE  COMPARISON:  10/27/2011  FINDINGS: Right Kidney:  Length: 10.7 cm. Normal cortical thickness and echogenicity. No mass, hydronephrosis or shadowing calcification.  Left Kidney:  Length: 9.5 cm. Normal cortical thickness and echogenicity. Defect identified at inferior pole corresponding to site of known tumor excision. No definite renal  mass, hydronephrosis or shadowing calcification.  Bladder:  Appears normal for degree of bladder distention.  IMPRESSION: Postsurgical changes at inferior pole left kidney.  Otherwise negative renal ultrasound.   Electronically Signed   By: Lavonia Dana M.D.   On: 09/20/2013 16:13   Ct Head Wo Contrast 09/20/2013   CLINICAL DATA:  Migraine headaches  EXAM: CT HEAD WITHOUT CONTRAST  TECHNIQUE: Contiguous axial images were obtained from the base of the skull through the vertex without contrast.  COMPARISON:  05/20/2013, 08/02/2012  FINDINGS: Normal appearance of the intracranial structures. No evidence for acute hemorrhage, mass lesion, midline shift, hydrocephalus or large infarct. No acute bony abnormality. The visualized sinuses are clear.  IMPRESSION: No acute intracranial abnormality.   Electronically Signed   By: Daryll Brod M.D.   On: 09/20/2013 18:24     1845:  No red flags on H&P. Pt feels improved after meds and wants to go home now. Requesting refill of Fioricet. Dx and testing d/w pt.  Questions answered.  Verb understanding, agreeable to d/c home with outpt f/u.  Alfonzo Feller, DO 09/24/13 1228

## 2013-09-20 NOTE — ED Notes (Signed)
C/o migraine "every night" x 2 months. C/o "equilibrium off and cant concentrate" x 2 months also. Has not seen pcp. Pt came in with shades on for comfort. Nad. Nausea. No vomiting.

## 2013-09-20 NOTE — Discharge Instructions (Signed)
°Emergency Department Resource Guide °1) Find a Doctor and Pay Out of Pocket °Although you won't have to find out who is covered by your insurance plan, it is a good idea to ask around and get recommendations. You will then need to call the office and see if the doctor you have chosen will accept you as a new patient and what types of options they offer for patients who are self-pay. Some doctors offer discounts or will set up payment plans for their patients who do not have insurance, but you will need to ask so you aren't surprised when you get to your appointment. ° °2) Contact Your Local Health Department °Not all health departments have doctors that can see patients for sick visits, but many do, so it is worth a call to see if yours does. If you don't know where your local health department is, you can check in your phone book. The CDC also has a tool to help you locate your state's health department, and many state websites also have listings of all of their local health departments. ° °3) Find a Walk-in Clinic °If your illness is not likely to be very severe or complicated, you may want to try a walk in clinic. These are popping up all over the country in pharmacies, drugstores, and shopping centers. They're usually staffed by nurse practitioners or physician assistants that have been trained to treat common illnesses and complaints. They're usually fairly quick and inexpensive. However, if you have serious medical issues or chronic medical problems, these are probably not your best option. ° °No Primary Care Doctor: °- Call [facility] at  832-8000 - they can help you locate a primary care doctor that  accepts your insurance, provides certain services, etc. °- Physician Referral Service- 1-800-533-3463 ° °Chronic Pain Problems: °Organization         Address  Phone   Notes  °[facility]  (336) 297-2271 Patients need to be referred by their primary care doctor.  ° °Medication  Assistance: °Organization         Address  Phone   Notes  °[place] [facility] [address] °[place], [place] [address] (336) 641-8030 --Must be a resident of [place] °-- Must have NO insurance coverage whatsoever (no Medicaid/ Medicare, etc.) °-- The pt. MUST have a primary care doctor that directs their care regularly and follows them in the community °  °[facility]  (866) 331-1348   °[employer]  (888) 892-1162   ° °Agencies that provide inexpensive medical care: °Organization         Address  Phone   Notes  °[facility]  (336) 832-8035   °[facility]    (336) 832-7272   °[facility] [address] °[place], [place] [address] (336) 832-4777   °[facility] [address], °[place] (336) 271-4999   °[facility]    (336) 373-0678   °[facility]    (336) 272-1050   °[facility] ° [address], [place] Phone:  (336) 832-4444, Fax:  (336) 832-4440 Hours of Operation:  9 am - 6 pm, M-F.  Also accepts Medicaid/Medicare and self-pay.  °[facility] ° [address], [place] Phone: (336) 832-3150, Fax: (336) 832-3151. Hours of Operation:  8:30 am - 5:30 pm, M-F.  Also accepts Medicaid and self-pay.  °[facility] [place] [address], [place] Phone: (336) 878-6027   °[facility] [address], [place], [place] (336)723-1848, Ext. 123 Mondays & Thursdays: 7-9 AM.  First 15 patients are seen on a first come, first serve basis. °  ° °Medicaid-accepting [place] Providers: ° °Organization         Address  Phone   Notes  °[facility] [address], [place] (336) 641-2100 Also accepts self-pay patients.  °[facility] [address], [place] ° (336) 856-9996   °[facility] [address], [place]  (336) 288-8857   °[facility] [address] [place] [address], [place] (336) 299-7000   °Veita Bland [address], [place]  ° (336) 373-1557 Only accepts [place] Access Medicaid patients after they have their name applied to their card.  ° °Self-Pay (no insurance) in [place]: ° °Organization         Address  Phone   Notes  °Sickle Cell Patients, [facility] [address], [place] (336) 832-1970   °[facility] [address], [place] (336) 832-4400   °[facility] ° [address], [place] (336) 992-4800   °[facility]/Dr. Osei-Bonsu ° [address] [place] [address], [place] or [address], [place] (336) 841-8500 Phone number for both [place] and [place] locations is the same.  °[facility] [address], [place] (336) 299-0000   °[facility] [address] [place] [address], [place] or [address] (336) 852-7530 °(336) 878-2260   °[facility] [address], [place] (336) 350-1642, phone; (336) 294-5005, fax Sees patients 1st and 3rd Saturday of every month.  Must not qualify for public or private insurance (i.e. Medicaid, Medicare, [place] Health Choice, Veterans' Benefits) • Household income should be no more than 200% of the poverty level •The clinic cannot treat you if you are pregnant or think you are pregnant • Sexually transmitted diseases are not treated at the clinic.  ° ° °Dental Care: °Organization         Address  Phone  Notes  °[place] [facility] [address], [place] (336) 641-6152 Accepts children up to age 21 who are enrolled in Medicaid or [place] Health Choice; pregnant women with a Medicaid card; and children who have applied for Medicaid or [place] Health Choice, but were declined, whose parents can pay a reduced fee at time of service.  °[place]  [facility] [place]  [address], [place] (336) 641-7733 Accepts children up to age 21 who are enrolled in Medicaid or [place] Health Choice; pregnant women with a Medicaid card; and children who have applied for Medicaid or [place] Health Choice, but were declined, whose parents can pay a reduced fee at time of service.  °[facility] ° [address], [place] (336) 641-4533 Patients are seen by appointment only. Walk-ins are not accepted. [facility] will see patients 18 years of age and older. °Monday - Tuesday (8am-5pm) °Most Wednesdays (8:30-5pm) °$30 per visit, cash only  °[facility] ° [address], [place] (336) 641-4533 Patients are seen by appointment only. Walk-ins are not accepted. [facility] will see patients 18 years of age and older. °One   Wednesday Evening (Monthly: Volunteer Based).  $30 per visit, cash only  °[facility]  (919) 537-3737 for adults; Children under age 4, call Graduate Pediatric Dentistry at ([phone]) [phone]. Children aged 4-14, please call ([phone]) [phone] to request a pediatric application. ° Dental services are provided in all areas of dental care including fillings, crowns and bridges, complete and partial dentures, implants, gum treatment, root canals, and extractions. Preventive care is also provided. Treatment is provided to both adults and children. °Patients are selected via a lottery and there is often a waiting list. °  °[facility] [address], °[place] ° (336) 763-8833 [email or web address] °  °[facility] [address], [place], [place] (336)723-1848, Ext. 123 Second and Fourth Thursday of each month, opens at 6:30 AM; Clinic ends at 9 AM.  Patients are seen on a first-come first-served basis, and a limited number are seen during each clinic.  ° °[facility] ° [address], [place], [place] (336) 723-7904    Eligibility Requirements °You must have lived in [place], [place], or [place] counties for at least the last three months. °  You cannot be eligible for state or federal sponsored healthcare insurance, including Veterans Administration, Medicaid, or Medicare. °  You generally cannot be eligible for healthcare insurance through your employer.  °  How to apply: °Eligibility screenings are held every Tuesday and Wednesday afternoon from 1:00 pm until 4:00 pm. You do not need an appointment for the interview!  °[facility] [address], [place], [place] 336-631-2330   °[facility]  336-342-8273   °[facility]  336-703-3100   °[facility]  336-570-6415   ° °Behavioral Health Resources in the Community: °Intensive Outpatient Programs °Organization         Address  Phone  Notes  °[facility] [address], [place], [place] 336-878-6098   °[facility] [address], [place], [place] 336-832-9800   °[facility] [address], [place], [place] ° 336-882-2125   °[facility] [address],  °[place], [place] 1-800-853-5163 or 336-641-4981   °Substance Abuse Resources °Organization         Address  Phone  Notes  °[facility]  336-882-2125   °[facility]  336-784-9470   °[facility]  336-285-9073   °[facility]  336-845-3988   °Residential & Outpatient Substance Abuse Program  1-800-659-3381   °Psychological Services °Organization         Address  Phone  Notes  °[facility]  336- 832-9600   °[facility]  336- 378-7881   °[facility] [address], [place] 1-800-853-5163 or 336-641-4981   ° °Mobile Crisis Teams °Organization         Address  Phone  Notes  °[facility], Mobile Crisis Care Unit  1-877-626-1772   °Assertive °[facility] ° [address]  [place], [place] 336-834-9664   °[name] DeEsch [address] °[place] 336-554-5454   ° °Self-Help/Support Groups °Organization         Address  Phone             Notes  °[facility]  - variety of support groups  336- 373-1402 Call for more information  °Narcotics Anonymous (NA), [facility] [address], °[place]  2 meetings at this location  ° °  Residential Treatment Programs Organization         Address  Phone  Notes  [facility] [address]    [place]  1-603 123 9742   [facility]  [address] [place] [address], [place], [place]   [place], [place] (914) 886-0889 Admissions: 8am-3pm M-F  [facility] [address],    [place], [place] 443-154-0086   [facility] [address] [place], [place], [place]   [facility] [address]  [place], [place]   [facility] Intensive Outpatient [address], [place], [place]   [facility] [place]  [place], [place] 1-608-025-3048 or 8540817685   [facility] [address], [place], [place] Accepts Medicaid  [facility] [address],  [place] 1-540-325-4907 Substance Abuse/Addiction Treatment   [facility] Organization         Address  Phone  Notes  [facility]  (443)688-0298   Domenic Schwab, PhD [address] Arlis [address]a [place], [place]   680 704 4753 or 724-654-8450   [facility] [place] [address], [place] 347-216-0168   [facility] [address], [place], [place] 938-320-8634 Insurance/Medicaid/sponsorship through [facility] [address]                                    [place], [place] [phone] [place] [address] [place], [place] [phone]    Dr. Adele Schilder  [phone]   [facility] Dept. 1) [address] [place] 2) [place] 3)  [address], [place] 610-106-2702 [phone]  [phone]   [place] (713)465-2083 or 531-007-2518 (After Hours)       Take over the counter tylenol, ibuprofen (OR Excedrin) and benadryl, as directed on packaging, with the prescription given to you today, as needed for headache.  Keep a headache diary, as discussed.  Call your regular medical doctor on Monday to schedule a follow up appointment within the next 3 days.  Call your Neurologist on Monday to confirm your appointment on Tuesday as previously scheduled. Return to the Emergency Department immediately sooner if worsening.

## 2013-09-27 ENCOUNTER — Ambulatory Visit (INDEPENDENT_AMBULATORY_CARE_PROVIDER_SITE_OTHER): Payer: Medicaid Other | Admitting: Urology

## 2013-09-27 DIAGNOSIS — I10 Essential (primary) hypertension: Secondary | ICD-10-CM

## 2013-09-27 DIAGNOSIS — N401 Enlarged prostate with lower urinary tract symptoms: Secondary | ICD-10-CM

## 2013-09-27 DIAGNOSIS — Z85528 Personal history of other malignant neoplasm of kidney: Secondary | ICD-10-CM

## 2013-09-27 DIAGNOSIS — N138 Other obstructive and reflux uropathy: Secondary | ICD-10-CM

## 2013-11-29 ENCOUNTER — Ambulatory Visit: Payer: Medicaid Other | Admitting: Urology

## 2014-02-05 ENCOUNTER — Emergency Department (HOSPITAL_COMMUNITY): Payer: Medicaid Other

## 2014-02-05 ENCOUNTER — Emergency Department (HOSPITAL_COMMUNITY)
Admission: EM | Admit: 2014-02-05 | Discharge: 2014-02-05 | Disposition: A | Discharge status: Home / Self Care | Payer: Medicaid Other | Attending: Emergency Medicine | Admitting: Emergency Medicine

## 2014-02-05 ENCOUNTER — Encounter (HOSPITAL_COMMUNITY): Payer: Self-pay | Admitting: Emergency Medicine

## 2014-02-05 DIAGNOSIS — S5000XA Contusion of unspecified elbow, initial encounter: Secondary | ICD-10-CM | POA: Insufficient documentation

## 2014-02-05 DIAGNOSIS — Y9389 Activity, other specified: Secondary | ICD-10-CM | POA: Diagnosis not present

## 2014-02-05 DIAGNOSIS — Z87891 Personal history of nicotine dependence: Secondary | ICD-10-CM | POA: Diagnosis not present

## 2014-02-05 DIAGNOSIS — M129 Arthropathy, unspecified: Secondary | ICD-10-CM | POA: Diagnosis not present

## 2014-02-05 DIAGNOSIS — Y92410 Unspecified street and highway as the place of occurrence of the external cause: Secondary | ICD-10-CM | POA: Diagnosis not present

## 2014-02-05 DIAGNOSIS — S8000XA Contusion of unspecified knee, initial encounter: Secondary | ICD-10-CM | POA: Insufficient documentation

## 2014-02-05 DIAGNOSIS — Z23 Encounter for immunization: Secondary | ICD-10-CM | POA: Diagnosis not present

## 2014-02-05 DIAGNOSIS — IMO0002 Reserved for concepts with insufficient information to code with codable children: Secondary | ICD-10-CM | POA: Diagnosis not present

## 2014-02-05 DIAGNOSIS — Z85528 Personal history of other malignant neoplasm of kidney: Secondary | ICD-10-CM | POA: Diagnosis not present

## 2014-02-05 DIAGNOSIS — S5002XA Contusion of left elbow, initial encounter: Secondary | ICD-10-CM

## 2014-02-05 DIAGNOSIS — S59919A Unspecified injury of unspecified forearm, initial encounter: Secondary | ICD-10-CM | POA: Diagnosis present

## 2014-02-05 DIAGNOSIS — Z8679 Personal history of other diseases of the circulatory system: Secondary | ICD-10-CM | POA: Insufficient documentation

## 2014-02-05 DIAGNOSIS — S59909A Unspecified injury of unspecified elbow, initial encounter: Secondary | ICD-10-CM | POA: Diagnosis present

## 2014-02-05 MED ORDER — HYDROCODONE-ACETAMINOPHEN 5-325 MG PO TABS: 1.0000 | ORAL_TABLET | ORAL | Status: DC | PRN

## 2014-02-05 MED ORDER — BACITRACIN-NEOMYCIN-POLYMYXIN 400-5-5000 EX OINT
TOPICAL_OINTMENT | Freq: Once | CUTANEOUS | Status: AC
  Administered 2014-02-05: 1 via TOPICAL
  Filled 2014-02-05: qty 1

## 2014-02-05 MED ORDER — IBUPROFEN 600 MG PO TABS: 600.0000 mg | ORAL_TABLET | Freq: Four times a day (QID) | ORAL | Status: DC | PRN

## 2014-02-05 MED ORDER — KETOROLAC TROMETHAMINE 60 MG/2ML IM SOLN
60.0000 mg | Freq: Once | INTRAMUSCULAR | Status: AC
  Administered 2014-02-05: 60 mg via INTRAMUSCULAR
  Filled 2014-02-05: qty 2

## 2014-02-05 MED ORDER — TETANUS-DIPHTH-ACELL PERTUSSIS 5-2.5-18.5 LF-MCG/0.5 IM SUSP
0.5000 mL | Freq: Once | INTRAMUSCULAR | Status: AC
  Administered 2014-02-05: 0.5 mL via INTRAMUSCULAR
  Filled 2014-02-05: qty 0.5
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
## 2014-02-06 NOTE — ED Provider Notes (Signed)
Medical screening examination/treatment/procedure(s) were performed by non-physician practitioner and as supervising physician I was immediately available for consultation/collaboration.   EKG Interpretation None        Alfonzo Feller, DO 02/06/14 [phone]

## 2014-02-10 ENCOUNTER — Emergency Department (HOSPITAL_COMMUNITY): Payer: Medicaid Other

## 2014-02-10 ENCOUNTER — Encounter (HOSPITAL_COMMUNITY): Payer: Self-pay | Admitting: Emergency Medicine

## 2014-02-10 ENCOUNTER — Emergency Department (HOSPITAL_COMMUNITY)
Admission: EM | Admit: 2014-02-10 | Discharge: 2014-02-10 | Disposition: A | Discharge status: Home / Self Care | Payer: Medicaid Other | Attending: Emergency Medicine | Admitting: Emergency Medicine

## 2014-02-10 DIAGNOSIS — S99919A Unspecified injury of unspecified ankle, initial encounter: Secondary | ICD-10-CM

## 2014-02-10 DIAGNOSIS — Z87891 Personal history of nicotine dependence: Secondary | ICD-10-CM | POA: Insufficient documentation

## 2014-02-10 DIAGNOSIS — IMO0002 Reserved for concepts with insufficient information to code with codable children: Secondary | ICD-10-CM | POA: Insufficient documentation

## 2014-02-10 DIAGNOSIS — S060X9A Concussion with loss of consciousness of unspecified duration, initial encounter: Secondary | ICD-10-CM | POA: Diagnosis not present

## 2014-02-10 DIAGNOSIS — M129 Arthropathy, unspecified: Secondary | ICD-10-CM | POA: Diagnosis not present

## 2014-02-10 DIAGNOSIS — S8990XA Unspecified injury of unspecified lower leg, initial encounter: Secondary | ICD-10-CM | POA: Diagnosis not present

## 2014-02-10 DIAGNOSIS — Z85528 Personal history of other malignant neoplasm of kidney: Secondary | ICD-10-CM | POA: Insufficient documentation

## 2014-02-10 DIAGNOSIS — S79929A Unspecified injury of unspecified thigh, initial encounter: Secondary | ICD-10-CM

## 2014-02-10 DIAGNOSIS — Y9389 Activity, other specified: Secondary | ICD-10-CM | POA: Insufficient documentation

## 2014-02-10 DIAGNOSIS — S199XXA Unspecified injury of neck, initial encounter: Secondary | ICD-10-CM

## 2014-02-10 DIAGNOSIS — T07XXXA Unspecified multiple injuries, initial encounter: Secondary | ICD-10-CM

## 2014-02-10 DIAGNOSIS — S99929A Unspecified injury of unspecified foot, initial encounter: Secondary | ICD-10-CM

## 2014-02-10 DIAGNOSIS — S79919A Unspecified injury of unspecified hip, initial encounter: Secondary | ICD-10-CM | POA: Insufficient documentation

## 2014-02-10 DIAGNOSIS — S0993XA Unspecified injury of face, initial encounter: Secondary | ICD-10-CM | POA: Insufficient documentation

## 2014-02-10 DIAGNOSIS — Z8679 Personal history of other diseases of the circulatory system: Secondary | ICD-10-CM | POA: Diagnosis not present

## 2014-02-10 DIAGNOSIS — S3992XA Unspecified injury of lower back, initial encounter: Secondary | ICD-10-CM

## 2014-02-10 DIAGNOSIS — Y92410 Unspecified street and highway as the place of occurrence of the external cause: Secondary | ICD-10-CM | POA: Diagnosis not present

## 2014-02-10 MED ORDER — OXYCODONE-ACETAMINOPHEN 5-325 MG PO TABS
1.0000 | ORAL_TABLET | Freq: Once | ORAL | Status: AC
  Administered 2014-02-10: 1 via ORAL
  Filled 2014-02-10: qty 1

## 2014-02-10 MED ORDER — NAPROXEN 500 MG PO TABS: 500.0000 mg | ORAL_TABLET | Freq: Two times a day (BID) | ORAL | Status: DC

## 2014-02-10 MED ORDER — HYDROCODONE-ACETAMINOPHEN 5-325 MG PO TABS: 1.0000 | ORAL_TABLET | ORAL | Status: DC | PRN

## 2014-02-10 MED ORDER — BACITRACIN 500 UNIT/GM EX OINT
1.0000 "application " | TOPICAL_OINTMENT | Freq: Two times a day (BID) | CUTANEOUS | Status: DC
  Administered 2014-02-10: 1 via TOPICAL
  Filled 2014-02-10 (×5): qty 0.9
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
## 2014-02-10 NOTE — ED Notes (Signed)
Hard collar placed on pt.
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
## 2014-02-11 NOTE — ED Provider Notes (Signed)
Medical screening examination/treatment/procedure(s) were performed by non-physician practitioner and as supervising physician I was immediately available for consultation/collaboration.   EKG Interpretation None        Alfonzo Feller, DO 02/11/14 2352

## 2014-05-21 ENCOUNTER — Emergency Department (HOSPITAL_COMMUNITY)
Admission: EM | Admit: 2014-05-21 | Discharge: 2014-05-21 | Disposition: A | Discharge status: Home / Self Care | Payer: No Typology Code available for payment source | Attending: Emergency Medicine | Admitting: Emergency Medicine

## 2014-05-21 ENCOUNTER — Encounter (HOSPITAL_COMMUNITY): Payer: Self-pay | Admitting: Emergency Medicine

## 2014-05-21 DIAGNOSIS — Z87448 Personal history of other diseases of urinary system: Secondary | ICD-10-CM | POA: Insufficient documentation

## 2014-05-21 DIAGNOSIS — Z79899 Other long term (current) drug therapy: Secondary | ICD-10-CM | POA: Insufficient documentation

## 2014-05-21 DIAGNOSIS — R51 Headache: Secondary | ICD-10-CM | POA: Insufficient documentation

## 2014-05-21 DIAGNOSIS — M5442 Lumbago with sciatica, left side: Secondary | ICD-10-CM | POA: Insufficient documentation

## 2014-05-21 DIAGNOSIS — Z720 Tobacco use: Secondary | ICD-10-CM | POA: Insufficient documentation

## 2014-05-21 DIAGNOSIS — Z8679 Personal history of other diseases of the circulatory system: Secondary | ICD-10-CM | POA: Insufficient documentation

## 2014-05-21 DIAGNOSIS — M5432 Sciatica, left side: Secondary | ICD-10-CM

## 2014-05-21 DIAGNOSIS — Z8589 Personal history of malignant neoplasm of other organs and systems: Secondary | ICD-10-CM | POA: Insufficient documentation

## 2014-05-21 MED ORDER — PREDNISONE 50 MG PO TABS
60.0000 mg | ORAL_TABLET | Freq: Once | ORAL | Status: AC
  Administered 2014-05-21: 60 mg via ORAL
  Filled 2014-05-21 (×2): qty 1

## 2014-05-21 MED ORDER — DICLOFENAC SODIUM 75 MG PO TBEC: 75.0000 mg | DELAYED_RELEASE_TABLET | Freq: Two times a day (BID) | ORAL | Status: DC

## 2014-05-21 MED ORDER — DIAZEPAM 5 MG PO TABS: ORAL_TABLET | ORAL | Status: DC

## 2014-05-21 MED ORDER — DEXAMETHASONE 4 MG PO TABS: ORAL_TABLET | ORAL | Status: DC

## 2014-05-21 MED ORDER — HYDROCODONE-ACETAMINOPHEN 5-325 MG PO TABS: ORAL_TABLET | ORAL | Status: DC

## 2014-05-21 MED ORDER — KETOROLAC TROMETHAMINE 60 MG/2ML IM SOLN
60.0000 mg | Freq: Once | INTRAMUSCULAR | Status: AC
  Administered 2014-05-21: 60 mg via INTRAMUSCULAR
  Filled 2014-05-21: qty 2

## 2014-05-21 NOTE — ED Notes (Signed)
Pt c/o lower back pain that radiates down left leg. Pt states the pain is chronic but is getting worse.

## 2014-05-21 NOTE — Discharge Instructions (Signed)
Please rest your back is much as possible. Heat to your lower back maybe helpful. Please use Decadron and diclofenac daily with food. Use of Valium 3 times daily as needed for spasm pain. Value may cause drowsiness, please use with caution. Please see Dr. Tonita Cong for evaluation of your back pain. Sciatica Sciatica is pain, weakness, numbness, or tingling along your sciatic nerve. The nerve starts in the lower back and runs down the back of each leg. Nerve damage or certain conditions pinch or put pressure on the sciatic nerve. This causes the pain, weakness, and other discomforts of sciatica. HOME CARE   Only take medicine as told by your doctor.  Apply ice to the affected area for 20 minutes. Do this 3-4 times a day for the first 48-72 hours. Then try heat in the same way.  Exercise, stretch, or do your usual activities if these do not make your pain worse.  Go to physical therapy as told by your doctor.  Keep all doctor visits as told.  Do not wear high heels or shoes that are not supportive.  Get a firm mattress if your mattress is too soft to lessen pain and discomfort. GET HELP RIGHT AWAY IF:   You cannot control when you poop (bowel movement) or pee (urinate).  You have more weakness in your lower back, lower belly (pelvis), butt (buttocks), or legs.  You have redness or puffiness (swelling) of your back.  You have a burning feeling when you pee.  You have pain that gets worse when you lie down.  You have pain that wakes you from your sleep.  Your pain is worse than past pain.  Your pain lasts longer than 4 weeks.  You are suddenly losing weight without reason. MAKE SURE YOU:   Understand these instructions.  Will watch this condition.  Will get help right away if you are not doing well or get worse. Document Released: 04/26/2008 Document Revised: 01/17/2012 Document Reviewed: 11/27/2011 [facility] Patient Information 2015 [place], [place]. This information is not  intended to replace advice given to you by your health care provider. Make sure you discuss any questions you have with your health care provider.

## 2014-05-21 NOTE — ED Provider Notes (Signed)
CSN: [id number]     Arrival date & time 05/21/14  2046 History   First MD Initiated Contact with Patient 05/21/14 2116     Chief Complaint  Patient presents with  . Back Pain     (Consider location/radiation/quality/duration/timing/severity/associated sxs/prior Treatment) Patient is a 48 y.o. male presenting with back pain. The history is provided by the patient.  Back Pain Location:  Lumbar spine Quality:  Aching Radiates to:  L thigh Pain severity:  Moderate Pain is:  Same all the time Onset quality:  Gradual Timing:  Intermittent Progression:  Worsening Chronicity:  Chronic Associated symptoms: headaches   Associated symptoms: no abdominal pain, no chest pain and no dysuria     Past Medical History  Diagnosis Date  . 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 Arthritis   . Cancer   . Migraines   . Former consumption of alcohol     quit 2013   Past Surgical History  Procedure Laterality Date  . Mandible fracture surgery  age 26  . Robotic assited partial nephrectomy Left 2012   Family History  Problem Relation Age of Onset  . Cancer Mother    History  Substance Use Topics  . Smoking status: Current Some Day Smoker -- 1.00 packs/day for 31 years    Types: Cigarettes    Last Attempt to Quit: 09/20/2010  . Smokeless tobacco: Never Used  . Alcohol Use: Yes     Comment: used to/quit 2013    Review of Systems  Constitutional: Negative for activity change.       All ROS Neg except as noted in HPI  Eyes: Negative for photophobia and discharge.  Respiratory: Negative for cough, shortness of breath and wheezing.   Cardiovascular: Negative for chest pain and palpitations.  Gastrointestinal: Negative for abdominal pain and blood in stool.  Genitourinary: Negative for dysuria, frequency and hematuria.  Musculoskeletal: Positive for arthralgias and back pain. Negative for neck pain.  Skin: Negative.   Neurological: Positive for headaches.  Negative for dizziness, seizures and speech difficulty.  Psychiatric/Behavioral: Negative for hallucinations and confusion.      Allergies  Review of patient's allergies indicates no known allergies.  Home Medications   Prior to Admission medications   Medication Sig Start Date End Date Taking? Authorizing Provider  gemfibrozil (LOPID) 600 MG tablet Take 600 mg by mouth 2 (two) times daily before a meal.   Yes Historical Provider, MD  metoprolol tartrate (LOPRESSOR) 25 MG tablet Take 25 mg by mouth 2 (two) times daily.   Yes Historical Provider, MD  acetaminophen (TYLENOL) 500 MG tablet Take 1,000 mg by mouth daily as needed for mild pain or moderate pain.    Historical Provider, MD   BP 125/89  Pulse 94  Temp(Src) 97.8 F (36.6 C) (Oral)  Resp 20  Ht 5\' 6"  (1.676 m)  Wt 165 lb (74.844 kg)  BMI 26.64 kg/m2  SpO2 100% Physical Exam  Nursing note and vitals reviewed. Constitutional: He is oriented to person, place, and time. He appears well-developed and well-nourished.  Non-toxic appearance.  HENT:  Head: Normocephalic.  Right Ear: Tympanic membrane and external ear normal.  Left Ear: Tympanic membrane and external ear normal.  Eyes: EOM and lids are normal. Pupils are equal, round, and reactive to light.  Neck: Normal range of motion. Neck supple. Carotid bruit is not present.  Cardiovascular: Normal rate, regular rhythm, normal heart sounds, intact distal pulses and normal pulses.  Pulmonary/Chest: Breath sounds normal. No respiratory distress.  Abdominal: Soft. Bowel sounds are normal. There is no tenderness. There is no guarding.  Musculoskeletal:       Lumbar back: He exhibits decreased range of motion, tenderness and pain. He exhibits no deformity.  Pain with straight leg on the left at 30 degrees.  Lymphadenopathy:       Head (right side): No submandibular adenopathy present.       Head (left side): No submandibular adenopathy present.    He has no cervical  adenopathy.  Neurological: He is alert and oriented to person, place, and time. He has normal strength. He displays no atrophy. No cranial nerve deficit or sensory deficit. He exhibits normal muscle tone. Coordination normal.  No foot drop noted.   Skin: Skin is warm and dry.  Psychiatric: He has a normal mood and affect. His speech is normal.    ED Course  Procedures (including critical care time) Labs Review Labs Reviewed - No data to display  Imaging Review No results found.   EKG Interpretation None      MDM  No gross neuovascular deficit noted. No hot areas of the back. Vital signs wnl.  Suspect  Left sciatica. Pt request orthopedic referral. Pt referred to Dr Tonita Cong. Rx for valium, decadron, and diclofenac given to the patient.   At discharge, pt states he can not take diclofenac due to previous kidney surgery. Rx for norco at hs given to the patient. 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 Pt acknowledges understanding of instructions.and    Final diagnoses:  None    *I have reviewed nursing notes, vital signs, and all appropriate lab and imaging results for this patient.**    Lenox Ahr, PA-C 05/21/14 2145  Lenox Ahr, PA-C 05/21/14 2242

## 2014-05-23 NOTE — ED Provider Notes (Signed)
Medical screening examination/treatment/procedure(s) were performed by non-physician practitioner and as supervising physician I was immediately available for consultation/collaboration.    Johnna Acosta, MD 05/23/14 [phone]

## 2014-07-13 ENCOUNTER — Emergency Department (HOSPITAL_COMMUNITY): Payer: Medicaid Other

## 2014-07-13 ENCOUNTER — Emergency Department (HOSPITAL_COMMUNITY)
Admission: EM | Admit: 2014-07-13 | Discharge: 2014-07-13 | Disposition: A | Discharge status: Home / Self Care | Payer: Medicaid Other | Attending: Emergency Medicine | Admitting: Emergency Medicine

## 2014-07-13 ENCOUNTER — Encounter (HOSPITAL_COMMUNITY): Payer: Self-pay

## 2014-07-13 DIAGNOSIS — Z720 Tobacco use: Secondary | ICD-10-CM | POA: Insufficient documentation

## 2014-07-13 DIAGNOSIS — Z79899 Other long term (current) drug therapy: Secondary | ICD-10-CM | POA: Insufficient documentation

## 2014-07-13 DIAGNOSIS — Z905 Acquired absence of kidney: Secondary | ICD-10-CM | POA: Insufficient documentation

## 2014-07-13 DIAGNOSIS — Z791 Long term (current) use of non-steroidal anti-inflammatories (NSAID): Secondary | ICD-10-CM | POA: Insufficient documentation

## 2014-07-13 DIAGNOSIS — Z7952 Long term (current) use of systemic steroids: Secondary | ICD-10-CM | POA: Insufficient documentation

## 2014-07-13 DIAGNOSIS — G43909 Migraine, unspecified, not intractable, without status migrainosus: Secondary | ICD-10-CM | POA: Insufficient documentation

## 2014-07-13 DIAGNOSIS — R109 Unspecified abdominal pain: Secondary | ICD-10-CM | POA: Insufficient documentation

## 2014-07-13 DIAGNOSIS — M1990 Unspecified osteoarthritis, unspecified site: Secondary | ICD-10-CM | POA: Insufficient documentation

## 2014-07-13 DIAGNOSIS — Z85528 Personal history of other malignant neoplasm of kidney: Secondary | ICD-10-CM | POA: Insufficient documentation

## 2014-07-13 LAB — URINALYSIS, ROUTINE W REFLEX MICROSCOPIC
BILIRUBIN URINE: NEGATIVE
GLUCOSE, UA: NEGATIVE mg/dL
HGB URINE DIPSTICK: NEGATIVE
Ketones, ur: NEGATIVE mg/dL
Leukocytes, UA: NEGATIVE
Nitrite: NEGATIVE
PROTEIN: NEGATIVE mg/dL
Specific Gravity, Urine: 1.02 (ref 1.005–1.030)
Urobilinogen, UA: 0.2 mg/dL (ref 0.0–1.0)
pH: 5.5 (ref 5.0–8.0)

## 2014-07-13 MED ORDER — KETOROLAC TROMETHAMINE 30 MG/ML IJ SOLN
60.0000 mg | Freq: Once | INTRAMUSCULAR | Status: AC
  Administered 2014-07-13: 60 mg via INTRAMUSCULAR
  Filled 2014-07-13: qty 2

## 2014-07-13 MED ORDER — HYDROCODONE-ACETAMINOPHEN 5-325 MG PO TABS: 1.0000 | ORAL_TABLET | Freq: Four times a day (QID) | ORAL | Status: DC | PRN

## 2014-07-13 MED ORDER — KETOROLAC TROMETHAMINE 30 MG/ML IJ SOLN
INTRAMUSCULAR | Status: AC
  Filled 2014-07-13: qty 1

## 2014-07-13 NOTE — ED Notes (Signed)
Pt reporting mid & lower back pain has been going on for the past week. Pt denies any new injury or activity. Pt taking 800 mg ibuprofen & dexamethasone.

## 2014-07-13 NOTE — Discharge Instructions (Signed)
Ibuprofen 600 mg 3 times daily for the next 5 days.  Hydrocodone as prescribed as needed for pain not relieved with ibuprofen.  Follow-up with your primary Dr. if not improving in the next week.   Flank Pain Flank pain refers to pain that is located on the side of the body between the upper abdomen and the back. The pain may occur over a short period of time (acute) or may be long-term or reoccurring (chronic). It may be mild or severe. Flank pain can be caused by many things. CAUSES  Some of the more common causes of flank pain include:  Muscle strains.   Muscle spasms.   A disease of your spine (vertebral disk disease).   A lung infection (pneumonia).   Fluid around your lungs (pulmonary edema).   A kidney infection.   Kidney stones.   A very painful skin rash caused by the chickenpox virus (shingles).   Gallbladder disease.  [place] care will depend on the cause of your pain. In general,  Rest as directed by your caregiver.  Drink enough fluids to keep your urine clear or pale yellow.  Only take over-the-counter or prescription medicines as directed by your caregiver. Some medicines may help relieve the pain.  Tell your caregiver about any changes in your pain.  Follow up with your caregiver as directed. SEEK IMMEDIATE MEDICAL CARE IF:   Your pain is not controlled with medicine.   You have new or worsening symptoms.  Your pain increases.   You have abdominal pain.   You have shortness of breath.   You have persistent nausea or vomiting.   You have swelling in your abdomen.   You feel faint or pass out.   You have blood in your urine.  You have a fever or persistent symptoms for more than 2-3 days.  You have a fever and your symptoms suddenly get worse. MAKE SURE YOU:   Understand these instructions.  Will watch your condition.  Will get help right away if you are not doing well or get worse. Document  Released: 09/08/2005 Document Revised: 04/11/2012 Document Reviewed: 03/01/2012 [facility] Patient Information 2015 [place], [place]. This information is not intended to replace advice given to you by your health care provider. Make sure you discuss any questions you have with your health care provider.

## 2014-07-13 NOTE — ED Notes (Signed)
Pt reports sciatica pain for about a week, denies injury

## 2014-07-13 NOTE — ED Provider Notes (Signed)
CSN: [id number]     Arrival date & time 07/13/14  1856 History   First MD Initiated Contact with Patient 07/13/14 [phone]     Chief Complaint  Patient presents with  . Back Pain     (Consider location/radiation/quality/duration/timing/severity/associated sxs/prior Treatment) HPI Comments: Patient is a 48 year old male who presents with complaints of left flank pain. He has a history of a partial nephrectomy due to a cancer. He denies he is having any urinary complaints. He denies any blood in the urine and denies any fevers or chills. He has been treated recently for sciatica, however this feels different.  Patient is a 48 y.o. male presenting with back pain. The history is provided by the patient.  Back Pain Pain location: Left flank. Quality:  Stabbing Radiates to:  Does not radiate Pain severity:  Moderate Pain is:  Same all the time Onset quality:  Gradual Duration:  1 week Timing:  Constant Progression:  Worsening Chronicity:  New   Past Medical History  Diagnosis Date  . 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 Arthritis   . Cancer   . Migraines   . Former consumption of alcohol     quit 2013   Past Surgical History  Procedure Laterality Date  . Mandible fracture surgery  age 64  . Robotic assited partial nephrectomy Left 2012   Family History  Problem Relation Age of Onset  . Cancer Mother    History  Substance Use Topics  . Smoking status: Current Some Day Smoker -- 1.00 packs/day for 31 years    Types: Cigarettes    Last Attempt to Quit: 09/20/2010  . Smokeless tobacco: Never Used  . Alcohol Use: Yes     Comment: used to/quit 2013    Review of Systems  Musculoskeletal: Positive for back pain.  All other systems reviewed and are negative.     Allergies  Review of patient's allergies indicates no known allergies.  Home Medications   Prior to Admission medications   Medication Sig Start Date End Date Taking? Authorizing  Provider  acetaminophen (TYLENOL) 500 MG tablet Take 1,000 mg by mouth daily as needed for mild pain or moderate pain.   Yes Historical Provider, MD  dexamethasone (DECADRON) 4 MG tablet 1 po bid with food 05/21/14  Yes Lenox Ahr, PA-C  metoprolol tartrate (LOPRESSOR) 25 MG tablet Take 25 mg by mouth 2 (two) times daily.   Yes Historical Provider, MD  diazepam (VALIUM) 5 MG tablet 1 po tid for spasm 05/21/14   Lenox Ahr, PA-C  diclofenac (VOLTAREN) 75 MG EC tablet Take 1 tablet (75 mg total) by mouth 2 (two) times daily. 05/21/14   Lenox Ahr, PA-C  gemfibrozil (LOPID) 600 MG tablet Take 600 mg by mouth 2 (two) times daily before a meal.    Historical Provider, MD  HYDROcodone-acetaminophen (NORCO/VICODIN) 5-325 MG per tablet 1 at hs or q4h prn pain 05/21/14   Lenox Ahr, PA-C   BP 124/85 mmHg  Pulse 66  Temp(Src) 97.6 F (36.4 C) (Oral)  Resp 20  Ht 5\' 6"  (1.676 m)  Wt 165 lb (74.844 kg)  BMI 26.64 kg/m2  SpO2 100% Physical Exam  Constitutional: He is oriented to person, place, and time. He appears well-developed and well-nourished. No distress.  HENT:  Head: Normocephalic and atraumatic.  Neck: Normal range of motion. Neck supple.  Musculoskeletal: Normal range of motion.  There is tenderness to palpation in  the left flank and left paraspinal musculature in the lower thoracic region.  Neurological: He is alert and oriented to person, place, and time.  DTRs are 2+ and symmetrical in the bilateral lower extremities. Strength is 5 out of 5 in the bilateral lower extremities. He is able to ambulate without difficulty.  Skin: Skin is warm and dry. He is not diaphoretic.  Nursing note and vitals reviewed.   ED Course  Procedures (including critical care time) Labs Review Labs Reviewed  URINALYSIS, ROUTINE W REFLEX MICROSCOPIC    Imaging Review No results found.   EKG Interpretation None      MDM   Final diagnoses:  None    Patient presents with  complaints of flank pain which appears musculoskeletal clinically. His workup reveals a clear urinalysis with no blood or infection and CT scan which reveals no acute intra-abdominal process. There is a question of a small bowel loop possibly distended, however small bowel obstruction does not fit the clinical picture and I do not believe this to be relevant. Patient was given Toradol and I believe is appropriate for discharge. He will given pain medication and follow-up with his primary doctor.    Veryl Speak, MD 07/13/14 [phone]

## 2014-07-17 ENCOUNTER — Emergency Department (HOSPITAL_COMMUNITY)
Admission: EM | Admit: 2014-07-17 | Discharge: 2014-07-17 | Disposition: A | Discharge status: Home / Self Care | Payer: Medicaid Other | Attending: Emergency Medicine | Admitting: Emergency Medicine

## 2014-07-17 ENCOUNTER — Emergency Department (HOSPITAL_COMMUNITY): Payer: Medicaid Other

## 2014-07-17 ENCOUNTER — Encounter (HOSPITAL_COMMUNITY): Payer: Self-pay | Admitting: Emergency Medicine

## 2014-07-17 DIAGNOSIS — Z8679 Personal history of other diseases of the circulatory system: Secondary | ICD-10-CM | POA: Insufficient documentation

## 2014-07-17 DIAGNOSIS — M25569 Pain in unspecified knee: Secondary | ICD-10-CM

## 2014-07-17 DIAGNOSIS — S80211A Abrasion, right knee, initial encounter: Secondary | ICD-10-CM | POA: Insufficient documentation

## 2014-07-17 DIAGNOSIS — Z79899 Other long term (current) drug therapy: Secondary | ICD-10-CM | POA: Insufficient documentation

## 2014-07-17 DIAGNOSIS — Z85528 Personal history of other malignant neoplasm of kidney: Secondary | ICD-10-CM | POA: Insufficient documentation

## 2014-07-17 DIAGNOSIS — M1990 Unspecified osteoarthritis, unspecified site: Secondary | ICD-10-CM | POA: Insufficient documentation

## 2014-07-17 DIAGNOSIS — Y998 Other external cause status: Secondary | ICD-10-CM | POA: Insufficient documentation

## 2014-07-17 DIAGNOSIS — Z7952 Long term (current) use of systemic steroids: Secondary | ICD-10-CM | POA: Insufficient documentation

## 2014-07-17 DIAGNOSIS — W11XXXA Fall on and from ladder, initial encounter: Secondary | ICD-10-CM | POA: Insufficient documentation

## 2014-07-17 DIAGNOSIS — Z87448 Personal history of other diseases of urinary system: Secondary | ICD-10-CM | POA: Insufficient documentation

## 2014-07-17 DIAGNOSIS — S8391XA Sprain of unspecified site of right knee, initial encounter: Secondary | ICD-10-CM | POA: Insufficient documentation

## 2014-07-17 DIAGNOSIS — Y929 Unspecified place or not applicable: Secondary | ICD-10-CM | POA: Insufficient documentation

## 2014-07-17 DIAGNOSIS — Y9389 Activity, other specified: Secondary | ICD-10-CM | POA: Insufficient documentation

## 2014-07-17 MED ORDER — OXYCODONE-ACETAMINOPHEN 5-325 MG PO TABS
ORAL_TABLET | ORAL | Status: AC
  Filled 2014-07-17: qty 1

## 2014-07-17 MED ORDER — TETANUS-DIPHTH-ACELL PERTUSSIS 5-2.5-18.5 LF-MCG/0.5 IM SUSP
0.5000 mL | Freq: Once | INTRAMUSCULAR | Status: AC
  Administered 2014-07-17: 0.5 mL via INTRAMUSCULAR
  Filled 2014-07-17: qty 0.5

## 2014-07-17 MED ORDER — OXYCODONE-ACETAMINOPHEN 5-325 MG PO TABS
1.0000 | ORAL_TABLET | Freq: Once | ORAL | Status: AC
  Administered 2014-07-17: 1 via ORAL
  Filled 2014-07-17: qty 1

## 2014-07-17 NOTE — ED Notes (Signed)
Registration called back to Nurse's station. Patient seen getting on moped. RN had advised patient earlier that he was not able to drive due to receiving percocet in ED. Patient had given verbal understanding that he could not drive for 4 hours after receiving medication. Registration/security to call [employer] to report patient seen driving away.

## 2014-07-17 NOTE — ED Notes (Signed)
Patient with no complaints at this time. Respirations even and unlabored. Skin warm/dry. Discharge instructions reviewed with patient at this time. Patient given opportunity to voice concerns/ask questions. Patient discharged at this time and left Emergency Department via wheelchair. 

## 2014-07-17 NOTE — ED Notes (Signed)
Patient c/o right knee pain. Per patient hit knee on ladder yesterday, scabbed abrasion noted-no drainage.Per patient taken ibuprofen with no relief. Unsure of last tetanus vaccine.

## 2014-07-17 NOTE — ED Provider Notes (Signed)
CSN: [id number]     Arrival date & time 07/17/14  3790 History  This chart was scribed for Sharyon Cable, MD by Tula Nakayama, ED Scribe. This patient was seen in room APA10/AP[facility]10 and the patient's care was started at 8:46 AM.     Chief Complaint  Patient presents with  . Knee Pain   Patient is a 48 y.o. male presenting with knee pain. The history is provided by the patient. No language interpreter was used.  Knee Pain Location:  Knee Time since incident:  1 day Injury: yes   Mechanism of injury: fall   Fall:    Fall occurred: from porch.   Height of fall:  2 feet   Impact surface:  Unable to specify   Point of impact:  Feet   Entrapped after fall: no   Knee location:  R knee Pain details:    Quality:  Unable to specify   Radiates to:  Does not radiate   Severity:  Moderate   Onset quality:  Gradual   Duration:  1 day   Timing:  Constant   Progression:  Unchanged Chronicity:  New Dislocation: no   Foreign body present:  No foreign bodies Tetanus status:  Unknown Prior injury to area:  Unable to specify Relieved by:  Nothing Worsened by:  Activity Ineffective treatments:  Ice Associated symptoms: no back pain and no neck pain     HPI Comments: Kevin [name] is a 48 y.o. male who presents to the Emergency Department complaining of constant, moderate right knee pain that started after an injury that occurred yesterday. He notes an abrasion on his right knee as an associated symptom. Pt states he stepped off a ladder and fell 2 feet off of a porch. He hit his right knee on the ladder during the fall, but denies hitting his head or losing consciousness. Pt has tried ice with no relief. He states that pain becomes worse with movement. Pt reports chronic left-sided back pain that is unchanged today. He takes dexamethasone for his symptoms. Pt does not know the date of his last tetanus shot. He denies neck pain, head pain or new back pain as associated symptoms.  Past  Medical History  Diagnosis Date  . 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 Arthritis   . Cancer   . Migraines   . Former consumption of alcohol     quit 2013   Past Surgical History  Procedure Laterality Date  . Mandible fracture surgery  age 62  . Robotic assited partial nephrectomy Left 2012   Family History  Problem Relation Age of Onset  . Cancer Mother    History  Substance Use Topics  . Smoking status: Current Some Day Smoker -- 1.00 packs/day for 31 years    Types: Cigarettes    Last Attempt to Quit: 09/20/2010  . Smokeless tobacco: Never Used  . Alcohol Use: Yes     Comment: used to/quit 2013    Review of Systems  Musculoskeletal: Positive for arthralgias. Negative for back pain and neck pain.  Skin: Positive for wound.  Neurological: Negative for headaches.  All other systems reviewed and are negative.  Allergies  Review of patient's allergies indicates no known allergies.  Home Medications   Prior to Admission medications   Medication Sig Start Date End Date Taking? Authorizing Provider  acetaminophen (TYLENOL) 500 MG tablet Take 1,000 mg by mouth daily as needed for mild pain  or moderate pain.    Historical Provider, MD  dexamethasone (DECADRON) 4 MG tablet 1 po bid with food 05/21/14   Lenox Ahr, PA-C  diazepam (VALIUM) 5 MG tablet 1 po tid for spasm 05/21/14   Lenox Ahr, PA-C  diclofenac (VOLTAREN) 75 MG EC tablet Take 1 tablet (75 mg total) by mouth 2 (two) times daily. 05/21/14   Lenox Ahr, PA-C  gemfibrozil (LOPID) 600 MG tablet Take 600 mg by mouth 2 (two) times daily before a meal.    Historical Provider, MD  HYDROcodone-acetaminophen (NORCO) 5-325 MG per tablet Take 1-2 tablets by mouth every 6 (six) hours as needed. 07/13/14   Veryl Speak, MD  metoprolol tartrate (LOPRESSOR) 25 MG tablet Take 25 mg by mouth 2 (two) times daily.    Historical Provider, MD   BP 118/70 mmHg  Pulse 85  Temp(Src) 97.7 F  (36.5 C) (Oral)  Resp 18  Ht 5\' 6"  (1.676 m)  Wt 165 lb (74.844 kg)  BMI 26.64 kg/m2  SpO2 98% Physical Exam  Nursing note and vitals reviewed.   CONSTITUTIONAL: Well developed/well nourished HEAD: Normocephalic/atraumatic NECK: supple no meningeal signs SPINE/BACK:entire spine nontender CV: S1/S2 noted, no murmurs/rubs/gallops noted LUNGS: Lungs are clear to auscultation bilaterally, no apparent distress ABDOMEN: soft, nontender, no rebound or guarding, bowel sounds noted throughout abdomen GU:no cva tenderness NEURO: Pt is awake/alert/appropriate, moves all extremitiesx4.  No facial droop.   EXTREMITIES: pulses normal/equal, full ROM; small abrasion to right patella; tenderness noted throughout the knee, no deformity; pt is able to flex and extend knee; no ankle tenderness noted SKIN: warm, color normal PSYCH: no abnormalities of mood noted, alert and oriented to situation   ED Course  Procedures  DIAGNOSTIC STUDIES: Oxygen Saturation is 99% on RA, normal by my interpretation.    COORDINATION OF CARE: 8:54 AM Discussed treatment plan with pt which includes an x-ray of his right knee and TDAP. Pt agreed to plan.  Imaging Review Dg Knee Complete 4 Views Right  07/17/2014   CLINICAL DATA:  Right knee pain.  Injury yesterday on ladder.  EXAM: RIGHT KNEE - COMPLETE 4+ VIEW  COMPARISON:  None.  FINDINGS: No acute fracture or dislocation. Joint spaces maintained. Minimal enthesopathic change at the quadriceps insertion. Mild prepatellar soft tissue thickening is suggested. No joint effusion.  IMPRESSION: No acute osseous abnormality.   Electronically Signed   By: Abigail Miyamoto M.D.   On: 07/17/2014 09:41   Medications  Tdap (BOOSTRIX) injection 0.5 mL (0.5 mLs Intramuscular Given 07/17/14 0905)  oxyCODONE-acetaminophen (PERCOCET/ROXICET) 5-325 MG per tablet 1 tablet (1 tablet Oral Given 07/17/14 0906)  oxyCODONE-acetaminophen (PERCOCET/ROXICET) 5-325 MG per tablet (  Duplicate  91/79/15 0569)     MDM   Final diagnoses:  Knee pain  Right knee sprain, initial encounter  Abrasion of right knee, initial encounter    Nursing notes including past medical history and social history reviewed and considered in documentation xrays/imaging reviewed by myself and considered during evaluation   I personally performed the services described in this documentation, which was scribed in my presence. The recorded information has been reviewed and is accurate.       Sharyon Cable, MD 07/17/14 [phone]

## 2014-08-13 ENCOUNTER — Emergency Department (HOSPITAL_COMMUNITY)
Admission: EM | Admit: 2014-08-13 | Discharge: 2014-08-13 | Disposition: A | Discharge status: Home / Self Care | Payer: No Typology Code available for payment source | Attending: Emergency Medicine | Admitting: Emergency Medicine

## 2014-08-13 ENCOUNTER — Encounter (HOSPITAL_COMMUNITY): Payer: Self-pay | Admitting: *Deleted

## 2014-08-13 DIAGNOSIS — G43909 Migraine, unspecified, not intractable, without status migrainosus: Secondary | ICD-10-CM | POA: Insufficient documentation

## 2014-08-13 DIAGNOSIS — M62830 Muscle spasm of back: Secondary | ICD-10-CM | POA: Insufficient documentation

## 2014-08-13 DIAGNOSIS — M1990 Unspecified osteoarthritis, unspecified site: Secondary | ICD-10-CM | POA: Insufficient documentation

## 2014-08-13 DIAGNOSIS — G8929 Other chronic pain: Secondary | ICD-10-CM | POA: Insufficient documentation

## 2014-08-13 DIAGNOSIS — Z8589 Personal history of malignant neoplasm of other organs and systems: Secondary | ICD-10-CM | POA: Insufficient documentation

## 2014-08-13 DIAGNOSIS — M5432 Sciatica, left side: Secondary | ICD-10-CM | POA: Insufficient documentation

## 2014-08-13 DIAGNOSIS — Z720 Tobacco use: Secondary | ICD-10-CM | POA: Insufficient documentation

## 2014-08-13 DIAGNOSIS — Z87448 Personal history of other diseases of urinary system: Secondary | ICD-10-CM | POA: Insufficient documentation

## 2014-08-13 DIAGNOSIS — Z79899 Other long term (current) drug therapy: Secondary | ICD-10-CM | POA: Insufficient documentation

## 2014-08-13 MED ORDER — DIAZEPAM 5 MG PO TABS: ORAL_TABLET | ORAL | Status: DC

## 2014-08-13 MED ORDER — DEXAMETHASONE 4 MG PO TABS: 4.0000 mg | ORAL_TABLET | Freq: Two times a day (BID) | ORAL | Status: DC

## 2014-08-13 MED ORDER — CELECOXIB 100 MG PO CAPS: 100.0000 mg | ORAL_CAPSULE | Freq: Two times a day (BID) | ORAL | Status: DC

## 2014-08-13 NOTE — ED Provider Notes (Signed)
CSN: [id number]     Arrival date & time 08/13/14  1809 History   First MD Initiated Contact with Patient 08/13/14 1842     Chief Complaint  Patient presents with  . Back Pain     (Consider location/radiation/quality/duration/timing/severity/associated sxs/prior Treatment) HPI Comments: Patient is a 49 year old male who presents to the emergency department with a complaint of lower back pain. The patient states he has been having lower back pain for quite some time. 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 He has pain with minimal movement at home. He has not had any loss of bowel or bladder function. He's not had any recent fall in the last few days. 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 The patient states he has been referred to orthopedics, but states he does not have insurance, and he has been unable to make enough money to go to [facility]. He presents to the emergency department for assistance with this problem.  Patient is a 49 y.o. male presenting with back pain. The history is provided by the patient.  Back Pain Associated symptoms: no abdominal pain, no chest pain and no dysuria     Past Medical History  Diagnosis Date  . 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 Arthritis   . Cancer   . Migraines   . Former consumption of alcohol     quit 2013   Past Surgical History  Procedure Laterality Date  . Mandible fracture surgery  age 54  . Robotic assited partial nephrectomy Left 2012   Family History  Problem Relation Age of Onset  . Cancer Mother    History  Substance Use Topics  . Smoking status: Current Some Day Smoker -- 1.00 packs/day for 31 years    Types: Cigarettes  . Smokeless tobacco: Never Used  . Alcohol  Use: No     Comment: used to/quit 2013    Review of Systems  Constitutional: Negative for activity change.       All ROS Neg except as noted in HPI  HENT: Negative for nosebleeds.   Eyes: Negative for photophobia and discharge.  Respiratory: Negative for cough, shortness of breath and wheezing.   Cardiovascular: Negative for chest pain and palpitations.  Gastrointestinal: Negative for abdominal pain and blood in stool.  Genitourinary: Negative for dysuria, frequency and hematuria.  Musculoskeletal: Positive for back pain and arthralgias. Negative for neck pain.  Skin: Negative.   Neurological: Negative for dizziness, seizures and speech difficulty.  Psychiatric/Behavioral: Negative for hallucinations and confusion.      Allergies  Review of patient's allergies indicates no known allergies.  Home Medications   Prior to Admission medications   Medication Sig Start Date End Date Taking? Authorizing Provider  acetaminophen (TYLENOL) 500 MG tablet Take 1,000 mg by mouth daily as needed for mild pain or moderate pain.    Historical Provider, MD  dexamethasone (DECADRON) 4 MG tablet 1 po bid with food Patient not taking: Reported on 07/17/2014 05/21/14   Lenox Ahr, PA-C  diazepam (VALIUM) 5 MG tablet 1 po tid for spasm Patient not taking: Reported on 07/17/2014 05/21/14   Lenox Ahr, PA-C  diclofenac (VOLTAREN) 75  MG EC tablet Take 1 tablet (75 mg total) by mouth 2 (two) times daily. Patient not taking: Reported on 07/17/2014 05/21/14   Lenox Ahr, PA-C  gemfibrozil (LOPID) 600 MG tablet Take 600 mg by mouth 2 (two) times daily before a meal.    Historical Provider, MD  HYDROcodone-acetaminophen (NORCO) 5-325 MG per tablet Take 1-2 tablets by mouth every 6 (six) hours as needed. 07/13/14   Veryl Speak, MD  metoprolol tartrate (LOPRESSOR) 25 MG tablet Take 25 mg by mouth 2 (two) times daily.    Historical Provider, MD   BP 119/58 mmHg  Pulse 87  Temp(Src)   Resp 20   Ht 5\' 6"  (1.676 m)  Wt 170 lb (77.111 kg)  BMI 27.45 kg/m2  SpO2 100% Physical Exam  Constitutional: He is oriented to person, place, and time. He appears well-developed and well-nourished.  Non-toxic appearance.  HENT:  Head: Normocephalic.  Right Ear: Tympanic membrane and external ear normal.  Left Ear: Tympanic membrane and external ear normal.  Eyes: EOM and lids are normal. Pupils are equal, round, and reactive to light.  Neck: Normal range of motion. Neck supple. Carotid bruit is not present.  Cardiovascular: Normal rate, regular rhythm, normal heart sounds, intact distal pulses and normal pulses.   Pulmonary/Chest: Breath sounds normal. No respiratory distress.  Abdominal: Soft. Bowel sounds are normal. There is no tenderness. There is no guarding.  Musculoskeletal:       Lumbar back: He exhibits decreased range of motion, tenderness, pain and spasm.  Pain with SLR into the left leg at 30 degrees.  Lymphadenopathy:       Head (right side): No submandibular adenopathy present.       Head (left side): No submandibular adenopathy present.    He has no cervical adenopathy.  Neurological: He is alert and oriented to person, place, and time. He has normal strength. No cranial nerve deficit or sensory deficit.  Skin: Skin is warm and dry.  Psychiatric: He has a normal mood and affect. His speech is normal.  Nursing note and vitals reviewed.   ED Course  Procedures (including critical care time) Labs Review Labs Reviewed - No data to display  Imaging Review No results found.   EKG Interpretation None      MDM  Vital signs within normal limits. The patient has back pain with range of motion, and especially with straight leg raising. His exam is mostly unchanged, no new or acute neurovascular deficits appreciated.  I discussed the patient again that it's important that he is seen by an orthopedic specialist for additional evaluation. I have also suggested to the patient  the possibility of seeing one of the clinics at one of the [place] centers. Prescription for Decadron, Valium, and Celebrex given to the patient.    Final diagnoses:  Chronic sciatica, left    *I have reviewed nursing notes, vital signs, and all appropriate lab and imaging results for this patient.    Lenox Ahr, PA-C 08/13/14 1926  Johnna Acosta, MD 08/13/14 2004

## 2014-08-13 NOTE — ED Notes (Signed)
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

## 2014-08-13 NOTE — Discharge Instructions (Signed)
Please alternate heat and ice to your lower back. It is very important that you see one of the orthopedic specialist as sone as possible concerning your back. Please rest your back is much as possible. Please use medications as suggested. Value may cause drowsiness, please use with caution. Sciatica Sciatica is pain, weakness, numbness, or tingling along your sciatic nerve. The nerve starts in the lower back and runs down the back of each leg. Nerve damage or certain conditions pinch or put pressure on the sciatic nerve. This causes the pain, weakness, and other discomforts of sciatica. HOME CARE   Only take medicine as told by your doctor.  Apply ice to the affected area for 20 minutes. Do this 3-4 times a day for the first 48-72 hours. Then try heat in the same way.  Exercise, stretch, or do your usual activities if these do not make your pain worse.  Go to physical therapy as told by your doctor.  Keep all doctor visits as told.  Do not wear high heels or shoes that are not supportive.  Get a firm mattress if your mattress is too soft to lessen pain and discomfort. GET HELP RIGHT AWAY IF:   You cannot control when you poop (bowel movement) or pee (urinate).  You have more weakness in your lower back, lower belly (pelvis), butt (buttocks), or legs.  You have redness or puffiness (swelling) of your back.  You have a burning feeling when you pee.  You have pain that gets worse when you lie down.  You have pain that wakes you from your sleep.  Your pain is worse than past pain.  Your pain lasts longer than 4 weeks.  You are suddenly losing weight without reason. MAKE SURE YOU:   Understand these instructions.  Will watch this condition.  Will get help right away if you are not doing well or get worse. Document Released: 04/26/2008 Document Revised: 01/17/2012 Document Reviewed: 11/27/2011 [facility] Patient Information 2015 [place], [place]. This information is not intended  to replace advice given to you by your health care provider. Make sure you discuss any questions you have with your health care provider.

## 2014-11-18 NOTE — H&P (Signed)
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  The patient is divorced twice and lives with his father, who is 16 years old.  The patient and father live in a house that has three bedrooms.    CHIEF COMPLAINT: The patient comes for his first inpatient on Psychiatry at [facility] with the chief complaint, "I have been drinking too much alcohol.  I need to come out of it. I thought I was sober and could not stop and started drinking again."  HISTORY OF PRESENT ILLNESS:  The patient reports that he has a longstanding history of alcohol dependence and has been sober for 1-1/2 years by going to [employer], and he stopped. Recently he started drinking alcohol at the rate of 18-pack of beer and on top of it 1/2 pint of hard liquor on an everyday basis and has been drinking at this rate for the past 2-1/2 months and said, "it is too much," and he came here for help.   PAST PSYCHIATRIC HISTORY: No previous history of inpatient hospitalization on Psychiatry. No history of suicide attempts. He is not being followed by any psychiatrist at this time. He had been to [facility] before and had been to [employer] and took help from [facility].   FAMILY HISTORY OF MENTAL ILLNESS: None known for mental illness. No known history of suicides in the family.   FAMILY HISTORY: He was raised by parents. Father was a Hotel manager. Father is living and is retired and 46 years old. Mother worked in a factory. Mother died of cancer of throat at age 40 years. He has three siblings, close to family.   PERSONAL HISTORY: He was born in [place], [place]. He dropped out in the 10th or 11th grade and can't remember the details. He got  his GED later, no college.   WORK HISTORY: First job was at the same [employer] where his mother worked at a young age of 51 years. This job lasted for a while. The longest job he has worked was his current job at [employer], and he makes and sets up fairs.   MILITARY HISTORY: None.  MARRIAGES: Married twice. First marriage lasted for 10 years. Cause of divorce was they could not get along and she left. He has one son who is 69 years old. He is in touch with his son. His second marriage lasted 10 years. Cause of divorce was she left him because of his drinking, has two children that are 82 years old.  He is in touch with them.  ALCOHOL AND DRUGS: First drink of alcohol was at age 55 years. It became a problem several years later.  He admits that he drinks and stays drunk. He drank for many years.  He does admit to DTs and tremors. No history of seizures. He had 8 DWIs, lost his driver's license, and he could get it back. He does admit to getting arrested for public drunkenness and cannot remember how many times, too many times to count. Last drink was just prior to coming to the hospital. He denies using street or prescription drugs. He denies using IV drugs. He does admit smoking nicotine cigarettes for many  years and currently uses E cigarettes.   MEDICAL HISTORY: He has high blood pressure. No known diabetes mellitus. Status post surgery on the left kidney for cancer of the kidney and done by Dr. [name] in [place], [place]. He has an appointment coming up for followup.   ALLERGIES: No known drug allergies.   PRIMARY CARE PHYSICIAN: He is being followed by Dr. Legrand Rams in [place], [place]. His last appointment was two months ago. Next appointment is to be made.   PHYSICAL EXAMINATION: VITAL SIGNS: Temperature 99.7, pulse is 98 per minute and regular, respirations 20 per minute and regular, blood pressure is 112/90 mmHg.   HEENT: Head is normocephalic and atraumatic. Eyes:  Pupils equal, round, and reactive to light and accommodation. Fundi are bilaterally benign. EOMS visualized. Tympanic membranes show no exudate.   NECK: Supple without any organomegaly, lymphadenopathy, or thyromegaly.  CHEST: Normal expansion. Normal breath sounds heard.  HEART: Normal S1, S2 without any murmurs or gallops.  ABDOMEN: Soft. No organomegaly. Scar from previous surgery for cancer of the kidney, healed well.   RECTAL: Examination deferred.   SKIN: Normal turgor. No rash. Warm and dry.  EXTREMITIES: Nontender. Normal range of motion. No pedal edema.   NEUROLOGIC: Gait is normal. Romberg is negative. Cranial nerves II through XII are intact. Deep tendon reflexes are 2+. Plantars are normal response.   MENTAL STATUS EXAMINATION: The patient is dressed in street clothes. He is alert and oriented to place, person, and time, cooperative. Affect is appropriate with his mood, which is low and down and feeling down about his drinking and wants to give it up. He denies feeling hopeless or helpless. He denies feeling worthless or useless. He denies suicidal or homicidal ideas or plans. No evident psychosis. He denies auditory or visual hallucinations. Thought processes are logical and goal-directed. Cognition is intact. Memory is intact. He could spell the word "world" forward and backward without any problems. Recall and memory are good. Abstract interpretations are fair and adequate. He does admit to sleep and appetite disturbance and does not eat well when he drinks alcohol all the time. Insight and judgment are guarded.   IMPRESSION: AXIS I:   1. Alcohol dependence, chronic and continuous with intoxication. 2. Nicotine dependence.  AXIS II:  Deferred.  AXIS III:   Status post surgery for kidney cancer on the left side.   AXIS IV:   Severe, long history of alcohol drinking and stays drunk for months and weeks.   AXIS V:   Global Assessment of Functioning score is 25.   PLAN: The  patient is admitted to [facility] for close observation and management.  He will be started on CIWA  protocol and will be given medications to help him rest and for symptomatic relief. During the stay in the hospital he will be given milieu therapy  and supportive counseling. He will take part in individual group therapy and substance abuse counseling will be added.  At the time of discharge, he will be detoxed and will not have any withdrawal symptoms or cravings for alcohol.  Appropriate follow-up appointments will be made at the time of discharge along with substance abuse followup as per his disposition.   ____________________________ Wallace Cullens. Franchot Mimes, MD skc:[id number] D: 04/07/2012 19:39:00 ET T: 04/08/2012 08:07:14 ET JOB#: [id number]  cc: Arlyn Leak K. Franchot Mimes, MD, <Dictator> Dewain Penning MD ELECTRONICALLY SIGNED 04/08/2012 17:51

## 2015-03-09 ENCOUNTER — Emergency Department (HOSPITAL_COMMUNITY)
Admission: EM | Admit: 2015-03-09 | Discharge: 2015-03-09 | Disposition: A | Discharge status: Home / Self Care | Payer: Self-pay | Attending: Emergency Medicine | Admitting: Emergency Medicine

## 2015-03-09 ENCOUNTER — Encounter (HOSPITAL_COMMUNITY): Payer: Self-pay | Admitting: Emergency Medicine

## 2015-03-09 ENCOUNTER — Emergency Department (HOSPITAL_COMMUNITY): Payer: Self-pay

## 2015-03-09 DIAGNOSIS — Z85528 Personal history of other malignant neoplasm of kidney: Secondary | ICD-10-CM | POA: Insufficient documentation

## 2015-03-09 DIAGNOSIS — Y9389 Activity, other specified: Secondary | ICD-10-CM | POA: Insufficient documentation

## 2015-03-09 DIAGNOSIS — Z720 Tobacco use: Secondary | ICD-10-CM | POA: Insufficient documentation

## 2015-03-09 DIAGNOSIS — Y9289 Other specified places as the place of occurrence of the external cause: Secondary | ICD-10-CM | POA: Insufficient documentation

## 2015-03-09 DIAGNOSIS — S20211A Contusion of right front wall of thorax, initial encounter: Secondary | ICD-10-CM | POA: Insufficient documentation

## 2015-03-09 DIAGNOSIS — Y998 Other external cause status: Secondary | ICD-10-CM | POA: Insufficient documentation

## 2015-03-09 DIAGNOSIS — W1839XA Other fall on same level, initial encounter: Secondary | ICD-10-CM | POA: Insufficient documentation

## 2015-03-09 DIAGNOSIS — Z79899 Other long term (current) drug therapy: Secondary | ICD-10-CM | POA: Insufficient documentation

## 2015-03-09 DIAGNOSIS — Z8739 Personal history of other diseases of the musculoskeletal system and connective tissue: Secondary | ICD-10-CM | POA: Insufficient documentation

## 2015-03-09 DIAGNOSIS — Z8679 Personal history of other diseases of the circulatory system: Secondary | ICD-10-CM | POA: Insufficient documentation

## 2015-03-09 DIAGNOSIS — Z87448 Personal history of other diseases of urinary system: Secondary | ICD-10-CM | POA: Insufficient documentation

## 2015-03-09 MED ORDER — OXYCODONE-ACETAMINOPHEN 5-325 MG PO TABS
2.0000 | ORAL_TABLET | Freq: Once | ORAL | Status: AC
  Administered 2015-03-09: 2 via ORAL
  Filled 2015-03-09: qty 2

## 2015-03-09 MED ORDER — LEVOFLOXACIN 750 MG PO TABS
750.0000 mg | ORAL_TABLET | Freq: Once | ORAL | Status: AC
  Administered 2015-03-09: 750 mg via ORAL
  Filled 2015-03-09: qty 1

## 2015-03-09 MED ORDER — OXYCODONE-ACETAMINOPHEN 5-325 MG PO TABS: 2.0000 | ORAL_TABLET | ORAL | Status: DC | PRN

## 2015-03-09 MED ORDER — LEVOFLOXACIN 500 MG PO TABS: 500.0000 mg | ORAL_TABLET | Freq: Every day | ORAL | Status: DC
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
## 2015-03-09 NOTE — Discharge Instructions (Signed)
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
## 2015-04-07 ENCOUNTER — Encounter (HOSPITAL_COMMUNITY): Payer: Self-pay | Admitting: Emergency Medicine

## 2015-04-07 ENCOUNTER — Emergency Department (HOSPITAL_COMMUNITY)
Admission: EM | Admit: 2015-04-07 | Discharge: 2015-04-07 | Disposition: A | Discharge status: Home / Self Care | Payer: Self-pay | Attending: Emergency Medicine | Admitting: Emergency Medicine

## 2015-04-07 DIAGNOSIS — Z79899 Other long term (current) drug therapy: Secondary | ICD-10-CM | POA: Insufficient documentation

## 2015-04-07 DIAGNOSIS — R531 Weakness: Secondary | ICD-10-CM | POA: Insufficient documentation

## 2015-04-07 DIAGNOSIS — Z8669 Personal history of other diseases of the nervous system and sense organs: Secondary | ICD-10-CM | POA: Insufficient documentation

## 2015-04-07 DIAGNOSIS — Z8619 Personal history of other infectious and parasitic diseases: Secondary | ICD-10-CM | POA: Insufficient documentation

## 2015-04-07 DIAGNOSIS — R55 Syncope and collapse: Secondary | ICD-10-CM | POA: Insufficient documentation

## 2015-04-07 DIAGNOSIS — Z792 Long term (current) use of antibiotics: Secondary | ICD-10-CM | POA: Insufficient documentation

## 2015-04-07 DIAGNOSIS — R630 Anorexia: Secondary | ICD-10-CM | POA: Insufficient documentation

## 2015-04-07 DIAGNOSIS — Z85528 Personal history of other malignant neoplasm of kidney: Secondary | ICD-10-CM | POA: Insufficient documentation

## 2015-04-07 DIAGNOSIS — Z8739 Personal history of other diseases of the musculoskeletal system and connective tissue: Secondary | ICD-10-CM | POA: Insufficient documentation

## 2015-04-07 DIAGNOSIS — R5383 Other fatigue: Secondary | ICD-10-CM | POA: Insufficient documentation

## 2015-04-07 DIAGNOSIS — Z720 Tobacco use: Secondary | ICD-10-CM | POA: Insufficient documentation

## 2015-04-07 DIAGNOSIS — I10 Essential (primary) hypertension: Secondary | ICD-10-CM | POA: Insufficient documentation

## 2015-04-07 LAB — COMPREHENSIVE METABOLIC PANEL
ALK PHOS: 48 U/L (ref 38–126)
ALT: 40 U/L (ref 17–63)
AST: 47 U/L — AB (ref 15–41)
Albumin: 4.3 g/dL (ref 3.5–5.0)
Anion gap: 6 (ref 5–15)
BUN: 20 mg/dL (ref 6–20)
CALCIUM: 9.5 mg/dL (ref 8.9–10.3)
CHLORIDE: 104 mmol/L (ref 101–111)
CO2: 27 mmol/L (ref 22–32)
CREATININE: 1.18 mg/dL (ref 0.61–1.24)
GFR calc non Af Amer: >60 mL/min (ref >60)
GLUCOSE: 105 mg/dL — AB (ref 65–99)
Potassium: 4.1 mmol/L (ref 3.5–5.1)
SODIUM: 137 mmol/L (ref 135–145)
Total Bilirubin: 0.6 mg/dL (ref 0.3–1.2)
Total Protein: 7.2 g/dL (ref 6.5–8.1)

## 2015-04-07 LAB — CBC WITH DIFFERENTIAL/PLATELET
Basophils Absolute: 0 10*3/uL (ref 0.0–0.1)
Basophils Relative: 1 % (ref 0–1)
EOS ABS: 0.1 10*3/uL (ref 0.0–0.7)
EOS PCT: 2 % (ref 0–5)
HCT: 39.5 % (ref 39.0–52.0)
Hemoglobin: 14.4 g/dL (ref 13.0–17.0)
LYMPHS ABS: 1.1 10*3/uL (ref 0.7–4.0)
LYMPHS PCT: 22 % (ref 12–46)
MCH: 32.8 pg (ref 26.0–34.0)
MCHC: 36.5 g/dL — AB (ref 30.0–36.0)
MCV: 90 fL (ref 78.0–100.0)
MONO ABS: 0.4 10*3/uL (ref 0.1–1.0)
MONOS PCT: 8 % (ref 3–12)
Neutro Abs: 3.4 10*3/uL (ref 1.7–7.7)
Neutrophils Relative %: 67 % (ref 43–77)
PLATELETS: 135 10*3/uL — AB (ref 150–400)
RBC: 4.39 MIL/uL (ref 4.22–5.81)
RDW: 11.7 % (ref 11.5–15.5)
WBC: 5.1 10*3/uL (ref 4.0–10.5)

## 2015-04-07 LAB — TSH: TSH: 1.03 u[IU]/mL (ref 0.350–4.500)

## 2015-04-07 LAB — CBG MONITORING, ED: GLUCOSE-CAPILLARY: 107 mg/dL — AB (ref 65–99)

## 2015-04-07 LAB — TROPONIN I: Troponin I: <0.03 ng/mL (ref <0.031)

## 2015-04-07 NOTE — Discharge Instructions (Signed)
Drink plenty of fluids and get plenty of rest.  Follow-up with your primary Dr. if not improving in the next week.  We will call you if your blood work is abnormal and requires further intervention.   Fatigue Fatigue is a feeling of tiredness, lack of energy, lack of motivation, or feeling tired all the time. Having enough rest, good nutrition, and reducing stress will normally reduce fatigue. Consult your caregiver if it persists. The nature of your fatigue will help your caregiver to find out its cause. The treatment is based on the cause.  CAUSES  There are many causes for fatigue. Most of the time, fatigue can be traced to one or more of your habits or routines. Most causes fit into one or more of three general areas. They are: Lifestyle problems  Sleep disturbances.  Overwork.  Physical exertion.  Unhealthy habits.  Poor eating habits or eating disorders.  Alcohol and/or drug use .  Lack of proper nutrition (malnutrition). Psychological problems  Stress and/or anxiety problems.  Depression.  Grief.  Boredom. Medical Problems or Conditions  Anemia.  Pregnancy.  Thyroid gland problems.  Recovery from major surgery.  Continuous pain.  Emphysema or asthma that is not well controlled  Allergic conditions.  Diabetes.  Infections (such as mononucleosis).  Obesity.  Sleep disorders, such as sleep apnea.  Heart failure or other heart-related problems.  Cancer.  Kidney disease.  Liver disease.  Effects of certain medicines such as antihistamines, cough and cold remedies, prescription pain medicines, heart and blood pressure medicines, drugs used for treatment of cancer, and some antidepressants. SYMPTOMS  The symptoms of fatigue include:   Lack of energy.  Lack of drive (motivation).  Drowsiness.  Feeling of indifference to the surroundings. DIAGNOSIS  The details of how you feel help guide your caregiver in finding out what is causing the  fatigue. You will be asked about your present and past health condition. It is important to review all medicines that you take, including prescription and non-prescription items. A thorough exam will be done. You will be questioned about your feelings, habits, and normal lifestyle. Your caregiver may suggest blood tests, urine tests, or other tests to look for common medical causes of fatigue.  TREATMENT  Fatigue is treated by correcting the underlying cause. For example, if you have continuous pain or depression, treating these causes will improve how you feel. Similarly, adjusting the dose of certain medicines will help in reducing fatigue.  HOME CARE INSTRUCTIONS   Try to get the required amount of good sleep every night.  Eat a healthy and nutritious diet, and drink enough water throughout the day.  Practice ways of relaxing (including yoga or meditation).  Exercise regularly.  Make plans to change situations that cause stress. Act on those plans so that stresses decrease over time. Keep your work and personal routine reasonable.  Avoid street drugs and minimize use of alcohol.  Start taking a daily multivitamin after consulting your caregiver. SEEK MEDICAL CARE IF:   You have persistent tiredness, which cannot be accounted for.  You have fever.  You have unintentional weight loss.  You have headaches.  You have disturbed sleep throughout the night.  You are feeling sad.  You have constipation.  You have dry skin.  You have gained weight.  You are taking any new or different medicines that you suspect are causing fatigue.  You are unable to sleep at night.  You develop any unusual swelling of your legs or other  parts of your body. SEEK IMMEDIATE MEDICAL CARE IF:   You are feeling confused.  Your vision is blurred.  You feel faint or pass out.  You develop severe headache.  You develop severe abdominal, pelvic, or back pain.  You develop chest pain, shortness  of breath, or an irregular or fast heartbeat.  You are unable to pass a normal amount of urine.  You develop abnormal bleeding such as bleeding from the rectum or you vomit blood.  You have thoughts about harming yourself or committing suicide.  You are worried that you might harm someone else. MAKE SURE YOU:   Understand these instructions.  Will watch your condition.  Will get help right away if you are not doing well or get worse. Document Released: 05/15/2007 Document Revised: 10/10/2011 Document Reviewed: 11/19/2013 [facility] Patient Information 2015 [place], [place]. This information is not intended to replace advice given to you by your health care provider. Make sure you discuss any questions you have with your health care provider.

## 2015-04-07 NOTE — ED Notes (Signed)
Had mono 2 months ago, again feeling weak, nausea, decreased appetite, just feeling  bad all over

## 2015-04-07 NOTE — ED Provider Notes (Signed)
CSN: [id number]     Arrival date & time 04/07/15  1126 History  This chart was scribe for Veryl Speak, MD by Judithann Sauger, ED Scribe. The patient was seen in room APA17/APA17 and the patient's care was started at 12:20 PM.    Chief Complaint  Patient presents with  . Generalized Body Aches   Patient is a 49 y.o. male presenting with weakness. The history is provided by the patient. No language interpreter was used.  Weakness This is a new problem. The current episode started more than 1 week ago. The problem occurs constantly. The problem has not changed since onset.Pertinent negatives include no chest pain and no shortness of breath. The symptoms are aggravated by walking, stress and exertion. Nothing relieves the symptoms. He has tried rest for the symptoms.   HPI Comments: JOE GEE is a 49 y.o. male with a hx of HTN who presents to the Emergency Department complaining of generalized fatigue with no energy onset 2 months ago. He reports associated decreased appetite, one episode of syncope, and weakness. He denies any black or tarry stool or blood in stool. He explains that his symptoms are worse with exertion. He states that he has been trying to exercise and take vitamins but no relief. He reports that he was diagnosed with Mono when he was in [facility] about 3 month ago but did not rest as he was asked to do. He denies any new medication or a change in his HTN medication. He denies any sick contacts at home. He adds that his brother was diagnosed with MS.    Past Medical History  Diagnosis Date  . 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 Arthritis   . Cancer   . Migraines   . Former consumption of alcohol     quit 2013   Past Surgical History  Procedure Laterality Date  . Mandible fracture surgery  age 29  . Robotic assited partial nephrectomy Left 2012   Family History  Problem Relation Age of Onset  . Cancer Mother    Social History   Substance Use Topics  . Smoking status: Current Some Day Smoker -- 1.00 packs/day for 31 years    Types: Cigarettes  . Smokeless tobacco: Never Used  . Alcohol Use: No     Comment: used to/quit 2013    Review of Systems  Constitutional: Positive for appetite change and fatigue.  Respiratory: Negative for shortness of breath.   Cardiovascular: Negative for chest pain.  Gastrointestinal: Negative for nausea, vomiting, diarrhea and blood in stool.  Neurological: Positive for weakness.  All other systems reviewed and are negative. A complete 10 system review of systems was obtained and all systems are negative except as noted in the HPI and PMH.      Allergies  Review of patient's allergies indicates no known allergies.  Home Medications   Prior to Admission medications   Medication Sig Start Date End Date Taking? Authorizing Provider  acetaminophen (TYLENOL) 500 MG tablet Take 1,000 mg by mouth daily as needed for mild pain or moderate pain.    Historical Provider, MD  levofloxacin (LEVAQUIN) 500 MG tablet Take 1 tablet (500 mg total) by mouth daily. 03/09/15   Tanna Furry, MD  metoprolol tartrate (LOPRESSOR) 25 MG tablet Take 25 mg by mouth 2 (two) times daily.    Historical Provider, MD  oxyCODONE-acetaminophen (PERCOCET/ROXICET) 5-325 MG per tablet Take 2 tablets by mouth every 4 (  four) hours as needed. 03/09/15   Tanna Furry, MD   BP 133/76 mmHg  Pulse 75  Temp(Src) 97.9 F (36.6 C)  Resp 18  Ht 5\' 6"  (1.676 m)  Wt 165 lb (74.844 kg)  BMI 26.64 kg/m2  SpO2 100% Physical Exam  Constitutional: He is oriented to person, place, and time. He appears well-developed and well-nourished. No distress.  HENT:  Head: Normocephalic and atraumatic.  Mouth/Throat: Oropharynx is clear and moist. No oropharyngeal exudate.  Eyes: Conjunctivae and EOM are normal. Pupils are equal, round, and reactive to light.  Neck: Normal range of motion. Neck supple.  No meningismus.  Cardiovascular:  Normal rate, regular rhythm, normal heart sounds and intact distal pulses.   No murmur heard. Pulmonary/Chest: Effort normal and breath sounds normal. No respiratory distress.  Abdominal: Soft. There is no tenderness. There is no rebound and no guarding.  Musculoskeletal: Normal range of motion. He exhibits no edema or tenderness.  Neurological: He is alert and oriented to person, place, and time. No cranial nerve deficit. He exhibits normal muscle tone. Coordination normal.  Skin: Skin is warm.  Psychiatric: He has a normal mood and affect. His behavior is normal.  Nursing note and vitals reviewed.   ED Course  Procedures (including critical care time) DIAGNOSTIC STUDIES: Oxygen Saturation is100% on RA, normal by my interpretation.    COORDINATION OF CARE: 12:31 PM- Pt advised of plan for treatment and pt agrees.    Labs Review Labs Reviewed  CBG MONITORING, ED - Abnormal; Notable for the following:    Glucose-Capillary 107 (*)    All other components within normal limits    Imaging Review No results found. I have personally reviewed and evaluated these images and lab results as part of my medical decision-making.   EKG Interpretation   Date/Time:  Tuesday April 07 2015 13:17:31 EDT Ventricular Rate:  63 PR Interval:  194 QRS Duration: 91 QT Interval:  414 QTC Calculation: 424 R Axis:   54 Text Interpretation:  Sinus rhythm Baseline wander in lead(s) V2 Confirmed  by Shawntay Prest  MD, Theodosia Bahena ([id number]) on 04/07/2015 1:44:33 PM      MDM   Final diagnoses:  None    Patient presents with complaints of generalized weakness for the past several months. He states that he was diagnosed with mono while in [facility] prior to the onset of the symptoms. His workup today reveals no acute abnormality and physical examination is unremarkable. 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 He has been advised to take plenty of fluids, get plenty of rest, and follow-up  with his primary Dr. if not improving in the next week. A TSH was obtained, however that is pending at this time. If it is abnormal, the patient will be notified and treated accordingly.  I personally performed the services described in this documentation, which was scribed in my presence. The recorded information has been reviewed and is accurate.       Veryl Speak, MD 04/07/15 1346

## 2015-04-09 ENCOUNTER — Encounter (HOSPITAL_COMMUNITY): Payer: Self-pay | Admitting: Emergency Medicine

## 2015-04-09 ENCOUNTER — Emergency Department (HOSPITAL_COMMUNITY)
Admission: EM | Admit: 2015-04-09 | Discharge: 2015-04-09 | Disposition: A | Discharge status: Home / Self Care | Payer: Self-pay | Attending: Emergency Medicine | Admitting: Emergency Medicine

## 2015-04-09 DIAGNOSIS — Z859 Personal history of malignant neoplasm, unspecified: Secondary | ICD-10-CM | POA: Insufficient documentation

## 2015-04-09 DIAGNOSIS — F419 Anxiety disorder, unspecified: Secondary | ICD-10-CM | POA: Insufficient documentation

## 2015-04-09 DIAGNOSIS — Z720 Tobacco use: Secondary | ICD-10-CM | POA: Insufficient documentation

## 2015-04-09 DIAGNOSIS — Z79899 Other long term (current) drug therapy: Secondary | ICD-10-CM | POA: Insufficient documentation

## 2015-04-09 DIAGNOSIS — Z8679 Personal history of other diseases of the circulatory system: Secondary | ICD-10-CM | POA: Insufficient documentation

## 2015-04-09 DIAGNOSIS — F32A Depression, unspecified: Secondary | ICD-10-CM

## 2015-04-09 DIAGNOSIS — M1990 Unspecified osteoarthritis, unspecified site: Secondary | ICD-10-CM | POA: Insufficient documentation

## 2015-04-09 DIAGNOSIS — Z87448 Personal history of other diseases of urinary system: Secondary | ICD-10-CM | POA: Insufficient documentation

## 2015-04-09 DIAGNOSIS — F329 Major depressive disorder, single episode, unspecified: Secondary | ICD-10-CM | POA: Insufficient documentation

## 2015-04-09 DIAGNOSIS — Z7982 Long term (current) use of aspirin: Secondary | ICD-10-CM | POA: Insufficient documentation

## 2015-04-09 LAB — BASIC METABOLIC PANEL
Anion gap: 5 (ref 5–15)
BUN: 12 mg/dL (ref 6–20)
CHLORIDE: 104 mmol/L (ref 101–111)
CO2: 29 mmol/L (ref 22–32)
CREATININE: 1.12 mg/dL (ref 0.61–1.24)
Calcium: 9.5 mg/dL (ref 8.9–10.3)
GFR calc non Af Amer: >60 mL/min (ref >60)
Glucose, Bld: 101 mg/dL — ABNORMAL HIGH (ref 65–99)
POTASSIUM: 4.7 mmol/L (ref 3.5–5.1)
Sodium: 138 mmol/L (ref 135–145)

## 2015-04-09 LAB — CBC WITH DIFFERENTIAL/PLATELET
Basophils Absolute: 0 10*3/uL (ref 0.0–0.1)
Basophils Relative: 0 % (ref 0–1)
EOS ABS: 0.2 10*3/uL (ref 0.0–0.7)
Eosinophils Relative: 2 % (ref 0–5)
HEMATOCRIT: 38.2 % — AB (ref 39.0–52.0)
HEMOGLOBIN: 14.1 g/dL (ref 13.0–17.0)
LYMPHS ABS: 1.4 10*3/uL (ref 0.7–4.0)
LYMPHS PCT: 22 % (ref 12–46)
MCH: 32.3 pg (ref 26.0–34.0)
MCHC: 36.9 g/dL — AB (ref 30.0–36.0)
MCV: 87.6 fL (ref 78.0–100.0)
MONOS PCT: 10 % (ref 3–12)
Monocytes Absolute: 0.6 10*3/uL (ref 0.1–1.0)
NEUTROS ABS: 4.2 10*3/uL (ref 1.7–7.7)
NEUTROS PCT: 66 % (ref 43–77)
Platelets: 150 10*3/uL (ref 150–400)
RBC: 4.36 MIL/uL (ref 4.22–5.81)
RDW: 11.7 % (ref 11.5–15.5)
WBC: 6.3 10*3/uL (ref 4.0–10.5)

## 2015-04-09 LAB — RAPID URINE DRUG SCREEN, HOSP PERFORMED
AMPHETAMINES: NOT DETECTED
BARBITURATES: NOT DETECTED
BENZODIAZEPINES: POSITIVE — AB
Cocaine: NOT DETECTED
Opiates: POSITIVE — AB
TETRAHYDROCANNABINOL: NOT DETECTED

## 2015-04-09 LAB — ETHANOL: Alcohol, Ethyl (B): <5 mg/dL (ref <5)

## 2015-04-09 MED ORDER — LORAZEPAM 1 MG PO TABS: 1.0000 mg | ORAL_TABLET | Freq: Two times a day (BID) | ORAL | Status: DC | PRN

## 2015-04-09 NOTE — ED Provider Notes (Signed)
CSN: [id number]     Arrival date & time 04/09/15  1107 History  This chart was scribed for Nat Christen, MD by Irene Pap, ED Scribe. This patient was seen in room APA17/APA17 and patient care was started at 11:43 AM.   Chief Complaint  Patient presents with  . V70.1   The history is provided by the patient. No language interpreter was used.  HPI Comments: JONELL KRONTZ is a 49 y.o. male who presents to the Emergency Department complaining of gradually worsening anxiety onset 4 weeks ago. He states that he has had similar symptoms in the past for which he took Valium and Zoloft, prescribed by his former PCP. Pt reports sleeping a lot, increased fear of people, and SI with no active plan and no intentions to carry it out. Pt denies alcohol and street drug use. Pt states he is not employed and does not have a PCP. Denies being currently followed by a psychiatrist and was told to come in to the ED by his son.   Past Medical History  Diagnosis Date  . 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 Arthritis   . Cancer   . Migraines   . Former consumption of alcohol     quit 2013   Past Surgical History  Procedure Laterality Date  . Mandible fracture surgery  age 23  . Robotic assited partial nephrectomy Left 2012   Family History  Problem Relation Age of Onset  . Cancer Mother    Social History  Substance Use Topics  . Smoking status: Current Some Day Smoker -- 1.00 packs/day for 31 years    Types: Cigarettes  . Smokeless tobacco: Never Used  . Alcohol Use: No     Comment: used to/quit 2013    Review of Systems  Psychiatric/Behavioral: Positive for suicidal ideas. Negative for self-injury. The patient is nervous/anxious.   All other systems reviewed and are negative.  Allergies  Review of patient's allergies indicates no known allergies.  Home Medications   Prior to Admission medications   Medication Sig Start Date End Date Taking? Authorizing  Provider  aspirin 325 MG tablet Take 325 mg by mouth daily as needed for mild pain or moderate pain.   Yes Historical Provider, MD  metoprolol tartrate (LOPRESSOR) 25 MG tablet Take 25 mg by mouth 2 (two) times daily.   Yes Historical Provider, MD  Multiple Vitamins-Minerals (MEGA MULTI MEN PO) Take 2 tablets by mouth daily.   Yes Historical Provider, MD  levofloxacin (LEVAQUIN) 500 MG tablet Take 1 tablet (500 mg total) by mouth daily. Patient not taking: Reported on 04/07/2015 03/09/15   Tanna Furry, MD  LORazepam (ATIVAN) 1 MG tablet Take 1 tablet (1 mg total) by mouth 2 (two) times daily as needed for anxiety. 04/09/15   Nat Christen, MD  oxyCODONE-acetaminophen (PERCOCET/ROXICET) 5-325 MG per tablet Take 2 tablets by mouth every 4 (four) hours as needed. Patient not taking: Reported on 04/07/2015 03/09/15   Tanna Furry, MD   BP 131/66 mmHg  Temp(Src) 97.8 F (36.6 C) (Oral)  Resp 22  Ht 5\' 6"  (1.676 m)  Wt 170 lb (77.111 kg)  BMI 27.45 kg/m2  SpO2 100%  Physical Exam  Constitutional: He is oriented to person, place, and time. He appears well-developed and well-nourished.  HENT:  Head: Normocephalic and atraumatic.  Eyes: Conjunctivae and EOM are normal. Pupils are equal, round, and reactive to light.  Neck: Normal range of motion.  Neck supple.  Cardiovascular: Normal rate and regular rhythm.   Pulmonary/Chest: Effort normal and breath sounds normal.  Abdominal: Soft. Bowel sounds are normal.  Musculoskeletal: Normal range of motion.  Neurological: He is alert and oriented to person, place, and time.  Skin: Skin is warm and dry.  Psychiatric: His behavior is normal. His mood appears anxious.  Restless, lucid  Nursing note and vitals reviewed.   ED Course  Procedures (including critical care time) DIAGNOSTIC STUDIES: Oxygen Saturation is 100% on RA, normal by my interpretation.    COORDINATION OF CARE: 11:46 AM-Discussed treatment plan which includes Behavioral health consult and  Ativan with pt at bedside and pt agreed to plan.   Labs Review Labs Reviewed  BASIC METABOLIC PANEL - Abnormal; Notable for the following:    Glucose, Bld 101 (*)    All other components within normal limits  CBC WITH DIFFERENTIAL/PLATELET - Abnormal; Notable for the following:    HCT 38.2 (*)    MCHC 36.9 (*)    All other components within normal limits  URINE RAPID DRUG SCREEN, HOSP PERFORMED - Abnormal; Notable for the following:    Opiates POSITIVE (*)    Benzodiazepines POSITIVE (*)    All other components within normal limits  ETHANOL    Imaging Review No results found.    EKG Interpretation None      MDM   Final diagnoses:  Depression  Anxiety   Patient discusses history of depression and anxiety. No suicidal or homicidal ideation. Behavioral health consultation obtained. Will follow up with community mental health resources. Discharge medications Ativan 1 mg  I personally performed the services described in this documentation, which was scribed in my presence. The recorded information has been reviewed and is accurate. }   Nat Christen, MD 04/09/15 1446

## 2015-04-09 NOTE — Discharge Instructions (Signed)
Brief prescription for medication for anxiety. Follow-up with community mental health resources

## 2015-04-09 NOTE — BH Assessment (Signed)
Per Heloise Purpura, DNP - patient does not meet criteria for inpatient hospitalization due to the patient denying HI/SI/Psychosis and Substance Abuse.  Social worker will fax referral information. Writer informed the nurse Trinda Pascal) of the disposition.  Writer attempted to speak with the ER MD but he was not able to come to the phone.

## 2015-04-09 NOTE — BH Assessment (Addendum)
Tele Assessment Note   Kevin [name] is an 49 y.o. male that denies SI/HI/Psychosis.  Patient reports increased anxiety and depression.  Patient UDS is positive for opiates and benzos.  Patient denies substance abuse.  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    Axis I: Substance Induced Mood Disorder Axis II: Deferred Axis III:  Past Medical History  Diagnosis Date  . 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 Arthritis   . Cancer   . Migraines   . Former consumption of alcohol     quit 2013   Axis IV: economic problems, housing problems, occupational problems, other psychosocial or environmental problems, problems related to legal system/crime and problems related to social environment Axis V: 41-50 serious symptoms  Past Medical History:  Past Medical History  Diagnosis Date  . 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 Arthritis   . Cancer   . Migraines   . Former consumption of alcohol     quit 2013    Past Surgical History  Procedure Laterality Date  . Mandible fracture surgery  age 68  . Robotic assited partial nephrectomy Left 2012    Family History:  Family History  Problem Relation Age of Onset  . Cancer Mother     Social History:  reports that he has been smoking Cigarettes.  He has a 31 pack-year smoking history. He has never used smokeless tobacco. He reports that he does not drink alcohol or use illicit drugs.  Additional Social History:  Alcohol / Drug Use History of alcohol / drug use?: No history of alcohol / drug abuse Longest period of sobriety (when/how long): Patient reports 3 years clean but his UDS was positive opiates and benzos. Negative Consequences of Use: Financial, Legal, Personal relationships, Work / Youth worker (Patient reports 6 DWI in the past.) Withdrawal Symptoms:  (None Reported)  CIWA: CIWA-Ar BP: 131/66 mmHg COWS:    PATIENT STRENGTHS: (choose at least  two) Ability for insight Average or above average intelligence Capable of independent living Communication skills  Allergies: No Known Allergies  Home Medications:  (Not in a hospital admission)  OB/GYN Status:  No LMP for male patient.  General Assessment Data Location of Assessment: [facility] TTS Assessment: In system Is this a Tele or Face-to-Face Assessment?: Tele Assessment Is this an Initial Assessment or a Re-assessment for this encounter?: Initial Assessment Marital status: Single Maiden name: na Is patient pregnant?: No Pregnancy Status: No Living Arrangements: Parent (Father, Brother and Adult Children) Can pt return to current living arrangement?: Yes Admission Status: Voluntary Is patient capable of signing voluntary admission?: Yes Referral Source: Self/Family/Friend Insurance type: Self Pay  Medical Screening Exam ([place]) Medical Exam completed: Yes  Crisis Care Plan Living Arrangements: Parent (Father, Brother and Adult Children) Name of Psychiatrist: None Reported Name of Therapist: None Reported  Education Status Is patient currently in school?: No Current Grade: NA Highest grade of school patient has completed: NA Name of school: NA Contact person: NA  Risk to self with the past 6 months Suicidal Ideation: No Has patient been a risk to self within the past 6 months prior to admission? : No Suicidal Intent: No Has patient had any suicidal intent within the past 6 months prior to admission? : No Is patient at risk for suicide?: No Suicidal Plan?: No Has patient had any suicidal plan within the past  6 months prior to admission? : No Access to Means: No What has been your use of drugs/alcohol within the last 12 months?: None Reported (UDS positive for opiates and benzos) Previous Attempts/Gestures: No How many times?: 0 Other Self Harm Risks: None Reported Triggers for Past Attempts:  (NA) Intentional Self Injurious Behavior: None Family  Suicide History: No Recent stressful life event(s): Job Loss, Financial Problems Persecutory voices/beliefs?: No Depression: Yes Depression Symptoms: Despondent, Feeling worthless/self pity Substance abuse history and/or treatment for substance abuse?: Yes Suicide prevention information given to non-admitted patients: Not applicable  Risk to Others within the past 6 months Homicidal Ideation: No Does patient have any lifetime risk of violence toward others beyond the six months prior to admission? : No Thoughts of Harm to Others: No Current Homicidal Intent: No Current Homicidal Plan: No Access to Homicidal Means: No Identified Victim: NA History of harm to others?: No Assessment of Violence: None Noted Violent Behavior Description: NA Does patient have access to weapons?: No Criminal Charges Pending?: No Does patient have a court date: No Is patient on probation?: No  Psychosis Hallucinations: None noted Delusions: None noted  Mental Status Report Appearance/Hygiene: Disheveled Eye Contact: Good Motor Activity: Freedom of movement Speech: Logical/coherent Level of Consciousness: Alert Mood: Depressed Affect: Anxious Anxiety Level: Minimal Thought Processes: Coherent, Relevant Judgement: Unimpaired Orientation: Place, Person, Time, Situation Obsessive Compulsive Thoughts/Behaviors: None  Cognitive Functioning Concentration: Decreased Memory: Remote Intact, Recent Intact IQ: Average Insight: Fair Impulse Control: Fair Appetite: Fair Weight Loss: 0 Weight Gain: 0 Sleep: No Change Total Hours of Sleep: 8 Vegetative Symptoms: None  ADLScreening [facility] Assessment Services) Patient's cognitive ability adequate to safely complete daily activities?: Yes Patient able to express need for assistance with ADLs?: Yes Independently performs ADLs?: Yes (appropriate for developmental age)  Prior Inpatient Therapy Prior Inpatient Therapy: No Prior Therapy Dates: NA Prior  Therapy Facilty/Provider(s): NA Reason for Treatment: NA  Prior Outpatient Therapy Prior Outpatient Therapy: No Prior Therapy Dates: NA Prior Therapy Facilty/Provider(s): NA Reason for Treatment: NA Does patient have an ACCT team?: No Does patient have Intensive In-House Services?  : No Does patient have [facility] services? : No Does patient have P4CC services?: No  ADL Screening (condition at time of admission) Patient's cognitive ability adequate to safely complete daily activities?: Yes Is the patient deaf or have difficulty hearing?: No Does the patient have difficulty seeing, even when wearing glasses/contacts?: No Does the patient have difficulty concentrating, remembering, or making decisions?: No Patient able to express need for assistance with ADLs?: Yes Does the patient have difficulty dressing or bathing?: No Independently performs ADLs?: Yes (appropriate for developmental age) Does the patient have difficulty walking or climbing stairs?: No Weakness of Legs: None Weakness of Arms/Hands: None  Home Assistive Devices/Equipment Home Assistive Devices/Equipment: None    Abuse/Neglect Assessment (Assessment to be complete while patient is alone) Physical Abuse: Denies Verbal Abuse: Denies Sexual Abuse: Denies Exploitation of patient/patient's resources: Denies Self-Neglect: Denies Values / Beliefs Cultural Requests During Hospitalization: None Spiritual Requests During Hospitalization: None Consults Spiritual Care Consult Needed: No Social Work Consult Needed: No Regulatory affairs officer (For Healthcare) Does patient have an advance directive?: No Would patient like information on creating an advanced directive?: No - patient declined information    Additional Information 1:1 In Past 12 Months?: No CIRT Risk: No Elopement Risk: No Does patient have medical clearance?: Yes     Disposition:  Disposition Initial Assessment Completed for this Encounter:  Yes Disposition of Patient: Other dispositions  Other disposition(s): Other (Comment)  Graciella Freer [place] 04/09/2015 1:36 PM

## 2015-04-09 NOTE — ED Notes (Signed)
Pt c/o having extreme anxiety and needing help. Pt has visible tremors. Denies nausea, vomiting, pain. Pt said he has been off his anti-anxiety medications for 3 years now because he can't afford them after he lost his medical insurance. Pt denies suicidal and homicidal ideation. Pt very cooperative, but anxious.

## 2015-04-09 NOTE — ED Notes (Signed)
Patient with no complaints at this time. Respirations even and unlabored. Skin warm/dry. Discharge instructions reviewed with patient at this time. Patient given opportunity to voice concerns/ask questions. Patient discharged at this time and left Emergency Department with steady gait.   

## 2015-04-09 NOTE — Progress Notes (Signed)
Requested by TTS to provide outpatient mental health resources for pt as he reports no current provider.  Provided information for [facility] and [facility], both in [place], [place]. Information can be found in pt's d/c instructions.  Sharren Bridge, MSW, LCSW Clinical Social Work, Disposition  04/09/2015 [phone]

## 2015-04-09 NOTE — ED Notes (Signed)
Pt reports extreme anxiety than began several weeks ago. Pt states he "thinks he needs to be committed somewhere".

## 2015-04-24 ENCOUNTER — Encounter (HOSPITAL_COMMUNITY): Payer: Self-pay | Admitting: Emergency Medicine

## 2015-04-24 ENCOUNTER — Emergency Department (HOSPITAL_COMMUNITY)
Admission: EM | Admit: 2015-04-24 | Discharge: 2015-04-24 | Disposition: A | Discharge status: Home / Self Care | Payer: Self-pay | Attending: Emergency Medicine | Admitting: Emergency Medicine

## 2015-04-24 DIAGNOSIS — F419 Anxiety disorder, unspecified: Secondary | ICD-10-CM | POA: Insufficient documentation

## 2015-04-24 DIAGNOSIS — Z720 Tobacco use: Secondary | ICD-10-CM | POA: Insufficient documentation

## 2015-04-24 DIAGNOSIS — Z8669 Personal history of other diseases of the nervous system and sense organs: Secondary | ICD-10-CM | POA: Insufficient documentation

## 2015-04-24 DIAGNOSIS — Z7982 Long term (current) use of aspirin: Secondary | ICD-10-CM | POA: Insufficient documentation

## 2015-04-24 DIAGNOSIS — Z859 Personal history of malignant neoplasm, unspecified: Secondary | ICD-10-CM | POA: Insufficient documentation

## 2015-04-24 DIAGNOSIS — Z79899 Other long term (current) drug therapy: Secondary | ICD-10-CM | POA: Insufficient documentation

## 2015-04-24 DIAGNOSIS — Z87448 Personal history of other diseases of urinary system: Secondary | ICD-10-CM | POA: Insufficient documentation

## 2015-04-24 DIAGNOSIS — M1990 Unspecified osteoarthritis, unspecified site: Secondary | ICD-10-CM | POA: Insufficient documentation

## 2015-04-24 MED ORDER — CHLORDIAZEPOXIDE HCL 25 MG PO CAPS: ORAL_CAPSULE | ORAL | Status: DC

## 2015-04-24 NOTE — ED Provider Notes (Signed)
CSN: [id number]     Arrival date & time 04/24/15  2021 History  This chart was scribed for Kevin Etienne, DO by Irene Pap, ED Scribe. This patient was seen in room APA03/APA03 and patient care was started at 8:53 PM.    Chief Complaint  Patient presents with  . Anxiety   Patient is a 49 y.o. male presenting with anxiety. The history is provided by the patient. No language interpreter was used.  Anxiety This is a recurrent problem. The problem occurs constantly. The problem has been gradually worsening. Pertinent negatives include no chest pain, no abdominal pain and no shortness of breath. Treatments tried: suboxone. The treatment provided significant relief.   HPI Comments: Kevin [name] is a 49 y.o. male who presents to the Emergency Department complaining of gradually worsening anxiety. Pt states that he was in [facility] one month ago to a clinic that tried to offer him methadone, but he took Suboxone instead. He states that he thought he improved, but states that symptoms have worsened. He reports that he does not want to feel "crazy" like this anymore,but has run out of Suboxone and has not been able to afford a new prescription. He states that he has no idea what to do, because his available resources are either out of his price range or he is unable to pay for them. He states that he is not drug seeking, although he states that he has mild opioid withdrawal; states that Ativan has worked for him in the past when he comes to the ED. He denies fever, chills, chest pain, SOB, nausea, vomiting, or abdominal pain.   Past Medical History  Diagnosis Date  . 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 Arthritis   . Cancer   . Migraines   . Former consumption of alcohol     quit 2013   Past Surgical History  Procedure Laterality Date  . Mandible fracture surgery  age 56  . Robotic assited partial nephrectomy Left 2012   Family History  Problem Relation Age of  Onset  . Cancer Mother    Social History  Substance Use Topics  . Smoking status: Current Some Day Smoker -- 1.00 packs/day for 31 years    Types: Cigarettes  . Smokeless tobacco: Never Used  . Alcohol Use: No     Comment: used to/quit 2013    Review of Systems  Constitutional: Negative for fever and chills.  Respiratory: Negative for shortness of breath.   Cardiovascular: Negative for chest pain.  Gastrointestinal: Negative for nausea, vomiting and abdominal pain.  Psychiatric/Behavioral: The patient is nervous/anxious.    Allergies  Review of patient's allergies indicates no known allergies.  Home Medications   Prior to Admission medications   Medication Sig Start Date End Date Taking? Authorizing Provider  aspirin 325 MG tablet Take 325 mg by mouth daily as needed for mild pain or moderate pain.   Yes Historical Provider, MD  metoprolol tartrate (LOPRESSOR) 25 MG tablet Take 25 mg by mouth 2 (two) times daily.   Yes Historical Provider, MD  Multiple Vitamins-Minerals (MEGA MULTI MEN PO) Take 2 tablets by mouth daily.   Yes Historical Provider, MD  chlordiazePOXIDE (LIBRIUM) 25 MG capsule 50mg  PO TID x 1D, then 25-50mg  PO BID X 1D, then 25-50mg  PO QD X 1D 04/24/15   Kevin Etienne, DO  LORazepam (ATIVAN) 1 MG tablet Take 1 tablet (1 mg total) by mouth 2 (two) times  daily as needed for anxiety. Patient not taking: Reported on 04/24/2015 04/09/15   Nat Christen, MD   BP 110/61 mmHg  Pulse 86  Temp(Src) 97.5 F (36.4 C) (Oral)  Resp 20  Ht 5\' 6"  (1.676 m)  Wt 175 lb (79.379 kg)  BMI 28.26 kg/m2  SpO2 100%  Physical Exam  Constitutional: He is oriented to person, place, and time. He appears well-developed and well-nourished.  HENT:  Head: Normocephalic and atraumatic.  Eyes: Conjunctivae and EOM are normal. Pupils are equal, round, and reactive to light.  Neck: Normal range of motion. Neck supple.  Cardiovascular: Normal rate, regular rhythm and normal heart sounds.    Pulmonary/Chest: Effort normal and breath sounds normal.  Abdominal: Soft. There is no tenderness.  Musculoskeletal: Normal range of motion.  Neurological: He is alert and oriented to person, place, and time.  Skin: Skin is warm and dry.  Psychiatric: He has a normal mood and affect. His behavior is normal.  Nursing note and vitals reviewed.   ED Course  Procedures (including critical care time) DIAGNOSTIC STUDIES: Oxygen Saturation is 100% on RA, normal by my interpretation.    COORDINATION OF CARE: 8:58 PM-Discussed treatment plan which includes discussion with case manager and resources with pt at bedside and pt agreed to plan.   Labs Review Labs Reviewed - No data to display  Imaging Review No results found.    EKG Interpretation None      MDM   Final diagnoses:  Anxiety    49 yo M with a chief complaint of anxiety. Patient's main anxiety ascites been unable to acquire Suboxone in the area. Visit it costs too much. Michela Pitcher he is able to get it without difficulty in [facility]. Patient states that he just moved here 2 months ago from there. However patient has had multiple visits in this ED over the past couple years. Patient not showing any signs or symptoms of withdrawal. Patient not suicidal or homicidal. Given resource guide to the area. Given Librium taper.  I personally performed the services described in this documentation, which was scribed in my presence. The recorded information has been reviewed and is accurate.  11:50 PM:  I have discussed the diagnosis/risks/treatment options with the patient and believe the pt to be eligible for discharge home to follow-up with PCP. We also discussed returning to the ED immediately if new or worsening sx occur. We discussed the sx which are most concerning (e.g., SI/HI) that necessitate immediate return. Medications administered to the patient during their visit and any new prescriptions provided to the patient are listed  below.  Medications given during this visit Medications - No data to display  Discharge Medication List as of 04/24/2015  9:09 PM       The patient appears reasonably screen and/or stabilized for discharge and I doubt any other medical condition or other [facility] requiring further screening, evaluation, or treatment in the ED at this time prior to discharge.      Kevin Etienne, DO 04/24/15 2350

## 2015-04-24 NOTE — ED Notes (Signed)
Pt states understanding of care given and follow up instructions.  Unhappy with care plan.  Explained we do not have the capabilities to prescribe or give suboxone

## 2015-04-24 NOTE — ED Notes (Signed)
Pt very anxious and irritated that he is unable to find someone to prescribe suboxone.

## 2015-04-24 NOTE — ED Notes (Signed)
Pt was on suboxone, lived in [place], he has moved back here and can not find a clinic, has been out of medication. He did wonderful and does not want to go back on pain pills, wanted to come her to get help and not get pain pills.

## 2015-04-24 NOTE — Discharge Instructions (Signed)
RESOURCE GUIDE      Behavioral Health Resources in the Community  Intensive Outpatient Programs: [facility]      [address] [place], [place] [phone] Both a day and evening program       [facility]     [address]        [place], [place] [address] [phone]         ADS: [facility] [address] [place] [phone]  [facility] ACCESS LINE: 1-800-853-5163 or [phone] [address] [place], [place] [address] Http://www.[email or web address]/adult.htm   Substance Abuse Resources: - [facility]  [phone] - [facility] [phone] - [facility] [phone] - [facility] [phone] - Residential & Outpatient Substance Abuse Program  [phone]  Psychological Services: -  Health  [phone] - [facility]  [phone] - [facility], [address], [place], ACCESS LINE: 1-800-853-5163 or 336-641-4981, Http://www.[facility].[email or web address]/services/adult.htm  Mobile Crisis Teams:                                        [facility]         Mobile Crisis Care Unit 1-877-626-1772             [facility] [address] [place] [phone]                                         Interventionist [name] DeEsch [address] [place] [phone]  Self-Help/Support Groups: [facility] [place] Variety of support groups [phone] (call for more info)  Narcotics Anonymous (NA) [facility] [address] [place] - 2 meetings at this location  Residential Treatment Programs:  [facility]      [address]        [place]       [phone]         [facility] [address] [place], [place]  [address] [phone]  [facility]  [address] [place], [place] [address] [phone] Admissions: 8am-3pm M-F  [facility]     [address]        [place], [place] [address]       [phone]         [facility] [address] [place], [place] [phone]  [facility] [address] [place], [place] [phone]  [facility] - Intensive Outpatient      [address]     [place], [place]       [phone]         [facility]     [address] [place], [place] [phone] or [phone]  [facility] ([facility]), Medicaid [address] [place], [place] [phone]  [facility]                                               [address]   [phone]  [facility] Resources: [facility]- [phone]               General Therapy                                                Julie Brannon, PhD        [address]                                       [place], [place] [address]         [phone]   Insurance  [facility]   [address] [place], [place] [address] [phone]  [facility] [address] [place], [place] [address] [phone] Insurance/Medicaid/sponsorship through [facility]  [facility]                                              [address]                                        [place], [place] [address]    Therapy/tele-psych/case         [phone]          [facility] [address]   [place], [place]  [address]  Adolescent/group home/case management [phone]                                           Julia Brannon PhD       General therapy       Insurance   [phone]         Dr. Arfeen, Insurance, M-F [phone]  [facility]  [employer] [facility] [address]                 [address]          [address]  [place]                                               [place]                              [place] Phone:  [phone]                                  Phone:  [phone]                   Phone:  [phone]  [facility], [phone] - [facility] - [facility]- [phone]       -     [facility] in , [address],               [phone], Insurance  

## 2015-07-08 ENCOUNTER — Encounter (HOSPITAL_COMMUNITY): Payer: Self-pay

## 2015-07-08 ENCOUNTER — Emergency Department (HOSPITAL_COMMUNITY)
Admission: EM | Admit: 2015-07-08 | Discharge: 2015-07-08 | Disposition: A | Discharge status: Home / Self Care | Payer: Self-pay | Attending: Emergency Medicine | Admitting: Emergency Medicine

## 2015-07-08 DIAGNOSIS — Z7982 Long term (current) use of aspirin: Secondary | ICD-10-CM | POA: Insufficient documentation

## 2015-07-08 DIAGNOSIS — M1990 Unspecified osteoarthritis, unspecified site: Secondary | ICD-10-CM | POA: Insufficient documentation

## 2015-07-08 DIAGNOSIS — Z85528 Personal history of other malignant neoplasm of kidney: Secondary | ICD-10-CM | POA: Insufficient documentation

## 2015-07-08 DIAGNOSIS — G43909 Migraine, unspecified, not intractable, without status migrainosus: Secondary | ICD-10-CM | POA: Insufficient documentation

## 2015-07-08 DIAGNOSIS — Z8781 Personal history of (healed) traumatic fracture: Secondary | ICD-10-CM | POA: Insufficient documentation

## 2015-07-08 DIAGNOSIS — F17210 Nicotine dependence, cigarettes, uncomplicated: Secondary | ICD-10-CM | POA: Insufficient documentation

## 2015-07-08 DIAGNOSIS — K029 Dental caries, unspecified: Secondary | ICD-10-CM | POA: Insufficient documentation

## 2015-07-08 DIAGNOSIS — Z79899 Other long term (current) drug therapy: Secondary | ICD-10-CM | POA: Insufficient documentation

## 2015-07-08 MED ORDER — AMOXICILLIN 500 MG PO CAPS: 500.0000 mg | ORAL_CAPSULE | Freq: Three times a day (TID) | ORAL | Status: DC

## 2015-07-08 MED ORDER — TRAMADOL HCL 50 MG PO TABS: ORAL_TABLET | ORAL | Status: DC

## 2015-07-08 NOTE — Discharge Instructions (Signed)
Please see Dr. Nyoka Cowden, with the dentists of your choice as sone as possible. Please use medications as suggested. Please use ibuprofen every 6 hours, use Ultram for more severe pain. Ultram may cause drowsiness, please use this medication with caution, please take both Amoxil, Ultram, and ibuprofen with food. Dental Caries Dental caries is tooth decay. This decay can cause a hole in teeth (cavity) that can get bigger and deeper over time. HOME CARE  Brush and floss your teeth. Do this at least two times a day.  Use a fluoride toothpaste.  Use a mouth rinse if told by your dentist or doctor.  Eat less sugary and starchy foods. Drink less sugary drinks.  Avoid snacking often on sugary and starchy foods. Avoid sipping often on sugary drinks.  Keep regular checkups and cleanings with your dentist.  Use fluoride supplements if told by your dentist or doctor.  Allow fluoride to be applied to teeth if told by your dentist or doctor.   This information is not intended to replace advice given to you by your health care provider. Make sure you discuss any questions you have with your health care provider.   Document Released: 04/26/2008 Document Revised: 08/08/2014 Document Reviewed: 07/20/2012 Elsevier Interactive Patient Education [employer].

## 2015-07-08 NOTE — ED Notes (Signed)
Pt reports pain from broken tooth x 1week.

## 2015-07-08 NOTE — ED Provider Notes (Signed)
CSN: [id number]     Arrival date & time 07/08/15  1204 History   First MD Initiated Contact with Patient 07/08/15 1257     Chief Complaint  Patient presents with  . Dental Pain     (Consider location/radiation/quality/duration/timing/severity/associated sxs/prior Treatment) Patient is a 49 y.o. male presenting with tooth pain. The history is provided by the patient.  Dental Pain Location:  Lower Lower teeth location:  30/RL 1st molar Quality:  Throbbing Severity:  Moderate Onset quality:  Gradual Duration:  1 week Timing:  Intermittent Progression:  Worsening Context: dental caries   Relieved by:  Nothing Worsened by:  Cold food/drink Ineffective treatments:  Topical anesthetic gel and NSAIDs Associated symptoms: gum swelling and headaches   Associated symptoms: no difficulty swallowing and no trismus   Risk factors: lack of dental care and smoking     Past Medical History  Diagnosis Date  . 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 Arthritis   . Cancer ([place])   . Migraines   . Former consumption of alcohol     quit 2013   Past Surgical History  Procedure Laterality Date  . Mandible fracture surgery  age 11  . Robotic assited partial nephrectomy Left 2012   Family History  Problem Relation Age of Onset  . Cancer Mother    Social History  Substance Use Topics  . Smoking status: Current Some Day Smoker -- 1.00 packs/day for 31 years    Types: Cigarettes  . Smokeless tobacco: Never Used  . Alcohol Use: No     Comment: used to/quit 2013    Review of Systems  HENT: Positive for dental problem.   Musculoskeletal: Positive for arthralgias.  Neurological: Positive for headaches.  All other systems reviewed and are negative.     Allergies  Review of patient's allergies indicates no known allergies.  Home Medications   Prior to Admission medications   Medication Sig Start Date End Date Taking? Authorizing Provider  aspirin 325 MG  tablet Take 325 mg by mouth daily as needed for mild pain or moderate pain.    Historical Provider, MD  chlordiazePOXIDE (LIBRIUM) 25 MG capsule 50mg  PO TID x 1D, then 25-50mg  PO BID X 1D, then 25-50mg  PO QD X 1D 04/24/15   Deno Etienne, DO  LORazepam (ATIVAN) 1 MG tablet Take 1 tablet (1 mg total) by mouth 2 (two) times daily as needed for anxiety. Patient not taking: Reported on 04/24/2015 04/09/15   Nat Christen, MD  metoprolol tartrate (LOPRESSOR) 25 MG tablet Take 25 mg by mouth 2 (two) times daily.    Historical Provider, MD  Multiple Vitamins-Minerals (MEGA MULTI MEN PO) Take 2 tablets by mouth daily.    Historical Provider, MD   BP 136/85 mmHg  Pulse 105  Temp(Src) 97.4 F (36.3 C) (Oral)  Resp 18  Ht 5\' 6"  (1.676 m)  Wt 77.111 kg  BMI 27.45 kg/m2  SpO2 99% Physical Exam  Constitutional: He is oriented to person, place, and time. He appears well-developed and well-nourished.  Non-toxic appearance.  HENT:  Head: Normocephalic.  Right Ear: Tympanic membrane and external ear normal.  Left Ear: Tympanic membrane and external ear normal.  Deep cavity of the right lower molar. No abscess. No swelling under the tongue. No trismus.  Eyes: EOM and lids are normal. Pupils are equal, round, and reactive to light.  Neck: Normal range of motion. Neck supple. Carotid bruit is not present.  Cardiovascular: Normal  rate, regular rhythm, normal heart sounds, intact distal pulses and normal pulses.   Pulmonary/Chest: Breath sounds normal. No respiratory distress.  Abdominal: Soft. Bowel sounds are normal. There is no tenderness. There is no guarding.  Musculoskeletal: Normal range of motion.  Lymphadenopathy:       Head (right side): No submandibular adenopathy present.       Head (left side): No submandibular adenopathy present.    He has no cervical adenopathy.  Neurological: He is alert and oriented to person, place, and time. He has normal strength. No cranial nerve deficit or sensory deficit.   Skin: Skin is warm and dry.  Psychiatric: He has a normal mood and affect. His speech is normal.  Nursing note and vitals reviewed.   ED Course  Procedures (including critical care time) Labs Review Labs Reviewed - No data to display  Imaging Review No results found. I have personally reviewed and evaluated these images and lab results as part of my medical decision-making.   EKG Interpretation None      MDM  Vital signs reviewed. The patient has a deep dental cavity of the right lower molar. Is no trismus, no visible abscess, and no evidence for Ludwig's Angina. Patient is given information on dental resources. He is also given prescription for Amoxil and Ultram.    Final diagnoses:  Dental caries    **I have reviewed nursing notes, vital signs, and all appropriate lab and imaging results for this patient.Lily Kocher, PA-C 07/08/15 2115  Davonna Belling, MD 07/11/15 0000

## 2015-08-02 HISTORY — PX: TOTAL HIP ARTHROPLASTY: SHX124

## 2015-10-08 ENCOUNTER — Encounter (HOSPITAL_COMMUNITY): Payer: Self-pay

## 2015-10-08 ENCOUNTER — Emergency Department (HOSPITAL_COMMUNITY)
Admission: EM | Admit: 2015-10-08 | Discharge: 2015-10-08 | Disposition: A | Discharge status: Home / Self Care | Payer: Self-pay | Attending: Emergency Medicine | Admitting: Emergency Medicine

## 2015-10-08 DIAGNOSIS — M1990 Unspecified osteoarthritis, unspecified site: Secondary | ICD-10-CM | POA: Insufficient documentation

## 2015-10-08 DIAGNOSIS — R5383 Other fatigue: Secondary | ICD-10-CM | POA: Insufficient documentation

## 2015-10-08 DIAGNOSIS — F17210 Nicotine dependence, cigarettes, uncomplicated: Secondary | ICD-10-CM | POA: Insufficient documentation

## 2015-10-08 DIAGNOSIS — Z7982 Long term (current) use of aspirin: Secondary | ICD-10-CM | POA: Insufficient documentation

## 2015-10-08 LAB — MONONUCLEOSIS SCREEN: Mono Screen: NEGATIVE

## 2015-10-08 LAB — CBC WITH DIFFERENTIAL/PLATELET
BASOS PCT: 0 %
Basophils Absolute: 0 10*3/uL (ref 0.0–0.1)
EOS ABS: 0.2 10*3/uL (ref 0.0–0.7)
Eosinophils Relative: 3 %
HEMATOCRIT: 37.4 % — AB (ref 39.0–52.0)
HEMOGLOBIN: 13.4 g/dL (ref 13.0–17.0)
LYMPHS ABS: 1.8 10*3/uL (ref 0.7–4.0)
Lymphocytes Relative: 29 %
MCH: 32.5 pg (ref 26.0–34.0)
MCHC: 35.8 g/dL (ref 30.0–36.0)
MCV: 90.8 fL (ref 78.0–100.0)
MONOS PCT: 5 %
Monocytes Absolute: 0.3 10*3/uL (ref 0.1–1.0)
NEUTROS ABS: 3.9 10*3/uL (ref 1.7–7.7)
NEUTROS PCT: 63 %
Platelets: 137 10*3/uL — ABNORMAL LOW (ref 150–400)
RBC: 4.12 MIL/uL — AB (ref 4.22–5.81)
RDW: 12.6 % (ref 11.5–15.5)
WBC: 6.1 10*3/uL (ref 4.0–10.5)

## 2015-10-08 LAB — RAPID STREP SCREEN (MED CTR MEBANE ONLY): Streptococcus, Group A Screen (Direct): NEGATIVE

## 2015-10-08 LAB — BASIC METABOLIC PANEL
ANION GAP: 5 (ref 5–15)
BUN: 12 mg/dL (ref 6–20)
CHLORIDE: 110 mmol/L (ref 101–111)
CO2: 27 mmol/L (ref 22–32)
CREATININE: 0.98 mg/dL (ref 0.61–1.24)
Calcium: 8.9 mg/dL (ref 8.9–10.3)
GFR calc non Af Amer: >60 mL/min (ref >60)
Glucose, Bld: 102 mg/dL — ABNORMAL HIGH (ref 65–99)
Potassium: 3.7 mmol/L (ref 3.5–5.1)
SODIUM: 142 mmol/L (ref 135–145)

## 2015-10-08 NOTE — ED Provider Notes (Signed)
CSN: [id number]     Arrival date & time 10/08/15  [id number] History   First MD Initiated Contact with Patient 10/08/15 [phone]     Chief Complaint  Patient presents with  . Generalized Body Aches      HPI Pt was seen at [facility]. Per pt, c/o gradual onset and persistence of constant generalized body aches and fatigue for the past 2 months. Pt states he "saw white spots on my throat" 2 months ago and "took some old antibiotic prescription I had." Pt states his symptoms have not changed over the past 2 months. Pt denies any other symptoms. Pt has not called his PMD for evaluation. Denies CP/SOB, no cough, no abd pain, no N/V/D, no fevers, no sore throat, no rash, no focal motor weakness.   Past Medical History  Diagnosis Date  . 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 Arthritis   . Cancer ([place])   . Migraines   . Former consumption of alcohol     quit 2013   Past Surgical History  Procedure Laterality Date  . Mandible fracture surgery  age 86  . Robotic assited partial nephrectomy Left 2012   Family History  Problem Relation Age of Onset  . Cancer Mother    Social History  Substance Use Topics  . Smoking status: Current Some Day Smoker -- 1.00 packs/day for 31 years    Types: Cigarettes  . Smokeless tobacco: Never Used  . Alcohol Use: No     Comment: used to/quit 2013    Review of Systems ROS: Statement: All systems negative except as marked or noted in the HPI; Constitutional: Negative for fever and chills. +fatigue. ; ; Eyes: Negative for eye pain, redness and discharge. ; ; ENMT: Negative for ear pain, hoarseness, nasal congestion, sinus pressure and sore throat. ; ; Cardiovascular: Negative for chest pain, palpitations, diaphoresis, dyspnea and peripheral edema. ; ; Respiratory: Negative for cough, wheezing and stridor. ; ; Gastrointestinal: Negative for nausea, vomiting, diarrhea, abdominal pain, blood in stool, hematemesis, jaundice and rectal bleeding. . ;  ; Genitourinary: Negative for dysuria, flank pain and hematuria. ; ; Musculoskeletal: Negative for back pain and neck pain. Negative for swelling and trauma.; ; Skin: Negative for pruritus, rash, abrasions, blisters, bruising and skin lesion.; ; Neuro: Negative for headache, lightheadedness and neck stiffness. Negative for weakness, altered level of consciousness , altered mental status, extremity weakness, paresthesias, involuntary movement, seizure and syncope.      Allergies  Review of patient's allergies indicates no known allergies.  Home Medications   Prior to Admission medications   Medication Sig Start Date End Date Taking? Authorizing Provider  amoxicillin (AMOXIL) 500 MG capsule Take 1 capsule (500 mg total) by mouth 3 (three) times daily. 07/08/15   Lily Kocher, PA-C  aspirin 325 MG tablet Take 325 mg by mouth daily as needed for mild pain or moderate pain.    Historical Provider, MD  chlordiazePOXIDE (LIBRIUM) 25 MG capsule 50mg  PO TID x 1D, then 25-50mg  PO BID X 1D, then 25-50mg  PO QD X 1D 04/24/15   Deno Etienne, DO  LORazepam (ATIVAN) 1 MG tablet Take 1 tablet (1 mg total) by mouth 2 (two) times daily as needed for anxiety. Patient not taking: Reported on 04/24/2015 04/09/15   Nat Christen, MD  metoprolol tartrate (LOPRESSOR) 25 MG tablet Take 25 mg by mouth 2 (two) times daily.    Historical Provider, MD  Multiple Vitamins-Minerals (MEGA MULTI MEN  PO) Take 2 tablets by mouth daily.    Historical Provider, MD  traMADol (ULTRAM) 50 MG tablet 1 or 2 po q6h 07/08/15   Lily Kocher, PA-C   BP 138/95 mmHg  Pulse 93  Temp(Src) 97.4 F (36.3 C) (Oral)  Resp 18  Ht 5\' 6"  (1.676 m)  Wt 165 lb (74.844 kg)  BMI 26.64 kg/m2  SpO2 100% Physical Exam  0720: Physical examination:  Nursing notes reviewed; Vital signs and O2 SAT reviewed;  Constitutional: Well developed, Well nourished, Well hydrated, In no acute distress; Head:  Normocephalic, atraumatic; Eyes: EOMI, PERRL, No scleral icterus;  ENMT: TM's clear bilat. +edemetous nasal turbinates bilat with clear rhinorrhea. Mouth and pharynx without lesions. No tonsillar exudates. No intra-oral edema. No submandibular or sublingual edema. No hoarse voice, no drooling, no stridor. No pain with manipulation of larynx. No trismus. Mouth and pharynx normal, Mucous membranes moist; Neck: Supple, Full range of motion, No lymphadenopathy; Cardiovascular: Regular rate and rhythm, No gallop; Respiratory: Breath sounds clear & equal bilaterally, No wheezes.  Speaking full sentences with ease, Normal respiratory effort/excursion; Chest: Nontender, Movement normal; Abdomen: Soft, Nontender, Nondistended, Normal bowel sounds; Genitourinary: No CVA tenderness; Extremities: Pulses normal, No tenderness, No edema, No calf edema or asymmetry.; Neuro: AA&Ox3, Major CN grossly intact.  Speech clear. No gross focal motor or sensory deficits in extremities. Climbs on and off stretcher easily by himself. Gait steady.; Skin: Color normal, Warm, Dry.   ED Course  Procedures (including critical care time) Labs Review  Imaging Review  I have personally reviewed and evaluated these images and lab results as part of my medical decision-making.   EKG Interpretation None      MDM  MDM Reviewed: previous chart, nursing note and vitals Reviewed previous: labs Interpretation: labs      Results for orders placed or performed during the hospital encounter of 10/08/15  Rapid strep screen  Result Value Ref Range   Streptococcus, Group A Screen (Direct) NEGATIVE NEGATIVE  Mononucleosis screen  Result Value Ref Range   Mono Screen NEGATIVE NEGATIVE  Basic metabolic panel  Result Value Ref Range   Sodium 142 135 - 145 mmol/L   Potassium 3.7 3.5 - 5.1 mmol/L   Chloride 110 101 - 111 mmol/L   CO2 27 22 - 32 mmol/L   Glucose, Bld 102 (H) 65 - 99 mg/dL   BUN 12 6 - 20 mg/dL   Creatinine, Ser 0.98 0.61 - 1.24 mg/dL   Calcium 8.9 8.9 - 10.3 mg/dL   GFR calc  non Af Amer >60 >60 mL/min   GFR calc Af Amer >60 >60 mL/min   Anion gap 5 5 - 15  CBC with Differential  Result Value Ref Range   WBC 6.1 4.0 - 10.5 K/uL   RBC 4.12 (L) 4.22 - 5.81 MIL/uL   Hemoglobin 13.4 13.0 - 17.0 g/dL   HCT 37.4 (L) 39.0 - 52.0 %   MCV 90.8 78.0 - 100.0 fL   MCH 32.5 26.0 - 34.0 pg   MCHC 35.8 30.0 - 36.0 g/dL   RDW 12.6 11.5 - 15.5 %   Platelets 137 (L) 150 - 400 K/uL   Neutrophils Relative % 63 %   Neutro Abs 3.9 1.7 - 7.7 K/uL   Lymphocytes Relative 29 %   Lymphs Abs 1.8 0.7 - 4.0 K/uL   Monocytes Relative 5 %   Monocytes Absolute 0.3 0.1 - 1.0 K/uL   Eosinophils Relative 3 %   Eosinophils Absolute 0.2  0.0 - 0.7 K/uL   Basophils Relative 0 %   Basophils Absolute 0.0 0.0 - 0.1 K/uL    0825:  Labs reassuring. VSS. Pt strongly encouraged to f/u with his PMD for good continuity of care and continued investigation for his complaint; pt verb understanding. Dx and testing d/w pt.  Questions answered.  Verb understanding, agreeable to d/c home with outpt f/u.    Francine Graven, DO 10/12/15 1623

## 2015-10-08 NOTE — ED Notes (Signed)
PT stated he was leaving at this time and didn't need his d/c papers.

## 2015-10-08 NOTE — Discharge Instructions (Signed)
Take your usual prescriptions as previously directed.  Call your regular medical doctor today to schedule a follow up appointment within the next 3 days to continue further testing for your complaint.  Return to the Emergency Department immediately sooner if worsening.

## 2015-10-08 NOTE — ED Notes (Signed)
Pt reports feeling achy all over x 2 months.  Denies fever.  Reports headaches and runny nose.

## 2015-10-10 LAB — CULTURE, GROUP A STREP (THRC)

## 2015-11-11 ENCOUNTER — Ambulatory Visit (INDEPENDENT_AMBULATORY_CARE_PROVIDER_SITE_OTHER): Payer: BLUE CROSS/BLUE SHIELD

## 2015-11-11 ENCOUNTER — Encounter: Payer: Self-pay | Admitting: Orthopaedic Surgery

## 2015-11-11 ENCOUNTER — Ambulatory Visit (INDEPENDENT_AMBULATORY_CARE_PROVIDER_SITE_OTHER): Payer: BLUE CROSS/BLUE SHIELD | Admitting: Orthopaedic Surgery

## 2015-11-11 VITALS — BP 112/81 | HR 93 | Temp 97.7°F | Resp 16 | Ht 66.0 in | Wt 165.0 lb

## 2015-11-11 DIAGNOSIS — M25552 Pain in left hip: Secondary | ICD-10-CM

## 2015-11-11 DIAGNOSIS — M5442 Lumbago with sciatica, left side: Secondary | ICD-10-CM | POA: Diagnosis not present

## 2015-11-11 MED ORDER — HYDROCODONE-ACETAMINOPHEN 7.5-325 MG PO TABS: 1.0000 | ORAL_TABLET | ORAL | Status: DC | PRN

## 2015-11-11 MED ORDER — PREDNISONE 10 MG (21) PO TBPK: ORAL_TABLET | ORAL | Status: DC

## 2015-11-11 NOTE — Progress Notes (Signed)
Subjective: my left hip hurts and my lower back hurts    Patient ID: Kevin [name], male    DOB: 12-29-1965, 50 y.o.   MRN: [id number]  Hip Pain  There was no injury mechanism. The pain is present in the left hip and left thigh. The quality of the pain is described as aching and shooting. The pain is at a severity of 5/10. The pain is moderate. The pain has been worsening since onset. Associated symptoms include a loss of motion, numbness and tingling. Pertinent negatives include no inability to bear weight, loss of sensation or muscle weakness. The symptoms are aggravated by weight bearing. He has tried ice, NSAIDs, rest and heat for the symptoms. The treatment provided mild relief.   He has had pain in the left hip for over a year now.  He has pain with standing and walking.  He has no trauma.  He has some numbness from the lower back going down the left leg to the area below the knee.  He is not getting any better, he is getting worse.  He is followed by [facility].   He is taking about 8 to 10 Aleve a day for his pain.  I have told him this is an excessively large dose of medicine.  He has no GERD or stomach problems.  He has used ice and heat with no pain.  He is tired of hurting.  He has a long history of lower back pain.   Review of Systems  Constitutional:       He does smoke  HENT: Negative for congestion.   Respiratory: Negative for cough and shortness of breath.   Cardiovascular: Negative for chest pain and leg swelling.  Endocrine: Positive for cold intolerance.  Musculoskeletal: Positive for back pain, joint swelling, arthralgias and gait problem.  Allergic/Immunologic: Positive for environmental allergies.  Neurological: Positive for tingling, numbness and headaches.   Past Medical History  Diagnosis Date  . 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 Arthritis   . Cancer ([place])   . Migraines   . Former consumption  of alcohol     quit 2013   Past Surgical History  Procedure Laterality Date  . Mandible fracture surgery  age 76  . Robotic assited partial nephrectomy Left 2012   Social History   Social History  . Marital Status: Divorced    Spouse Name: N/A  . Number of Children: N/A  . Years of Education: N/A   Occupational History  . Not on file.   Social History Main Topics  . Smoking status: Current Some Day Smoker -- 1.00 packs/day for 31 years    Types: Cigarettes  . Smokeless tobacco: Never Used  . Alcohol Use: No     Comment: used to/quit 2013  . Drug Use: No  . Sexual Activity: Not on file   Other Topics Concern  . Not on file   Social History Narrative   BP 112/81 mmHg  Pulse 93  Temp(Src) 97.7 F (36.5 C)  Resp 16  Ht 5\' 6"  (1.676 m)  Wt 165 lb (74.844 kg)  BMI 26.64 kg/m2     Objective:   Physical Exam  Constitutional: He is oriented to person, place, and time. He appears well-developed and well-nourished.  HENT:  Head: Normocephalic and atraumatic.  Eyes: Conjunctivae and EOM are normal. Pupils are equal, round, and reactive to light.  Neck: Normal range of  motion. Neck supple.  Cardiovascular: Normal rate, regular rhythm and intact distal pulses.   Pulmonary/Chest: Effort normal.  Abdominal: Soft.  Musculoskeletal: He exhibits tenderness (of the right hip with decreased internal and external motion, no redness.  Positive straight leg raising at 30 degrees left.).       Left hip: He exhibits decreased range of motion. He exhibits no swelling, no crepitus and no deformity.       Legs: Neurological: He is alert and oriented to person, place, and time. He has normal reflexes. No cranial nerve deficit. He exhibits normal muscle tone. Coordination normal.  Skin: Skin is warm and dry.  Psychiatric: He has a normal mood and affect. His behavior is normal. Judgment and thought content normal.    Right Hip Exam  Right hip exam is normal.    Left Hip Exam    Tenderness  The patient is experiencing no tenderness.     Range of Motion  Extension: 10  Flexion: 90  Internal Rotation: 10  External Rotation: 20  Abduction: normal  Adduction: normal   Muscle Strength  The patient has normal left hip strength.   Other  Erythema: absent Scars: present Sensation: normal Pulse: present   Back Exam   Tenderness  The patient is experiencing tenderness in the lumbar.  Range of Motion  Extension: 10  Flexion: 50  Lateral Bend Right: normal  Lateral Bend Left: normal  Rotation Right: normal  Rotation Left: normal   Muscle Strength  The patient has normal back strength.  Tests  Straight leg raise right: negative Straight leg raise left: positive  Reflexes  Patellar: normal Achilles: normal Babinski's sign: normal   Other  Toe Walk: normal Heel Walk: normal Sensation: normal Gait: normal  Erythema: no back redness Scars: absent       x-rays were done of the left hip and lower back. Assessment & Plan:   Encounter Diagnoses  Name Primary?  . Left hip pain Yes  . Midline low back pain with left-sided sciatica    I have given medicine for pain and prednisone.  I will see him in two weeks.  He may need a MRI of the left hip. Precautions given.  I have told him he really needs to stop smoking.  He has already had kidney cancer and did well from his surgery.  He has hypertension well controlled.

## 2015-11-25 ENCOUNTER — Ambulatory Visit: Payer: BLUE CROSS/BLUE SHIELD | Admitting: Orthopaedic Surgery

## 2015-11-26 ENCOUNTER — Encounter: Payer: Self-pay | Admitting: Orthopaedic Surgery

## 2015-11-26 ENCOUNTER — Ambulatory Visit (INDEPENDENT_AMBULATORY_CARE_PROVIDER_SITE_OTHER): Payer: BLUE CROSS/BLUE SHIELD | Admitting: Orthopaedic Surgery

## 2015-11-26 VITALS — BP 123/68 | HR 84 | Temp 97.9°F | Ht 66.0 in | Wt 165.0 lb

## 2015-11-26 DIAGNOSIS — M25552 Pain in left hip: Secondary | ICD-10-CM

## 2015-11-26 MED ORDER — NAPROXEN 500 MG PO TABS: 500.0000 mg | ORAL_TABLET | Freq: Two times a day (BID) | ORAL | Status: DC

## 2015-11-26 MED ORDER — CARISOPRODOL 350 MG PO TABS: 350.0000 mg | ORAL_TABLET | Freq: Three times a day (TID) | ORAL | Status: DC

## 2015-11-26 NOTE — Progress Notes (Signed)
Patient [id number] Kevin [name], male DOB:[birth year], 50 y.o. [id number]  Chief Complaint  Patient presents with  . Follow-up    Left hip    HPI  Kevin [name] is a 50 y.o. male who has left hip pain.  His left hip pain is worse.  He continues to have pain.  The prednisone helped only a little.  I am concerned about a possible avascular necrosis of the hip and I would like to get a MRI of the hip.  It will depend on his insurance company.  I will begin Naprosyn today.  Precautions given.  He has a limp to the left and is not significantly improved.  HPI  Body mass index is 26.64 kg/(m^2). Marland Kitchen  Review of Systems  Constitutional:       He does smoke  HENT: Negative for congestion.   Respiratory: Negative for cough and shortness of breath.   Cardiovascular: Negative for chest pain and leg swelling.  Endocrine: Positive for cold intolerance.  Musculoskeletal: Positive for back pain, joint swelling, arthralgias and gait problem.  Allergic/Immunologic: Positive for environmental allergies.  Neurological: Positive for numbness and headaches.    Past Medical History  Diagnosis Date  . 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 Arthritis   . Cancer ([place])   . Migraines   . Former consumption of alcohol     quit 2013    Past Surgical History  Procedure Laterality Date  . Mandible fracture surgery  age 45  . Robotic assited partial nephrectomy Left 2012    Family History  Problem Relation Age of Onset  . Cancer Mother     Social History Social History  Substance Use Topics  . Smoking status: Current Some Day Smoker -- 1.00 packs/day for 31 years    Types: Cigarettes  . Smokeless tobacco: Never Used  . Alcohol Use: No     Comment: used to/quit 2013    No Known Allergies  Current Outpatient Prescriptions  Medication Sig Dispense Refill  . amoxicillin (AMOXIL) 500 MG capsule Take 1 capsule (500 mg total) by mouth 3 (three) times daily. 21  capsule 0  . aspirin 325 MG tablet Take 325 mg by mouth daily as needed for mild pain or moderate pain.    . chlordiazePOXIDE (LIBRIUM) 25 MG capsule 50mg  PO TID x 1D, then 25-50mg  PO BID X 1D, then 25-50mg  PO QD X 1D 10 capsule 0  . HYDROcodone-acetaminophen (NORCO) 7.5-325 MG tablet Take 1 tablet by mouth every 4 (four) hours as needed for moderate pain (Must last 30 days.  Do not drive or operate machinery while taking this medicine.). 120 tablet 0  . LORazepam (ATIVAN) 1 MG tablet Take 1 tablet (1 mg total) by mouth 2 (two) times daily as needed for anxiety. 15 tablet 0  . metoprolol tartrate (LOPRESSOR) 25 MG tablet Take 25 mg by mouth 2 (two) times daily.    . Multiple Vitamins-Minerals (MEGA MULTI MEN PO) Take 2 tablets by mouth daily.    . predniSONE (STERAPRED UNI-PAK 21 TAB) 10 MG (21) TBPK tablet Take six pills the first day; take 5 pills the next day; then take 4 pills the next day; take 3 pills the following day; then take 2 pills then on the last day one pill by mouth. 21 tablet 1  . traMADol (ULTRAM) 50 MG tablet 1 or 2 po q6h 15 tablet 0  . carisoprodol (SOMA) 350 MG tablet Take 1 tablet (  350 mg total) by mouth 3 (three) times daily. 90 tablet 3  . naproxen (NAPROSYN) 500 MG tablet Take 1 tablet (500 mg total) by mouth 2 (two) times daily with a meal. 60 tablet 5   No current facility-administered medications for this visit.     Physical Exam  Blood pressure 123/68, pulse 84, temperature 97.9 F (36.6 C), height 5\' 6"  (1.676 m), weight 165 lb (74.844 kg).  Constitutional: overall normal hygiene, normal nutrition, well developed, normal grooming, normal body habitus. Assistive device:none  Musculoskeletal: gait and station Limp left, muscle tone and strength are normal, no tremors or atrophy is present.  .  Neurological: coordination overall normal.  Deep tendon reflex/nerve stretch intact.  Sensation normal.  Cranial nerves II-XII intact.   Skin:   normal overall no scars,  lesions, ulcers or rashes. No psoriasis.  Psychiatric: Alert and oriented x 3.  Recent memory intact, remote memory unclear.  Normal mood and affect. Well groomed.  Good eye contact.  Cardiovascular: overall no swelling, no varicosities, no edema bilaterally, normal temperatures of the legs and arms, no clubbing, cyanosis and good capillary refill.  Lymphatic: palpation is normal.  His back is not as tender as his hip today on the left.  His motion of the left hip is decreased with flexion to 90, internal to 10, external to 20, adduction and abduction nearly full.  He has pain with moving the hip.  The patient has been educated about the nature of the problem(s) and counseled on treatment options.  The patient appeared to understand what I have discussed and is in agreement with it.  Encounter Diagnosis  Name Primary?  . Left hip pain Yes    PLAN Call if any problems.  Precautions discussed.  Continue current medications.   Return to clinic after getting MRI.  I am concerned about avascular necrosis of the hip.

## 2015-11-30 ENCOUNTER — Emergency Department (HOSPITAL_COMMUNITY)
Admission: EM | Admit: 2015-11-30 | Discharge: 2015-11-30 | Disposition: A | Discharge status: Home / Self Care | Payer: BLUE CROSS/BLUE SHIELD | Attending: Emergency Medicine | Admitting: Emergency Medicine

## 2015-11-30 ENCOUNTER — Encounter (HOSPITAL_COMMUNITY): Payer: Self-pay | Admitting: Emergency Medicine

## 2015-11-30 DIAGNOSIS — Y999 Unspecified external cause status: Secondary | ICD-10-CM | POA: Diagnosis not present

## 2015-11-30 DIAGNOSIS — Z79891 Long term (current) use of opiate analgesic: Secondary | ICD-10-CM | POA: Insufficient documentation

## 2015-11-30 DIAGNOSIS — F17210 Nicotine dependence, cigarettes, uncomplicated: Secondary | ICD-10-CM | POA: Insufficient documentation

## 2015-11-30 DIAGNOSIS — S7011XA Contusion of right thigh, initial encounter: Secondary | ICD-10-CM

## 2015-11-30 DIAGNOSIS — Z791 Long term (current) use of non-steroidal anti-inflammatories (NSAID): Secondary | ICD-10-CM | POA: Diagnosis not present

## 2015-11-30 DIAGNOSIS — Y9389 Activity, other specified: Secondary | ICD-10-CM | POA: Insufficient documentation

## 2015-11-30 DIAGNOSIS — S79921A Unspecified injury of right thigh, initial encounter: Secondary | ICD-10-CM | POA: Diagnosis present

## 2015-11-30 DIAGNOSIS — W208XXA Other cause of strike by thrown, projected or falling object, initial encounter: Secondary | ICD-10-CM | POA: Insufficient documentation

## 2015-11-30 DIAGNOSIS — M1990 Unspecified osteoarthritis, unspecified site: Secondary | ICD-10-CM | POA: Insufficient documentation

## 2015-11-30 DIAGNOSIS — Z79899 Other long term (current) drug therapy: Secondary | ICD-10-CM | POA: Diagnosis not present

## 2015-11-30 DIAGNOSIS — Y929 Unspecified place or not applicable: Secondary | ICD-10-CM | POA: Insufficient documentation

## 2015-11-30 MED ORDER — OXYCODONE-ACETAMINOPHEN 5-325 MG PO TABS
1.0000 | ORAL_TABLET | Freq: Once | ORAL | Status: AC
  Administered 2015-11-30: 1 via ORAL
  Filled 2015-11-30: qty 1

## 2015-11-30 MED ORDER — TRAMADOL HCL 50 MG PO TABS: 50.0000 mg | ORAL_TABLET | Freq: Four times a day (QID) | ORAL | Status: DC | PRN

## 2015-11-30 NOTE — Discharge Instructions (Signed)
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
## 2015-12-02 ENCOUNTER — Ambulatory Visit (HOSPITAL_COMMUNITY)
Admission: RE | Admit: 2015-12-02 | Discharge: 2015-12-02 | Disposition: A | Discharge status: Home / Self Care | Payer: BLUE CROSS/BLUE SHIELD | Source: Ambulatory Visit | Attending: Orthopaedic Surgery | Admitting: Orthopaedic Surgery

## 2015-12-02 DIAGNOSIS — M1612 Unilateral primary osteoarthritis, left hip: Secondary | ICD-10-CM | POA: Insufficient documentation

## 2015-12-02 DIAGNOSIS — M25552 Pain in left hip: Secondary | ICD-10-CM

## 2015-12-09 ENCOUNTER — Ambulatory Visit (INDEPENDENT_AMBULATORY_CARE_PROVIDER_SITE_OTHER): Payer: BLUE CROSS/BLUE SHIELD | Admitting: Orthopaedic Surgery

## 2015-12-09 ENCOUNTER — Encounter: Payer: Self-pay | Admitting: Orthopaedic Surgery

## 2015-12-09 VITALS — BP 154/86 | HR 93 | Temp 98.1°F | Ht 66.0 in | Wt 155.0 lb

## 2015-12-09 DIAGNOSIS — M25552 Pain in left hip: Secondary | ICD-10-CM

## 2015-12-09 MED ORDER — HYDROCODONE-ACETAMINOPHEN 7.5-325 MG PO TABS: 1.0000 | ORAL_TABLET | ORAL | Status: DC | PRN

## 2015-12-09 NOTE — Progress Notes (Signed)
Patient [id number] Kevin [name], male DOB:[birth year], 50 y.o. [id number]  Chief Complaint  Patient presents with  . Results    MRI Results left hip    HPI  Kevin [name] is a 50 y.o. male who has left hip pain that is getting worse.  He had a MRI of the left hip done 12-02-15 showing: IMPRESSION: Mild to moderate hip joint degenerative changes for age. No stress fracture or AVN.  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  It is getting harder and harder for him to complete a shift.  He has pain after getting up from sitting as well.  He feels a grinding deep pain that lasts a while.  He wants to discuss surgery.  I went over risks and imponderables of a total hip procedure with him.  I will have him see Dr. Aline Brochure as I no longer do surgery.  normal.  HPI  Body mass index is 25.03 kg/(m^2).  Review of Systems  Constitutional:       He does smoke  HENT: Negative for congestion.   Respiratory: Negative for cough and shortness of breath.   Cardiovascular: Negative for chest pain and leg swelling.  Endocrine: Positive for cold intolerance.  Musculoskeletal: Positive for back pain, joint swelling, arthralgias and gait problem.  Allergic/Immunologic: Positive for environmental allergies.  Neurological: Positive for numbness and headaches.    Past Medical History  Diagnosis Date  . 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 Arthritis   . Cancer ([place])   . Migraines   . Former consumption of alcohol     quit 2013    Past Surgical History  Procedure Laterality Date  . Mandible fracture surgery  age 50  . Robotic assited partial nephrectomy Left 2012    Family History  Problem Relation Age of Onset  . Cancer Mother     Social History Social History  Substance Use Topics  . Smoking status: Current Some Day Smoker -- 1.00 packs/day for 31 years    Types: Cigarettes   . Smokeless tobacco: Never Used  . Alcohol Use: No     Comment: used to/quit 2013    No Known Allergies  Current Outpatient Prescriptions  Medication Sig Dispense Refill  . carisoprodol (SOMA) 350 MG tablet Take 1 tablet (350 mg total) by mouth 3 (three) times daily. 90 tablet 3  . HYDROcodone-acetaminophen (NORCO) 7.5-325 MG tablet Take 1 tablet by mouth every 4 (four) hours as needed for moderate pain (Must last 30 days.  Do not drive or operate machinery while taking this medicine.). 120 tablet 0  . metoprolol tartrate (LOPRESSOR) 25 MG tablet Take 25 mg by mouth 2 (two) times daily.    . Multiple Vitamins-Minerals (MEGA MULTI MEN PO) Take 2 tablets by mouth daily.    . naproxen (NAPROSYN) 500 MG tablet Take 1 tablet (500 mg total) by mouth 2 (two) times daily with a meal. 60 tablet 5  . traMADol (ULTRAM) 50 MG tablet Take 1 tablet (50 mg total) by mouth every 6 (six) hours as needed. 15 tablet 0   No current facility-administered medications for this visit.     Physical Exam  Blood pressure 154/86, pulse 93, temperature 98.1 F (36.7 C), height 5\' 6"  (1.676 m), weight 155 lb (70.308 kg).  Constitutional: overall normal hygiene, normal nutrition, well developed, normal grooming, normal body  habitus. Assistive device:none  Musculoskeletal: gait and station Limp left, muscle tone and strength are normal, no tremors or atrophy is present.  .  Neurological: coordination overall normal.  Deep tendon reflex/nerve stretch intact.  Sensation normal.  Cranial nerves II-XII intact.   Skin:   normal overall no scars, lesions, ulcers or rashes. No psoriasis.  Psychiatric: Alert and oriented x 3.  Recent memory intact, remote memory unclear.  Normal mood and affect. Well groomed.  Good eye contact.  Cardiovascular: overall no swelling, no varicosities, no edema bilaterally, normal temperatures of the legs and arms, no clubbing, cyanosis and good capillary refill.  Lymphatic: palpation  is normal.  Right Hip Exam  Right hip exam is normal.    Left Hip Exam   Tenderness  The patient is experiencing tenderness in the greater trochanter and anterior.  Range of Motion  Extension: 10  Flexion: 90  Internal Rotation: 15  External Rotation: 20  Abduction: 30  Adduction: 20   Muscle Strength  The patient has normal left hip strength.   Other  Erythema: absent Scars: absent Sensation: normal Pulse: present      The patient has been educated about the nature of the problem(s) and counseled on treatment options.  The patient appeared to understand what I have discussed and is in agreement with it.  Encounter Diagnosis  Name Primary?  . Left hip pain Yes    PLAN Call if any problems.  Precautions discussed.  Continue current medications.   Return to clinic to see Dr. Aline Brochure

## 2015-12-09 NOTE — Patient Instructions (Signed)
See Dr. Aline Brochure, talk about total hip

## 2015-12-10 ENCOUNTER — Ambulatory Visit: Payer: BLUE CROSS/BLUE SHIELD | Admitting: Orthopaedic Surgery

## 2015-12-16 ENCOUNTER — Ambulatory Visit (INDEPENDENT_AMBULATORY_CARE_PROVIDER_SITE_OTHER): Payer: BLUE CROSS/BLUE SHIELD | Admitting: Orthopedic Surgery

## 2015-12-16 ENCOUNTER — Ambulatory Visit: Payer: BLUE CROSS/BLUE SHIELD | Admitting: Orthopedic Surgery

## 2015-12-16 VITALS — BP 129/69 | Ht 66.0 in | Wt 155.0 lb

## 2015-12-16 DIAGNOSIS — M1612 Unilateral primary osteoarthritis, left hip: Secondary | ICD-10-CM

## 2015-12-16 DIAGNOSIS — M25552 Pain in left hip: Secondary | ICD-10-CM | POA: Diagnosis not present

## 2015-12-16 DIAGNOSIS — M1990 Unspecified osteoarthritis, unspecified site: Secondary | ICD-10-CM | POA: Diagnosis not present

## 2015-12-16 NOTE — Progress Notes (Signed)
Chief Complaint  Patient presents with  . 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  Respiratory: Negative for shortness of breath.   Cardiovascular: Negative for chest pain.  Musculoskeletal: Positive for back pain.    Past Medical History  Diagnosis Date  . 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 Arthritis   . Cancer ([place])   . Migraines   . Former consumption of alcohol     quit 2013    Past Surgical History  Procedure Laterality Date  . Mandible fracture surgery  age 59  . Robotic assited partial nephrectomy Left 2012   Family History  Problem Relation Age of Onset  . Cancer Mother    Social History  Substance Use Topics  . Smoking status: Current Some Day Smoker -- 1.00 packs/day for 31 years    Types: Cigarettes  . Smokeless tobacco: Never Used  . Alcohol Use: No     Comment: used to/quit 2013    Current outpatient prescriptions:  .  carisoprodol (SOMA) 350 MG tablet, Take 1 tablet (350 mg total) by mouth 3 (three) times daily., Disp: 90 tablet, Rfl: 3 .  HYDROcodone-acetaminophen (NORCO) 7.5-325 MG tablet, Take 1 tablet by mouth every 4 (four) hours as needed for moderate pain (Must last 30 days.  Do not drive or operate machinery while taking this medicine.)., Disp: 120 tablet, Rfl: 0 .  metoprolol tartrate (LOPRESSOR) 25 MG tablet, Take 25 mg by mouth 2 (two) times daily., Disp: , Rfl:  .  Multiple Vitamins-Minerals (MEGA  MULTI MEN PO), Take 2 tablets by mouth daily., Disp: , Rfl:  .  naproxen (NAPROSYN) 500 MG tablet, Take 1 tablet (500 mg total) by mouth 2 (two) times daily with a meal., Disp: 60 tablet, Rfl: 5 .  traMADol (ULTRAM) 50 MG tablet, Take 1 tablet (50 mg total) by mouth every 6 (six) hours as needed., Disp: 15 tablet, Rfl: 0  BP 129/69 mmHg  Ht 5\' 6"  (1.676 m)  Wt 155 lb (70.308 kg)  BMI 25.03 kg/m2  Physical Exam  Constitutional: He is oriented to person, place, and time. He appears well-developed and well-nourished. No distress.  Cardiovascular: Normal rate and intact distal pulses.   Neurological: He is alert and oriented to person, place, and time.  Skin: Skin is warm and dry. No rash noted. He is not diaphoretic. No erythema. No pallor.  Psychiatric: He has a normal mood and affect. His behavior is normal. Judgment and thought content normal.    Right Hip Exam   Tenderness  The patient is experiencing no tenderness.     Range of Motion  Flexion: normal  Internal Rotation: normal   Muscle Strength  Abduction: 5/5  Flexion: 5/5   Tests  FABER: negative [place]: negative  Other  Erythema: absent Scars: absent Sensation: normal Pulse: present   Left Hip Exam   Tenderness  The patient is experiencing no tenderness.     Range  of Motion  Flexion: normal  Internal Rotation: 10 abnormal  Abduction: normal   Muscle Strength  The patient has normal left hip strength.   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 He may be a candidate for anterior hip replacement as well discuss when he comes back.

## 2015-12-16 NOTE — Patient Instructions (Addendum)
Generic Hip Exercises RANGE OF MOTION (ROM) AND STRETCHING EXERCISES  These exercises may help you when beginning to rehabilitate your injury. Doing them too aggressively can worsen your condition. Complete them slowly and gently. Your symptoms may resolve with or without further involvement from your physician, physical therapist or athletic trainer. While completing these exercises, remember:   Restoring tissue flexibility helps normal motion to return to the joints. This allows healthier, less painful movement and activity.  An effective stretch should be held for at least 30 seconds.  A stretch should never be painful. You should only feel a gentle lengthening or release in the stretched tissue. If these stretches worsen your symptoms even when done gently, consult your physician, physical therapist or athletic trainer. STRETCH - Hamstrings, Supine   Lie on your back. Loop a belt or towel over the ball of your right / left foot.  Straighten your right / left knee and slowly pull on the belt to raise your leg. Do not allow the right / left knee to bend. Keep your opposite leg flat on the floor.  Raise the leg until you feel a gentle stretch behind your right / left knee or thigh. Hold this position for _____3_____ seconds. Repeat __15________ times. Complete this stretch _____1_____ times per day.  STRETCH - Hip Rotators   Lie on your back on a firm surface. Grasp your right / left knee with your right / left hand and your ankle with your opposite hand.  Keeping your hips and shoulders firmly planted, gently pull your right / left knee and rotate your lower leg toward your opposite shoulder until you feel a stretch in your buttocks.  Hold this stretch for __________ seconds. Repeat this stretch __________ times. Complete this stretch __________ times per day. STRETCH - Hamstrings/Adductors, V-Sit   Sit on the floor with your legs extended in a large "V," keeping your knees  straight.  With your head and chest upright, bend at your waist reaching for your right foot to stretch your left adductors.  You should feel a stretch in your left inner thigh. Hold for __________ seconds.  Return to the upright position to relax your leg muscles.  Continuing to keep your chest upright, bend straight forward at your waist to stretch your hamstrings.  You should feel a stretch behind both of your thighs and/or knees. Hold for __________ seconds.  Return to the upright position to relax your leg muscles.  Repeat steps 2 through 4 for opposite leg. Repeat __________ times. Complete this exercise __________ times per day.  STRETCHING - Hip Flexors, Lunge  Half kneel with your right / left knee on the floor and your opposite knee bent and directly over your ankle.  Keep good posture with your head over your shoulders. Tighten your buttocks to point your tailbone downward; this will prevent your back from arching too much.  You should feel a gentle stretch in the front of your thigh and/or hip. If you do not feel any resistance, slightly slide your opposite foot forward and then slowly lunge forward so your knee once again lines up over your ankle. Be sure your tailbone remains pointed downward.  Hold this stretch for __________ seconds. Repeat __________ times. Complete this stretch __________ times per day. STRENGTHENING EXERCISES These exercises may help you when beginning to rehabilitate your injury. They may resolve your symptoms with or without further involvement from your physician, physical therapist or athletic trainer. While completing these exercises, remember:  Muscles can gain both the endurance and the strength needed for everyday activities through controlled exercises.  Complete these exercises as instructed by your physician, physical therapist or athletic trainer. Progress the resistance and repetitions only as guided.  You may experience muscle soreness or fatigue, but  the pain or discomfort you are trying to eliminate should never worsen during these exercises. If this pain does worsen, stop and make certain you are following the directions exactly. If the pain is still present after adjustments, discontinue the exercise until you can discuss the trouble with your clinician. STRENGTH - Hip Extensors, Bridge   Lie on your back on a firm surface. Bend your knees and place your feet flat on the floor.  Tighten your buttocks muscles and lift your bottom off the floor until your trunk is level with your thighs. You should feel the muscles in your buttocks and back of your thighs working. If you do not feel these muscles, slide your feet 1-2 inches further away from your buttocks.  Hold this position for __________ seconds.  Slowly lower your hips to the starting position and allow your buttock muscles relax completely before beginning the next repetition.  If this exercise is too easy, you may cross your arms over your chest. Repeat __________ times. Complete this exercise __________ times per day.  STRENGTH - Hip Abductors, Straight Leg Raises  Be aware of your form throughout the entire exercise so that you exercise the correct muscles. Sloppy form means that you are not strengthening the correct muscles.  Lie on your side so that your head, shoulders, knee and hip line up. You may bend your lower knee to help maintain your balance. Your right / left leg should be on top.  Roll your hips slightly forward, so that your hips are stacked directly over each other and your right / left knee is facing forward.  Lift your top leg up 4-6 inches, leading with your heel. Be sure that your foot does not drift forward or that your knee does not roll toward the ceiling.  Hold this position for __________ seconds. You should feel the muscles in your outer hip lifting (you may not notice this until your leg begins to tire).  Slowly lower your leg to the starting position.  Allow the muscles to fully relax before beginning the next repetition. Repeat __________ times. Complete this exercise __________ times per day.  STRENGTH - Hip Adductors, Straight Leg Raises   Lie on your side so that your head, shoulders, knee and hip line up. You may place your upper foot in front to help maintain your balance. Your right / left leg should be on the bottom.  Roll your hips slightly forward, so that your hips are stacked directly over each other and your right / left knee is facing forward.  Tense the muscles in your inner thigh and lift your bottom leg 4-6 inches. Hold this position for __________ seconds.  Slowly lower your leg to the starting position. Allow the muscles to fully relax before beginning the next repetition. Repeat __________ times. Complete this exercise __________ times per day.  STRENGTH - Quadriceps, Straight Leg Raises  Quality counts! Watch for signs that the quadriceps muscle is working to insure you are strengthening the correct muscles and not "cheating" by substituting with healthier muscles.  Lay on your back with your right / left leg extended and your opposite knee bent.  Tense the muscles in the front of your right /   left thigh. You should see either your knee cap slide up or increased dimpling just above the knee. Your thigh may even quiver.  Tighten these muscles even more and raise your leg 4 to 6 inches off the floor. Hold for right / left seconds.  Keeping these muscles tense, lower your leg.  Relax the muscles slowly and completely in between each repetition. Repeat __________ times. Complete this exercise __________ times per day.  STRENGTH - Hip Abductors, Standing  Tie one end of a rubber exercise band/tubing to a secure surface (table, pole) and tie a loop at the other end.  Place the loop around your right / left ankle. Keeping your ankle with the band directly opposite of the secured end, step away until  there is tension in the tube/band.  Hold onto a chair as needed for balance.  Keeping your back upright, your shoulders over your hips, and your toes pointing forward, lift your right / left leg out to your side. Be sure to lift your leg with your hip muscles. Do not "throw" your leg or tip your body to lift your leg.  Slowly and with control, return to the starting position. Repeat exercise __________ times. Complete this exercise __________ times per day.  STRENGTH - Quadriceps, Squats  Stand in a door frame so that your feet and knees are in line with the frame.  Use your hands for balance, not support, on the frame.  Slowly lower your weight, bending at the hips and knees. Keep your lower legs upright so that they are parallel with the door frame. Squat only within the range that does not increase your knee pain. Never let your hips drop below your knees.  Slowly return upright, pushing with your legs, not pulling with your hands.   This information is not intended to replace advice given to you by your health care provider. Make sure you discuss any questions you have with your health care provider.   Document Released: 08/05/2005 Document Revised: 08/08/2014 Document Reviewed: 10/30/2008 Elsevier Interactive Patient Education [employer].

## 2015-12-17 NOTE — Addendum Note (Signed)
Addended by: Baldomero Lamy B on: 12/17/2015 12:15 PM   Modules accepted: Orders

## 2015-12-24 ENCOUNTER — Emergency Department (HOSPITAL_COMMUNITY): Payer: BLUE CROSS/BLUE SHIELD

## 2015-12-24 ENCOUNTER — Emergency Department (HOSPITAL_COMMUNITY)
Admission: EM | Admit: 2015-12-24 | Discharge: 2015-12-24 | Disposition: A | Discharge status: Home / Self Care | Payer: BLUE CROSS/BLUE SHIELD | Attending: Emergency Medicine | Admitting: Emergency Medicine

## 2015-12-24 ENCOUNTER — Encounter (HOSPITAL_COMMUNITY): Payer: Self-pay | Admitting: Emergency Medicine

## 2015-12-24 DIAGNOSIS — M1990 Unspecified osteoarthritis, unspecified site: Secondary | ICD-10-CM | POA: Insufficient documentation

## 2015-12-24 DIAGNOSIS — R05 Cough: Secondary | ICD-10-CM | POA: Insufficient documentation

## 2015-12-24 DIAGNOSIS — F17210 Nicotine dependence, cigarettes, uncomplicated: Secondary | ICD-10-CM | POA: Diagnosis not present

## 2015-12-24 DIAGNOSIS — R059 Cough, unspecified: Secondary | ICD-10-CM

## 2015-12-24 MED ORDER — PREDNISONE 10 MG (21) PO TBPK
10.0000 mg | ORAL_TABLET | Freq: Every day | ORAL | Status: DC
Start: 2015-12-24 — End: 2016-02-21

## 2015-12-24 MED ORDER — ALBUTEROL SULFATE HFA 108 (90 BASE) MCG/ACT IN AERS: 1.0000 | INHALATION_SPRAY | RESPIRATORY_TRACT | Status: DC | PRN

## 2015-12-24 MED ORDER — DOXYCYCLINE HYCLATE 100 MG PO CAPS: 100.0000 mg | ORAL_CAPSULE | Freq: Two times a day (BID) | ORAL | Status: DC

## 2015-12-24 MED ORDER — GUAIFENESIN-CODEINE 100-10 MG/5ML PO SOLN: 5.0000 mL | Freq: Four times a day (QID) | ORAL | Status: DC | PRN

## 2015-12-24 NOTE — ED Provider Notes (Signed)
CSN: [id number]     Arrival date & time 12/24/15  1015 History   First MD Initiated Contact with Patient 12/24/15 1056     Chief Complaint  Patient presents with  . 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 He is a chronic daily smoker with a 31-pack-year history. The patient states that about one month ago he developed URI symptoms. He thought maybe he had allergies. He's had intermittent fevers up to 102. Patient states this seemed to be getting better, but over the past few days has returned and worsened. He has productive cough, wheezing, malaise and fatigue. He denies unexplained weight loss or soaking night sweats. He denies tick bites.  Past Medical History  Diagnosis Date  . 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 Arthritis   . Cancer (Kevin [name])   . Migraines   . Former consumption of alcohol     quit 2013   Past Surgical History  Procedure Laterality Date  . Mandible fracture surgery  age 51  . Robotic assited partial nephrectomy Left 2012   Family History  Problem Relation Age of Onset  . Cancer Mother    Social History  Substance Use Topics  . Smoking status: Current Some Day Smoker -- 1.00 packs/day for 31 years    Types: Cigarettes  . Smokeless tobacco: Never Used  . Alcohol Use: No     Comment: used to/quit 2013    Review of Systems   Ten systems reviewed and are negative for acute change, except as noted in the HPI.   Allergies  Review of patient's allergies indicates no known allergies.  Home Medications   Prior to Admission medications   Medication Sig Start Date End Date Taking? Authorizing Provider  albuterol (PROVENTIL HFA;VENTOLIN HFA) 108 (90 Base) MCG/ACT inhaler Inhale 1-2 puffs into  the lungs every 4 (four) hours as needed for wheezing or shortness of breath. 12/24/15   Margarita Mail, PA-C  carisoprodol (SOMA) 350 MG tablet Take 1 tablet (350 mg total) by mouth 3 (three) times daily. 11/26/15   Sanjuana Kava, MD  doxycycline (VIBRAMYCIN) 100 MG capsule Take 1 capsule (100 mg total) by mouth 2 (two) times daily. 12/24/15   Margarita Mail, PA-C  guaiFENesin-codeine 100-10 MG/5ML syrup Take 5-10 mLs by mouth every 6 (six) hours as needed for cough. 12/24/15   Margarita Mail, PA-C  HYDROcodone-acetaminophen (NORCO) 7.5-325 MG tablet Take 1 tablet by mouth every 4 (four) hours as needed for moderate pain (Must last 30 days.  Do not drive or operate machinery while taking this medicine.). 12/09/15   Sanjuana Kava, MD  metoprolol tartrate (LOPRESSOR) 25 MG tablet Take 25 mg by mouth 2 (two) times daily.    Historical Provider, MD  Multiple Vitamins-Minerals (MEGA MULTI MEN PO) Take 2 tablets by mouth daily.    Historical Provider, MD  naproxen (NAPROSYN) 500 MG tablet Take 1 tablet (500 mg total) by mouth 2 (two) times daily with a meal. 11/26/15   Sanjuana Kava, MD  predniSONE (STERAPRED UNI-PAK 21 TAB) 10 MG (21) TBPK tablet Take 1 tablet (10 mg total) by mouth daily. Take 6 tabs by mouth daily  for 2 days, then 5 tabs for 2 days, then 4 tabs for 2 days, then 3 tabs for 2 days, 2 tabs for 2  days, then 1 tab by mouth daily for 2 days 12/24/15   Margarita Mail, PA-C  traMADol (ULTRAM) 50 MG tablet Take 1 tablet (50 mg total) by mouth every 6 (six) hours as needed. 11/30/15   Hope Bunnie Pion, NP   BP 126/73 mmHg  Pulse 87  Temp(Src) 98.7 F (37.1 C) (Oral)  Resp 24  Ht 5\' 6"  (1.676 m)  Wt 72.576 kg  BMI 25.84 kg/m2  SpO2 100% Physical Exam  Constitutional: He appears well-developed and well-nourished. No distress.  HENT:  Head: Normocephalic and atraumatic.  Eyes: Conjunctivae are normal. No scleral icterus.  Neck: Normal range of motion. Neck supple.  Cardiovascular: Normal rate,  regular rhythm and normal heart sounds.   Pulmonary/Chest: Effort normal and breath sounds normal. No respiratory distress.  Dry cough, persistent throughout visit  Abdominal: Soft. There is no tenderness.  Musculoskeletal: He exhibits no edema.  Neurological: He is alert.  Skin: Skin is warm and dry. He is not diaphoretic.  Psychiatric: His behavior is normal.  Nursing note and vitals reviewed.   ED Course  Procedures (including critical care time) Labs Review Labs Reviewed - No data to display  Imaging Review Dg Chest 2 View  12/24/2015  CLINICAL DATA:  Productive cough, chest congestion, fever for the past 3-4 weeks. Smoker. EXAM: CHEST  2 VIEW COMPARISON:  Previous examinations, the most recent dated 03/09/2015. FINDINGS: Normal sized heart. Stable prominent left lower thoracic paravertebral osteophyte and additional lower thoracic spine degenerative changes and mild scoliosis. Clear lungs. IMPRESSION: No acute abnormality. Electronically Signed   By: Claudie Revering M.D.   On: 12/24/2015 11:03   I have personally reviewed and evaluated these images and lab results as part of my medical decision-making.   EKG Interpretation None      MDM   Final diagnoses:  Cough    Patient with persistent cough, intermittent fevers. Given his history of recurrence and fevers. I'm going to treat the patient with antibiotic myotic. Patient will be given doxycycline. We'll also give him a prednisone Dosepak, inhaler and cough suppressant. Patient is advised to follow up with a primary care physician. He appears safe for discharge at this time.    Margarita Mail, PA-C 12/24/15 [place], MD 12/24/15 [phone]

## 2015-12-24 NOTE — Discharge Instructions (Signed)
Upper Respiratory Infection, Adult Most upper respiratory infections (URIs) are a viral infection of the air passages leading to the lungs. A URI affects the nose, throat, and upper air passages. The most common type of URI is nasopharyngitis and is typically referred to as "the common cold." URIs run their course and usually go away on their own. Most of the time, a URI does not require medical attention, but sometimes a bacterial infection in the upper airways can follow a viral infection. This is called a secondary infection. Sinus and middle ear infections are common types of secondary upper respiratory infections. Bacterial pneumonia can also complicate a URI. A URI can worsen asthma and chronic obstructive pulmonary disease (COPD). Sometimes, these complications can require emergency medical care and may be life threatening.  CAUSES Almost all URIs are caused by viruses. A virus is a type of germ and can spread from one person to another.  RISKS FACTORS You may be at risk for a URI if:   You smoke.   You have chronic heart or lung disease.  You have a weakened defense (immune) system.   You are very young or very old.   You have nasal allergies or asthma.  You work in crowded or poorly ventilated areas.  You work in health care facilities or schools. SIGNS AND SYMPTOMS  Symptoms typically develop 2-3 days after you come in contact with a cold virus. Most viral URIs last 7-10 days. However, viral URIs from the influenza virus (flu virus) can last 14-18 days and are typically more severe. Symptoms may include:   Runny or stuffy (congested) nose.   Sneezing.   Cough.   Sore throat.   Headache.   Fatigue.   Fever.   Loss of appetite.   Pain in your forehead, behind your eyes, and over your cheekbones (sinus pain).  Muscle aches.  DIAGNOSIS  Your health care provider may diagnose a URI by:  Physical exam.  Tests to check that your symptoms are not due to  another condition such as:  Strep throat.  Sinusitis.  Pneumonia.  Asthma. TREATMENT  A URI goes away on its own with time. It cannot be cured with medicines, but medicines may be prescribed or recommended to relieve symptoms. Medicines may help:  Reduce your fever.  Reduce your cough.  Relieve nasal congestion. HOME CARE INSTRUCTIONS   Take medicines only as directed by your health care provider.   Gargle warm saltwater or take cough drops to comfort your throat as directed by your health care provider.  Use a warm mist humidifier or inhale steam from a shower to increase air moisture. This may make it easier to breathe.  Drink enough fluid to keep your urine clear or pale yellow.   Eat soups and other clear broths and maintain good nutrition.   Rest as needed.   Return to work when your temperature has returned to normal or as your health care provider advises. You may need to stay home longer to avoid infecting others. You can also use a face mask and careful hand washing to prevent spread of the virus.  Increase the usage of your inhaler if you have asthma.   Do not use any tobacco products, including cigarettes, chewing tobacco, or electronic cigarettes. If you need help quitting, ask your health care provider. PREVENTION  The best way to protect yourself from getting a cold is to practice good hygiene.   Avoid oral or hand contact with people with cold   symptoms.   Wash your hands often if contact occurs.  There is no clear evidence that vitamin C, vitamin E, echinacea, or exercise reduces the chance of developing a cold. However, it is always recommended to get plenty of rest, exercise, and practice good nutrition.  SEEK MEDICAL CARE IF:   You are getting worse rather than better.   Your symptoms are not controlled by medicine.   You have chills.  You have worsening shortness of breath.  You have brown or red mucus.  You have yellow or brown nasal  discharge.  You have pain in your face, especially when you bend forward.  You have a fever.  You have swollen neck glands.  You have pain while swallowing.  You have white areas in the back of your throat. SEEK IMMEDIATE MEDICAL CARE IF:   You have severe or persistent:  Headache.  Ear pain.  Sinus pain.  Chest pain.  You have chronic lung disease and any of the following:  Wheezing.  Prolonged cough.  Coughing up blood.  A change in your usual mucus.  You have a stiff neck.  You have changes in your:  Vision.  Hearing.  Thinking.  Mood. MAKE SURE YOU:   Understand these instructions.  Will watch your condition.  Will get help right away if you are not doing well or get worse.   This information is not intended to replace advice given to you by your health care provider. Make sure you discuss any questions you have with your health care provider.   Document Released: 01/11/2001 Document Revised: 12/02/2014 Document Reviewed: 10/23/2013 Elsevier Interactive Patient Education 2016 [employer]  

## 2015-12-24 NOTE — ED Notes (Signed)
Pt states he has had cough, congestion, intermittent fever, and body aches for over 2 weeks.

## 2015-12-29 ENCOUNTER — Ambulatory Visit (HOSPITAL_COMMUNITY): Admission: RE | Admit: 2015-12-29 | Payer: BLUE CROSS/BLUE SHIELD | Source: Ambulatory Visit

## 2015-12-29 ENCOUNTER — Telehealth: Payer: Self-pay | Admitting: Orthopedic Surgery

## 2015-12-29 NOTE — Telephone Encounter (Signed)
Patient has called with question about epidural steroid injection appointment in [place];  Needs to speak with nurse.  Patient does not have voice mail set up on [phone]; however, has Mychart account.

## 2015-12-30 NOTE — Telephone Encounter (Signed)
Returned patient's call, stated that he had rescheduled his hip injection.

## 2015-12-31 ENCOUNTER — Ambulatory Visit (HOSPITAL_COMMUNITY): Payer: BLUE CROSS/BLUE SHIELD

## 2016-01-07 ENCOUNTER — Other Ambulatory Visit: Payer: Self-pay | Admitting: *Deleted

## 2016-01-07 ENCOUNTER — Telehealth: Payer: Self-pay | Admitting: Orthopedic Surgery

## 2016-01-07 MED ORDER — HYDROCODONE-ACETAMINOPHEN 7.5-325 MG PO TABS: 1.0000 | ORAL_TABLET | ORAL | Status: DC | PRN

## 2016-01-07 NOTE — Telephone Encounter (Signed)
Hydrocodone-Acetaminophen 7.5/325mg  Qty 120 Tablets  Take 1 tablet by mouth every 4 (four) hours as needed for moderate pain.  Must last 30 days. (Do not drive or operate machinery while taking this medicine)

## 2016-01-07 NOTE — Telephone Encounter (Signed)
Prescription available 

## 2016-01-27 ENCOUNTER — Ambulatory Visit (HOSPITAL_COMMUNITY): Admission: RE | Admit: 2016-01-27 | Payer: BLUE CROSS/BLUE SHIELD | Source: Ambulatory Visit

## 2016-02-08 ENCOUNTER — Telehealth: Payer: Self-pay | Admitting: Radiology

## 2016-02-08 MED ORDER — HYDROCODONE-ACETAMINOPHEN 7.5-325 MG PO TABS: 1.0000 | ORAL_TABLET | ORAL | Status: DC | PRN

## 2016-02-08 NOTE — Telephone Encounter (Signed)
Rx done. 

## 2016-02-08 NOTE — Telephone Encounter (Signed)
Wants medication refilled

## 2016-02-21 ENCOUNTER — Encounter (HOSPITAL_COMMUNITY): Payer: Self-pay

## 2016-02-21 ENCOUNTER — Emergency Department (HOSPITAL_COMMUNITY)
Admission: EM | Admit: 2016-02-21 | Discharge: 2016-02-21 | Disposition: A | Discharge status: Home / Self Care | Payer: BLUE CROSS/BLUE SHIELD | Attending: Emergency Medicine | Admitting: Emergency Medicine

## 2016-02-21 DIAGNOSIS — F17210 Nicotine dependence, cigarettes, uncomplicated: Secondary | ICD-10-CM | POA: Diagnosis not present

## 2016-02-21 DIAGNOSIS — Z85528 Personal history of other malignant neoplasm of kidney: Secondary | ICD-10-CM | POA: Diagnosis not present

## 2016-02-21 DIAGNOSIS — Z79899 Other long term (current) drug therapy: Secondary | ICD-10-CM | POA: Diagnosis not present

## 2016-02-21 DIAGNOSIS — F419 Anxiety disorder, unspecified: Secondary | ICD-10-CM | POA: Diagnosis present

## 2016-02-21 LAB — URINALYSIS, ROUTINE W REFLEX MICROSCOPIC
Bilirubin Urine: NEGATIVE
Glucose, UA: NEGATIVE mg/dL
Hgb urine dipstick: NEGATIVE
Ketones, ur: NEGATIVE mg/dL
LEUKOCYTES UA: NEGATIVE
Nitrite: NEGATIVE
PH: 6 (ref 5.0–8.0)
Protein, ur: NEGATIVE mg/dL
SPECIFIC GRAVITY, URINE: 1.02 (ref 1.005–1.030)

## 2016-02-21 LAB — CBC WITH DIFFERENTIAL/PLATELET
BASOS ABS: 0 10*3/uL (ref 0.0–0.1)
Basophils Relative: 0 %
EOS PCT: 1 %
Eosinophils Absolute: 0.1 10*3/uL (ref 0.0–0.7)
HEMATOCRIT: 34.8 % — AB (ref 39.0–52.0)
Hemoglobin: 12.5 g/dL — ABNORMAL LOW (ref 13.0–17.0)
LYMPHS ABS: 1.9 10*3/uL (ref 0.7–4.0)
LYMPHS PCT: 27 %
MCH: 31.6 pg (ref 26.0–34.0)
MCHC: 35.9 g/dL (ref 30.0–36.0)
MCV: 87.9 fL (ref 78.0–100.0)
MONO ABS: 0.6 10*3/uL (ref 0.1–1.0)
Monocytes Relative: 8 %
NEUTROS ABS: 4.6 10*3/uL (ref 1.7–7.7)
Neutrophils Relative %: 64 %
Platelets: 178 10*3/uL (ref 150–400)
RBC: 3.96 MIL/uL — AB (ref 4.22–5.81)
RDW: 11.7 % (ref 11.5–15.5)
WBC: 7.2 10*3/uL (ref 4.0–10.5)

## 2016-02-21 LAB — RAPID URINE DRUG SCREEN, HOSP PERFORMED
AMPHETAMINES: NOT DETECTED
BARBITURATES: NOT DETECTED
BENZODIAZEPINES: POSITIVE — AB
Cocaine: NOT DETECTED
Opiates: POSITIVE — AB
TETRAHYDROCANNABINOL: NOT DETECTED

## 2016-02-21 LAB — BASIC METABOLIC PANEL
ANION GAP: 6 (ref 5–15)
BUN: 9 mg/dL (ref 6–20)
CO2: 25 mmol/L (ref 22–32)
Calcium: 8.9 mg/dL (ref 8.9–10.3)
Chloride: 105 mmol/L (ref 101–111)
Creatinine, Ser: 0.88 mg/dL (ref 0.61–1.24)
GFR calc Af Amer: >60 mL/min (ref >60)
GFR calc non Af Amer: >60 mL/min (ref >60)
GLUCOSE: 93 mg/dL (ref 65–99)
POTASSIUM: 3.5 mmol/L (ref 3.5–5.1)
Sodium: 136 mmol/L (ref 135–145)

## 2016-02-21 LAB — ETHANOL

## 2016-02-21 MED ORDER — HYDROXYZINE HCL 25 MG PO TABS: 25.0000 mg | ORAL_TABLET | Freq: Four times a day (QID) | ORAL | 0 refills | Status: DC | PRN

## 2016-02-21 MED ORDER — HYDROXYZINE HCL 50 MG/ML IM SOLN
50.0000 mg | Freq: Once | INTRAMUSCULAR | Status: AC
  Administered 2016-02-21: 50 mg via INTRAMUSCULAR
  Filled 2016-02-21: qty 1

## 2016-02-21 NOTE — Discharge Instructions (Signed)
Follow-up with a primary care doctor as soon as possible for further evaluation and treatment of your anxiety.

## 2016-02-21 NOTE — ED Provider Notes (Signed)
[facility] DEPT Provider Note   CSN: [id number] Arrival date & time: 02/21/16  [id number]  First Provider Contact:  10:01 AM   By signing my name below, I, Kevin [name], attest that this documentation has been prepared under the direction and in the presence of [name], MD . Electronically Signed: Evelene [name], Scribe. 02/21/2016. 10:11 AM   History   Chief Complaint Chief Complaint  Patient presents with  . Anxiety    The history is provided by the patient. No language interpreter was used.     HPI Comments:  Kevin [name] is a 50 y.o. male brought in by ambulance, who presents to the Emergency Department complaining of generalized shaking x 2-3 days; worse today. Pt reports h/o anxiety; denies medical treatment in the past.  Pt states he is "worried about everything". No alleviating factors noted. He has no other complaints at this time. Pt is a current smoker; denies use of alcohol and illicit drugs/    Past Medical History:  Diagnosis Date  . Arthritis   . Cancer ([place])   . Former consumption of alcohol    quit 2013  . Left renal mass 2012   renal cell carcinoma, completely excised  . Migraines     There are no active problems to display for this patient.   Past Surgical History:  Procedure Laterality Date  . MANDIBLE FRACTURE SURGERY  age 28  . ROBOTIC ASSITED PARTIAL NEPHRECTOMY Left 2012     Home Medications    Prior to Admission medications   Medication Sig Start Date End Date Taking? Authorizing Provider  albuterol (PROVENTIL HFA;VENTOLIN HFA) 108 (90 Base) MCG/ACT inhaler Inhale 1-2 puffs into the lungs every 4 (four) hours as needed for wheezing or shortness of breath. 12/24/15  Yes Margarita Mail, PA-C  carisoprodol (SOMA) 350 MG tablet Take 1 tablet (350 mg total) by mouth 3 (three) times daily. 11/26/15  Yes Sanjuana Kava, MD  HYDROcodone-acetaminophen (NORCO) 7.5-325 MG tablet Take 1 tablet by mouth every 4 (four) hours as needed for moderate  pain (Must last 30 days.  Do not drive or operate machinery while taking this medicine.). 02/08/16  Yes Sanjuana Kava, MD  metoprolol tartrate (LOPRESSOR) 25 MG tablet Take 25 mg by mouth 2 (two) times daily.   Yes Historical Provider, MD  Multiple Vitamins-Minerals (MEGA MULTI MEN PO) Take 2 tablets by mouth daily.   Yes Historical Provider, MD  hydrOXYzine (ATARAX/VISTARIL) 25 MG tablet Take 1 tablet (25 mg total) by mouth every 6 (six) hours as needed for anxiety. 02/21/16   [name], MD    Family History Family History  Problem Relation Age of Onset  . Cancer Mother     Social History Social History  Substance Use Topics  . Smoking status: Current Some Day Smoker    Packs/day: 1.00    Years: 31.00    Types: Cigarettes  . Smokeless tobacco: Never Used  . Alcohol use No     Comment: used to/quit 2013     Allergies   Review of patient's allergies indicates no known allergies.   Review of Systems Review of Systems  Constitutional: Negative for chills and fever.       + generalized shaking  Respiratory: Negative for shortness of breath.   Cardiovascular: Negative for chest pain.  Psychiatric/Behavioral: The patient is nervous/anxious.   All other systems reviewed and are negative.    Physical Exam Updated Vital Signs BP 132/77 (BP Location: Left Arm)   Pulse 79  Temp 97.9 F (36.6 C) (Oral)   Resp 20   Ht 5\' 6"  (1.676 m)   Wt 160 lb (72.6 kg)   SpO2 100%   BMI 25.82 kg/m   Physical Exam  Constitutional: He is oriented to person, place, and time. He appears well-developed and well-nourished. No distress.  HENT:  Head: Normocephalic and atraumatic.  Right Ear: External ear normal.  Left Ear: External ear normal.  Eyes: Conjunctivae and EOM are normal. Pupils are equal, round, and reactive to light.  Neck: Normal range of motion and phonation normal. Neck supple.  Cardiovascular: Normal rate, regular rhythm and normal heart sounds.   Pulmonary/Chest:  Effort normal and breath sounds normal. He exhibits no bony tenderness.  Abdominal: Soft. He exhibits no mass. There is tenderness (mild diffuse).  Musculoskeletal: Normal range of motion.  Neurological: He is alert and oriented to person, place, and time. No cranial nerve deficit or sensory deficit. He exhibits normal muscle tone. Coordination normal.  Skin: Skin is warm, dry and intact.  Psychiatric: His mood appears anxious.  Nursing note and vitals reviewed.    ED Treatments / Results  DIAGNOSTIC STUDIES:  Oxygen Saturation is 100% on RA, normal by my interpretation.    COORDINATION OF CARE:  10:09 AM Discussed treatment plan with pt at bedside and pt agreed to plan.   Labs (all labs ordered are listed, but only abnormal results are displayed) Labs Reviewed  CBC WITH DIFFERENTIAL/PLATELET - Abnormal; Notable for the following:       Result Value   RBC 3.96 (*)    Hemoglobin 12.5 (*)    HCT 34.8 (*)    All other components within normal limits  URINE RAPID DRUG SCREEN, HOSP PERFORMED - Abnormal; Notable for the following:    Opiates POSITIVE (*)    Benzodiazepines POSITIVE (*)    All other components within normal limits  BASIC METABOLIC PANEL  URINALYSIS, ROUTINE W REFLEX MICROSCOPIC (NOT AT [facility])  ETHANOL    EKG  EKG Interpretation None       Radiology No results found.  Procedures Procedures (including critical care time)  Medications Ordered in ED Medications  hydrOXYzine (VISTARIL) injection 50 mg (50 mg Intramuscular Given 02/21/16 1145)     Initial Impression / Assessment and Plan / ED Course  I have reviewed the triage vital signs and the nursing notes.  Pertinent labs & imaging results that were available during my care of the patient were reviewed by me and considered in my medical decision making (see chart for details).  Clinical Course    Medications  hydrOXYzine (VISTARIL) injection 50 mg (50 mg Intramuscular Given 02/21/16 1145)     No data found.   At d/c Reevaluation with update and discussion. After initial assessment and treatment, an updated evaluation reveals calm after Vistaril. Mariaha Ellington L    Final Clinical Impressions(s) / ED Diagnoses   Final diagnoses:  Anxiety   Recurrent anxiety. Doubt unstable psychiatric condition  Nursing Notes Reviewed/ Care Coordinated Applicable Imaging Reviewed Interpretation of Laboratory Data incorporated into ED treatment  The patient appears reasonably screened and/or stabilized for discharge and I doubt any other medical condition or other [facility] requiring further screening, evaluation, or treatment in the ED at this time prior to discharge.  Plan: Home Medications- continue; Home Treatments- rest; return here if the recommended treatment, does not improve the symptoms; Recommended follow up- PCP prn   New Prescriptions Discharge Medication List as of 02/21/2016  1:28 PM  START taking these medications   Details  hydrOXYzine (ATARAX/VISTARIL) 25 MG tablet Take 1 tablet (25 mg total) by mouth every 6 (six) hours as needed for anxiety., Starting Sun 02/21/2016, Print        I personally performed the services described in this documentation, which was scribed in my presence. The recorded information has been reviewed and is accurate.     [name], MD 02/23/16 [phone]

## 2016-02-21 NOTE — ED Triage Notes (Signed)
EMS reports was called out for confusion.  Says pt was confused when they arrived and had difficulty answering questions initially.  Reports has been having "shakey" spells.  Also c/o abd bloating.  Denies any etoh or drug use.

## 2016-03-09 ENCOUNTER — Telehealth: Payer: Self-pay | Admitting: Orthopedic Surgery

## 2016-03-09 NOTE — Telephone Encounter (Signed)
Hydrocodone-Acetaminophen 7.5/325mg Qty 120 Tablets °

## 2016-03-10 MED ORDER — HYDROCODONE-ACETAMINOPHEN 5-325 MG PO TABS: 1.0000 | ORAL_TABLET | ORAL | 0 refills | Status: DC | PRN

## 2016-03-15 ENCOUNTER — Encounter: Payer: Self-pay | Admitting: Orthopedic Surgery

## 2016-03-15 ENCOUNTER — Ambulatory Visit (INDEPENDENT_AMBULATORY_CARE_PROVIDER_SITE_OTHER): Payer: BLUE CROSS/BLUE SHIELD | Admitting: Orthopedic Surgery

## 2016-03-15 VITALS — BP 127/72 | Ht 66.0 in | Wt 158.0 lb

## 2016-03-15 DIAGNOSIS — M5442 Lumbago with sciatica, left side: Secondary | ICD-10-CM

## 2016-03-15 DIAGNOSIS — M1990 Unspecified osteoarthritis, unspecified site: Secondary | ICD-10-CM

## 2016-03-15 DIAGNOSIS — M1612 Unilateral primary osteoarthritis, left hip: Secondary | ICD-10-CM

## 2016-03-15 MED ORDER — CARISOPRODOL 350 MG PO TABS: 350.0000 mg | ORAL_TABLET | Freq: Three times a day (TID) | ORAL | 3 refills | Status: DC

## 2016-03-15 NOTE — Progress Notes (Signed)
Patient ID: Kevin [name], male   DOB: 13-May-1966, 50 y.o.   MRN: [id number]  Chief Complaint  Patient presents with  . Follow-up    LEFT HIP ARTHRITIS    HPI Kevin [name] is a 50 y.o. male.  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 He wants to try to work in the summer but has found that to not be possible because of increasing pain   Review of Systems Review of Systems He does have left lower back pain with left leg radicular pain down the back of his leg which is separate from his groin and anterior thigh pain Denies fever   Past Medical History:  Diagnosis Date  . Arthritis   . Cancer ([place])   . Former consumption of alcohol    quit 2013  . Left renal mass 2012   renal cell carcinoma, completely excised  . Migraines      Examination BP 127/72   Ht 5\' 6"  (1.676 m)   Wt 158 lb (71.7 kg)   BMI 25.50 kg/m    Ortho Exam see previous note   Medical decision-making  Diagnosis  arthritis left hip Sciatica left leg  Data  Plan (risk)  Prescriptions were written and the patient is referred to another doctor for anterior total hip   Kevin Abbott, MD 03/15/2016 10:56 AM

## 2016-03-15 NOTE — Patient Instructions (Signed)
[facility], 9:45AM [address] [place], [place] [address] [phone]

## 2016-03-31 ENCOUNTER — Ambulatory Visit: Payer: BLUE CROSS/BLUE SHIELD | Admitting: Orthopedic Surgery

## 2016-04-07 ENCOUNTER — Telehealth: Payer: Self-pay | Admitting: Orthopaedic Surgery

## 2016-04-07 MED ORDER — HYDROCODONE-ACETAMINOPHEN 5-325 MG PO TABS: 1.0000 | ORAL_TABLET | Freq: Four times a day (QID) | ORAL | 0 refills | Status: DC | PRN

## 2016-04-07 NOTE — Telephone Encounter (Signed)
Patient requests refill: HYDROcodone-acetaminophen (NORCO/VICODIN) 5-325 MG tablet/quantity 120.

## 2016-04-08 ENCOUNTER — Encounter: Payer: Self-pay | Admitting: Orthopaedic Surgery

## 2016-04-18 NOTE — Progress Notes (Signed)
Pt is being scheduled for preop appt; please place surgical orders in epic. Thanks.  

## 2016-04-19 ENCOUNTER — Other Ambulatory Visit: Payer: Self-pay | Admitting: Orthopaedic Surgery

## 2016-04-25 ENCOUNTER — Encounter (HOSPITAL_COMMUNITY)
Admission: RE | Admit: 2016-04-25 | Discharge: 2016-04-25 | Disposition: A | Discharge status: Home / Self Care | Payer: BLUE CROSS/BLUE SHIELD | Source: Ambulatory Visit | Attending: Orthopaedic Surgery | Admitting: Orthopaedic Surgery

## 2016-04-25 ENCOUNTER — Encounter (HOSPITAL_COMMUNITY): Payer: Self-pay

## 2016-04-25 DIAGNOSIS — I10 Essential (primary) hypertension: Secondary | ICD-10-CM | POA: Diagnosis present

## 2016-04-25 HISTORY — DX: Insomnia, unspecified: G47.00

## 2016-04-25 HISTORY — DX: Anxiety disorder, unspecified: F41.9

## 2016-04-25 HISTORY — DX: Essential (primary) hypertension: I10

## 2016-04-25 HISTORY — DX: Depression, unspecified: F32.A

## 2016-04-25 HISTORY — DX: Major depressive disorder, single episode, unspecified: F32.9

## 2016-04-25 LAB — CBC
HEMATOCRIT: 39 % (ref 39.0–52.0)
HEMOGLOBIN: 13.9 g/dL (ref 13.0–17.0)
MCH: 31.9 pg (ref 26.0–34.0)
MCHC: 35.6 g/dL (ref 30.0–36.0)
MCV: 89.4 fL (ref 78.0–100.0)
Platelets: 183 10*3/uL (ref 150–400)
RBC: 4.36 MIL/uL (ref 4.22–5.81)
RDW: 12.2 % (ref 11.5–15.5)
WBC: 7.5 10*3/uL (ref 4.0–10.5)

## 2016-04-25 LAB — SURGICAL PCR SCREEN
MRSA, PCR: NEGATIVE
Staphylococcus aureus: NEGATIVE

## 2016-04-25 LAB — BASIC METABOLIC PANEL
ANION GAP: 5 (ref 5–15)
BUN: 11 mg/dL (ref 6–20)
CHLORIDE: 107 mmol/L (ref 101–111)
CO2: 29 mmol/L (ref 22–32)
Calcium: 9.8 mg/dL (ref 8.9–10.3)
Creatinine, Ser: 0.97 mg/dL (ref 0.61–1.24)
GFR calc Af Amer: >60 mL/min (ref >60)
GFR calc non Af Amer: >60 mL/min (ref >60)
Glucose, Bld: 93 mg/dL (ref 65–99)
POTASSIUM: 4.9 mmol/L (ref 3.5–5.1)
SODIUM: 141 mmol/L (ref 135–145)

## 2016-04-25 LAB — PROTIME-INR
INR: 0.98
PROTHROMBIN TIME: 13 s (ref 11.4–15.2)

## 2016-04-25 NOTE — Patient Instructions (Addendum)
Kevin [name]  04/25/2016   Your procedure is scheduled on: Friday April 29, 2016  Report to [facility]  Entrance take [place]  elevators to 3rd floor to  [place] at 11:45 AM.  Call this number if you have problems the morning of surgery [phone]   Remember: ONLY 1 PERSON MAY GO WITH YOU TO SHORT STAY TO GET  READY MORNING OF [place].  Do not eat food After Midnight but may take clear liquids till 7:45 am day of surgery then nothing by mouth.      Take these medicines the morning of surgery with A SIP OF WATER: Hydrocodone - Acetaminophen if needed; Metoprolol; Sertraline (Zoloft); Lorazepam (Ativan) if needed                                 You may not have any metal on your body including hair pins and              piercings  Do not wear jewelry,  lotions, powders or colognes, deodorant                         Men may shave face and neck.   Do not bring valuables to the hospital. [place].  Contacts, dentures or bridgework may not be worn into surgery.  Leave suitcase in the car. After surgery it may be brought to your room.                 CLEAR LIQUID DIET   Foods Allowed                                                                     Foods Excluded  Coffee and tea, regular and decaf                             liquids that you cannot  Plain Jell-O in any flavor                                             see through such as: Fruit ices (not with fruit pulp)                                     milk, soups, orange juice  Iced Popsicles                                    All solid food Carbonated beverages, regular and diet  Cranberry, grape and apple juices Sports drinks like Gatorade Lightly seasoned clear broth or consume(fat free) Sugar, honey syrup  Sample Menu Breakfast                                Lunch                                      Supper Cranberry juice                    Beef broth                            Chicken broth Jell-O                                     Grape juice                           Apple juice Coffee or tea                        Jell-O                                      Popsicle                                                Coffee or tea                        Coffee or tea  _____________________________________________________________________   _____________________________________________________________________             [facility] - Preparing for Surgery Before surgery, you can play an important role.  Because skin is not sterile, your skin needs to be as free of germs as possible.  You can reduce the number of germs on your skin by washing with CHG (chlorahexidine gluconate) soap before surgery.  CHG is an antiseptic cleaner which kills germs and bonds with the skin to continue killing germs even after washing. Please DO NOT use if you have an allergy to CHG or antibacterial soaps.  If your skin becomes reddened/irritated stop using the CHG and inform your nurse when you arrive at Short Stay. Do not shave (including legs and underarms) for at least 48 hours prior to the first CHG shower.  You may shave your face/neck. Please follow these instructions carefully:  1.  Shower with CHG Soap the night before surgery and the  morning of Surgery.  2.  If you choose to wash your hair, wash your hair first as usual with your  normal  shampoo.  3.  After you shampoo, rinse your hair and body thoroughly to remove the  shampoo.                           4.  Use CHG as you would any other liquid soap.  You  can apply chg directly  to the skin and wash                       Gently with a scrungie or clean washcloth.  5.  Apply the CHG Soap to your body ONLY FROM THE NECK DOWN.   Do not use on face/ open                           Wound or open sores. Avoid contact with eyes, ears mouth  and genitals (private parts).                       Wash face,  Genitals (private parts) with your normal soap.             6.  Wash thoroughly, paying special attention to the area where your surgery  will be performed.  7.  Thoroughly rinse your body with warm water from the neck down.  8.  DO NOT shower/wash with your normal soap after using and rinsing off  the CHG Soap.                9.  Pat yourself dry with a clean towel.            10.  Wear clean pajamas.            11.  Place clean sheets on your bed the night of your first shower and do not  sleep with pets. Day of Surgery : Do not apply any lotions/deodorants the morning of surgery.  Please wear clean clothes to the hospital/surgery center.  FAILURE TO FOLLOW THESE INSTRUCTIONS MAY RESULT IN THE CANCELLATION OF YOUR SURGERY PATIENT SIGNATURE_________________________________  NURSE SIGNATURE__________________________________  ________________________________________________________________________

## 2016-04-25 NOTE — Progress Notes (Addendum)
Your patient has screened at an elevated risk for Obstructive Sleep Apnea using the Stop-Bang Tool during a pre-surgical visit. Patient scored at high risk. (pt currently has no PCP)

## 2016-04-29 ENCOUNTER — Inpatient Hospital Stay (HOSPITAL_COMMUNITY): Payer: BLUE CROSS/BLUE SHIELD | Admitting: Certified Registered Nurse Anesthetist

## 2016-04-29 ENCOUNTER — Inpatient Hospital Stay (HOSPITAL_COMMUNITY): Payer: BLUE CROSS/BLUE SHIELD

## 2016-04-29 ENCOUNTER — Inpatient Hospital Stay (HOSPITAL_COMMUNITY)
Admission: RE | Admit: 2016-04-29 | Discharge: 2016-05-02 | DRG: 470 | Disposition: A | Discharge status: Home Health | Payer: BLUE CROSS/BLUE SHIELD | Source: Ambulatory Visit | Attending: Orthopaedic Surgery | Admitting: Orthopaedic Surgery

## 2016-04-29 ENCOUNTER — Encounter (HOSPITAL_COMMUNITY): Payer: Self-pay | Admitting: *Deleted

## 2016-04-29 ENCOUNTER — Encounter (HOSPITAL_COMMUNITY)
Admission: RE | Disposition: A | Discharge status: Home Health | Payer: Self-pay | Source: Ambulatory Visit | Attending: Orthopaedic Surgery

## 2016-04-29 DIAGNOSIS — Z419 Encounter for procedure for purposes other than remedying health state, unspecified: Secondary | ICD-10-CM

## 2016-04-29 DIAGNOSIS — Z79899 Other long term (current) drug therapy: Secondary | ICD-10-CM | POA: Diagnosis not present

## 2016-04-29 DIAGNOSIS — F17290 Nicotine dependence, other tobacco product, uncomplicated: Secondary | ICD-10-CM | POA: Diagnosis present

## 2016-04-29 DIAGNOSIS — Z905 Acquired absence of kidney: Secondary | ICD-10-CM

## 2016-04-29 DIAGNOSIS — Z85528 Personal history of other malignant neoplasm of kidney: Secondary | ICD-10-CM

## 2016-04-29 DIAGNOSIS — M1612 Unilateral primary osteoarthritis, left hip: Principal | ICD-10-CM

## 2016-04-29 DIAGNOSIS — Z96642 Presence of left artificial hip joint: Secondary | ICD-10-CM

## 2016-04-29 DIAGNOSIS — I10 Essential (primary) hypertension: Secondary | ICD-10-CM | POA: Diagnosis present

## 2016-04-29 HISTORY — PX: TOTAL HIP ARTHROPLASTY: SHX124

## 2016-04-29 LAB — TYPE AND SCREEN
ABO/RH(D): A POS
ANTIBODY SCREEN: NEGATIVE

## 2016-04-29 SURGERY — ARTHROPLASTY, HIP, TOTAL, ANTERIOR APPROACH
Anesthesia: General | Site: Hip | Laterality: Left

## 2016-04-29 MED ORDER — ONDANSETRON HCL 4 MG/2ML IJ SOLN
INTRAMUSCULAR | Status: AC
  Filled 2016-04-29: qty 2

## 2016-04-29 MED ORDER — DOCUSATE SODIUM 100 MG PO CAPS
100.0000 mg | ORAL_CAPSULE | Freq: Two times a day (BID) | ORAL | Status: DC
  Administered 2016-04-29 – 2016-05-02 (×6): 100 mg via ORAL
  Filled 2016-04-29 (×6): qty 1

## 2016-04-29 MED ORDER — SODIUM CHLORIDE 0.9 % IR SOLN
Status: DC | PRN
  Administered 2016-04-29: 1000 mL

## 2016-04-29 MED ORDER — ASPIRIN 81 MG PO CHEW
81.0000 mg | CHEWABLE_TABLET | Freq: Two times a day (BID) | ORAL | Status: DC
  Administered 2016-04-29 – 2016-05-02 (×6): 81 mg via ORAL
  Filled 2016-04-29 (×6): qty 1

## 2016-04-29 MED ORDER — CEFAZOLIN SODIUM-DEXTROSE 2-4 GM/100ML-% IV SOLN
INTRAVENOUS | Status: AC
  Filled 2016-04-29: qty 100

## 2016-04-29 MED ORDER — HYDROMORPHONE HCL 1 MG/ML IJ SOLN
INTRAMUSCULAR | Status: AC
  Administered 2016-04-29: 0.5 mg via INTRAVENOUS
  Filled 2016-04-29: qty 1

## 2016-04-29 MED ORDER — SUCCINYLCHOLINE CHLORIDE 200 MG/10ML IV SOSY
PREFILLED_SYRINGE | INTRAVENOUS | Status: DC | PRN
  Administered 2016-04-29: 100 mg via INTRAVENOUS

## 2016-04-29 MED ORDER — PROPOFOL 10 MG/ML IV BOLUS
INTRAVENOUS | Status: DC | PRN
  Administered 2016-04-29: 100 mg via INTRAVENOUS
  Administered 2016-04-29: 200 mg via INTRAVENOUS

## 2016-04-29 MED ORDER — MIDAZOLAM HCL 5 MG/5ML IJ SOLN
INTRAMUSCULAR | Status: DC | PRN
  Administered 2016-04-29: 2 mg via INTRAVENOUS

## 2016-04-29 MED ORDER — LACTATED RINGERS IV SOLN: INTRAVENOUS | Status: DC

## 2016-04-29 MED ORDER — HYDROMORPHONE HCL 2 MG/ML IJ SOLN
INTRAMUSCULAR | Status: AC
  Filled 2016-04-29: qty 1

## 2016-04-29 MED ORDER — DIPHENHYDRAMINE HCL 12.5 MG/5ML PO ELIX: 12.5000 mg | ORAL_SOLUTION | ORAL | Status: DC | PRN

## 2016-04-29 MED ORDER — PROPOFOL 10 MG/ML IV BOLUS
INTRAVENOUS | Status: AC
  Filled 2016-04-29: qty 20

## 2016-04-29 MED ORDER — SERTRALINE HCL 50 MG PO TABS
50.0000 mg | ORAL_TABLET | Freq: Every day | ORAL | Status: DC
  Administered 2016-04-30 – 2016-05-02 (×3): 50 mg via ORAL
  Filled 2016-04-29 (×3): qty 1

## 2016-04-29 MED ORDER — METOPROLOL TARTRATE 25 MG PO TABS
25.0000 mg | ORAL_TABLET | Freq: Two times a day (BID) | ORAL | Status: DC
  Administered 2016-04-29 – 2016-05-02 (×6): 25 mg via ORAL
  Filled 2016-04-29 (×6): qty 1

## 2016-04-29 MED ORDER — CARISOPRODOL 350 MG PO TABS
350.0000 mg | ORAL_TABLET | Freq: Three times a day (TID) | ORAL | Status: DC
  Administered 2016-04-29 – 2016-05-02 (×9): 350 mg via ORAL
  Filled 2016-04-29 (×10): qty 1

## 2016-04-29 MED ORDER — FENTANYL CITRATE (PF) 100 MCG/2ML IJ SOLN
INTRAMUSCULAR | Status: AC
  Filled 2016-04-29: qty 2

## 2016-04-29 MED ORDER — METOCLOPRAMIDE HCL 5 MG PO TABS: 5.0000 mg | ORAL_TABLET | Freq: Three times a day (TID) | ORAL | Status: DC | PRN

## 2016-04-29 MED ORDER — HYDROMORPHONE HCL 1 MG/ML IJ SOLN
1.0000 mg | INTRAMUSCULAR | Status: DC | PRN
  Administered 2016-04-29 – 2016-05-02 (×17): 1 mg via INTRAVENOUS
  Filled 2016-04-29 (×17): qty 1

## 2016-04-29 MED ORDER — CEFAZOLIN SODIUM-DEXTROSE 2-4 GM/100ML-% IV SOLN
2.0000 g | INTRAVENOUS | Status: AC
  Administered 2016-04-29: 2 g via INTRAVENOUS

## 2016-04-29 MED ORDER — SODIUM CHLORIDE 0.9 % IV SOLN
INTRAVENOUS | Status: DC
  Administered 2016-04-29 – 2016-04-30 (×2): via INTRAVENOUS

## 2016-04-29 MED ORDER — HYDROMORPHONE HCL 1 MG/ML IJ SOLN
INTRAMUSCULAR | Status: DC | PRN
  Administered 2016-04-29 (×2): 1 mg via INTRAVENOUS

## 2016-04-29 MED ORDER — HYDROMORPHONE HCL 1 MG/ML IJ SOLN
0.2500 mg | INTRAMUSCULAR | Status: DC | PRN
  Administered 2016-04-29 (×4): 0.5 mg via INTRAVENOUS

## 2016-04-29 MED ORDER — POLYETHYLENE GLYCOL 3350 17 G PO PACK: 17.0000 g | PACK | Freq: Every day | ORAL | Status: DC | PRN

## 2016-04-29 MED ORDER — KETOROLAC TROMETHAMINE 15 MG/ML IJ SOLN
INTRAMUSCULAR | Status: AC
  Filled 2016-04-29: qty 1

## 2016-04-29 MED ORDER — FENTANYL CITRATE (PF) 100 MCG/2ML IJ SOLN
INTRAMUSCULAR | Status: DC | PRN
  Administered 2016-04-29: 50 ug via INTRAVENOUS
  Administered 2016-04-29: 100 ug via INTRAVENOUS
  Administered 2016-04-29: 50 ug via INTRAVENOUS

## 2016-04-29 MED ORDER — DEXAMETHASONE SODIUM PHOSPHATE 10 MG/ML IJ SOLN
INTRAMUSCULAR | Status: AC
  Filled 2016-04-29: qty 1

## 2016-04-29 MED ORDER — MENTHOL 3 MG MT LOZG: 1.0000 | LOZENGE | OROMUCOSAL | Status: DC | PRN

## 2016-04-29 MED ORDER — MEPERIDINE HCL 50 MG/ML IJ SOLN: 6.2500 mg | INTRAMUSCULAR | Status: DC | PRN

## 2016-04-29 MED ORDER — OXYCODONE HCL 5 MG/5ML PO SOLN
5.0000 mg | Freq: Once | ORAL | Status: DC | PRN
  Filled 2016-04-29: qty 5

## 2016-04-29 MED ORDER — ACETAMINOPHEN 325 MG PO TABS
650.0000 mg | ORAL_TABLET | Freq: Four times a day (QID) | ORAL | Status: DC | PRN
  Administered 2016-04-29 – 2016-05-01 (×3): 650 mg via ORAL
  Filled 2016-04-29 (×3): qty 2

## 2016-04-29 MED ORDER — METHOCARBAMOL 1000 MG/10ML IJ SOLN
500.0000 mg | Freq: Once | INTRAVENOUS | Status: AC
  Administered 2016-04-29: 500 mg via INTRAVENOUS
  Filled 2016-04-29: qty 550

## 2016-04-29 MED ORDER — ONDANSETRON HCL 4 MG/2ML IJ SOLN
INTRAMUSCULAR | Status: DC | PRN
  Administered 2016-04-29: 4 mg via INTRAVENOUS

## 2016-04-29 MED ORDER — EPHEDRINE SULFATE-NACL 50-0.9 MG/10ML-% IV SOSY
PREFILLED_SYRINGE | INTRAVENOUS | Status: DC | PRN
  Administered 2016-04-29 (×2): 10 mg via INTRAVENOUS

## 2016-04-29 MED ORDER — ACETAMINOPHEN 650 MG RE SUPP: 650.0000 mg | Freq: Four times a day (QID) | RECTAL | Status: DC | PRN

## 2016-04-29 MED ORDER — OXYCODONE HCL 5 MG PO TABS: 5.0000 mg | ORAL_TABLET | Freq: Once | ORAL | Status: DC | PRN

## 2016-04-29 MED ORDER — MIDAZOLAM HCL 2 MG/2ML IJ SOLN
INTRAMUSCULAR | Status: AC
  Filled 2016-04-29: qty 2

## 2016-04-29 MED ORDER — HYDROMORPHONE HCL 1 MG/ML IJ SOLN
0.5000 mg | INTRAMUSCULAR | Status: AC | PRN
  Administered 2016-04-29 (×8): 0.5 mg via INTRAVENOUS

## 2016-04-29 MED ORDER — ONDANSETRON HCL 4 MG/2ML IJ SOLN: 4.0000 mg | Freq: Four times a day (QID) | INTRAMUSCULAR | Status: DC | PRN

## 2016-04-29 MED ORDER — OXYCODONE HCL 5 MG PO TABS
5.0000 mg | ORAL_TABLET | ORAL | Status: DC | PRN
  Administered 2016-04-29: 15 mg via ORAL
  Administered 2016-04-29: 10 mg via ORAL
  Administered 2016-04-29: 5 mg via ORAL
  Administered 2016-04-30: 10 mg via ORAL
  Administered 2016-04-30 (×4): 15 mg via ORAL
  Administered 2016-04-30: 10 mg via ORAL
  Administered 2016-05-01 – 2016-05-02 (×10): 15 mg via ORAL
  Filled 2016-04-29 (×4): qty 3
  Filled 2016-04-29 (×2): qty 2
  Filled 2016-04-29 (×2): qty 3
  Filled 2016-04-29: qty 1
  Filled 2016-04-29 (×7): qty 3
  Filled 2016-04-29: qty 2
  Filled 2016-04-29 (×2): qty 3

## 2016-04-29 MED ORDER — ONDANSETRON HCL 4 MG PO TABS: 4.0000 mg | ORAL_TABLET | Freq: Four times a day (QID) | ORAL | Status: DC | PRN

## 2016-04-29 MED ORDER — DEXAMETHASONE SODIUM PHOSPHATE 10 MG/ML IJ SOLN
INTRAMUSCULAR | Status: DC | PRN
Start: 2016-04-29 — End: 2016-04-29
  Administered 2016-04-29: 10 mg via INTRAVENOUS

## 2016-04-29 MED ORDER — LORAZEPAM 0.5 MG PO TABS
0.5000 mg | ORAL_TABLET | Freq: Three times a day (TID) | ORAL | Status: DC
  Administered 2016-04-29 – 2016-05-02 (×9): 0.5 mg via ORAL
  Filled 2016-04-29 (×10): qty 1

## 2016-04-29 MED ORDER — ALUM & MAG HYDROXIDE-SIMETH 200-200-20 MG/5ML PO SUSP: 30.0000 mL | ORAL | Status: DC | PRN

## 2016-04-29 MED ORDER — TRANEXAMIC ACID 1000 MG/10ML IV SOLN
1000.0000 mg | INTRAVENOUS | Status: AC
  Administered 2016-04-29: 1000 mg via INTRAVENOUS
  Filled 2016-04-29: qty 1100

## 2016-04-29 MED ORDER — METOCLOPRAMIDE HCL 5 MG/ML IJ SOLN: 5.0000 mg | Freq: Three times a day (TID) | INTRAMUSCULAR | Status: DC | PRN

## 2016-04-29 MED ORDER — LIDOCAINE 2% (20 MG/ML) 5 ML SYRINGE
INTRAMUSCULAR | Status: DC | PRN
  Administered 2016-04-29: 20 mg via INTRAVENOUS

## 2016-04-29 MED ORDER — KETOROLAC TROMETHAMINE 15 MG/ML IJ SOLN
7.5000 mg | Freq: Four times a day (QID) | INTRAMUSCULAR | Status: AC
  Administered 2016-04-29 – 2016-04-30 (×4): 7.5 mg via INTRAVENOUS
  Filled 2016-04-29 (×4): qty 1

## 2016-04-29 MED ORDER — LACTATED RINGERS IV SOLN
INTRAVENOUS | Status: DC | PRN
  Administered 2016-04-29 (×2): via INTRAVENOUS

## 2016-04-29 MED ORDER — PHENOL 1.4 % MT LIQD: 1.0000 | OROMUCOSAL | Status: DC | PRN

## 2016-04-29 MED ORDER — 0.9 % SODIUM CHLORIDE (POUR BTL) OPTIME
TOPICAL | Status: DC | PRN
  Administered 2016-04-29: 1000 mL

## 2016-04-29 MED ORDER — EPHEDRINE 5 MG/ML INJ
INTRAVENOUS | Status: AC
  Filled 2016-04-29: qty 10

## 2016-04-29 MED ORDER — ONDANSETRON HCL 4 MG/2ML IJ SOLN: 4.0000 mg | Freq: Once | INTRAMUSCULAR | Status: DC | PRN

## 2016-04-29 MED ORDER — ZOLPIDEM TARTRATE 5 MG PO TABS: 5.0000 mg | ORAL_TABLET | Freq: Every evening | ORAL | Status: DC | PRN

## 2016-04-29 MED ORDER — LIDOCAINE 2% (20 MG/ML) 5 ML SYRINGE
INTRAMUSCULAR | Status: AC
  Filled 2016-04-29: qty 5

## 2016-04-29 SURGICAL SUPPLY — 31 items
BENZOIN TINCTURE PRP APPL 2/3 (GAUZE/BANDAGES/DRESSINGS) ×3 IMPLANT
BLADE SAW SGTL 18X1.27X75 (BLADE) ×2 IMPLANT
BLADE SAW SGTL 18X1.27X75MM (BLADE) ×1
CAPT HIP TOTAL 2 ×3 IMPLANT
CELLS DAT CNTRL 66122 CELL SVR (MISCELLANEOUS) ×1 IMPLANT
CLOSURE WOUND 1/2 X4 (GAUZE/BANDAGES/DRESSINGS) ×1
CLOTH BEACON ORANGE TIMEOUT ST (SAFETY) ×3 IMPLANT
DRAPE STERI IOBAN 125X83 (DRAPES) ×3 IMPLANT
DRAPE U-SHAPE 47X51 STRL (DRAPES) ×6 IMPLANT
DRSG AQUACEL AG ADV 3.5X10 (GAUZE/BANDAGES/DRESSINGS) ×3 IMPLANT
DURAPREP 26ML APPLICATOR (WOUND CARE) ×3 IMPLANT
ELECT REM PT RETURN 9FT ADLT (ELECTROSURGICAL) ×3
ELECTRODE REM PT RTRN 9FT ADLT (ELECTROSURGICAL) ×1 IMPLANT
GLOVE BIO SURGEON STRL SZ7.5 (GLOVE) ×3 IMPLANT
GLOVE BIOGEL PI IND STRL 8 (GLOVE) ×2 IMPLANT
GLOVE BIOGEL PI INDICATOR 8 (GLOVE) ×4
GLOVE ECLIPSE 8.0 STRL XLNG CF (GLOVE) ×3 IMPLANT
GOWN STRL REUS W/TWL XL LVL3 (GOWN DISPOSABLE) ×6 IMPLANT
HANDPIECE INTERPULSE COAX TIP (DISPOSABLE) ×3
HOLDER FOLEY CATH W/STRAP (MISCELLANEOUS) ×3 IMPLANT
PACK ANTERIOR HIP CUSTOM (KITS) ×3 IMPLANT
RTRCTR WOUND ALEXIS 18CM MED (MISCELLANEOUS) ×3
SET HNDPC FAN SPRY TIP SCT (DISPOSABLE) ×1 IMPLANT
STRIP CLOSURE SKIN 1/2X4 (GAUZE/BANDAGES/DRESSINGS) ×2 IMPLANT
SUT ETHIBOND NAB CT1 #1 30IN (SUTURE) ×3 IMPLANT
SUT MNCRL AB 4-0 PS2 18 (SUTURE) ×3 IMPLANT
SUT VIC AB 0 CT1 36 (SUTURE) ×3 IMPLANT
SUT VIC AB 1 CT1 36 (SUTURE) ×3 IMPLANT
SUT VIC AB 2-0 CT1 27 (SUTURE) ×6
SUT VIC AB 2-0 CT1 TAPERPNT 27 (SUTURE) ×2 IMPLANT
YANKAUER SUCT BULB TIP NO VENT (SUCTIONS) ×3 IMPLANT

## 2016-04-29 NOTE — Anesthesia Preprocedure Evaluation (Signed)
Anesthesia Evaluation  Patient identified by MRN, date of birth, ID band Patient awake    Reviewed: Allergy & Precautions, NPO status , Patient's Chart, lab work & pertinent test results, reviewed documented beta blocker date and time   Airway Mallampati: I  TM Distance: >3 FB Neck ROM: Full    Dental  (+) Teeth Intact, Dental Advisory Given   Pulmonary Current Smoker,    breath sounds clear to auscultation       Cardiovascular hypertension, Pt. on medications and Pt. on home beta blockers  Rhythm:Regular Rate:Normal     Neuro/Psych    GI/Hepatic   Endo/Other    Renal/GU      Musculoskeletal   Abdominal   Peds  Hematology   Anesthesia Other Findings   Reproductive/Obstetrics                             Anesthesia Physical Anesthesia Plan  ASA: I  Anesthesia Plan: General   Post-op Pain Management:    Induction: Intravenous  Airway Management Planned: Oral ETT  Additional Equipment:   Intra-op Plan:   Post-operative Plan: Extubation in OR  Informed Consent: I have reviewed the patients History and Physical, chart, labs and discussed the procedure including the risks, benefits and alternatives for the proposed anesthesia with the patient or authorized representative who has indicated his/her understanding and acceptance.   Dental advisory given  Plan Discussed with: CRNA, Anesthesiologist and Surgeon  Anesthesia Plan Comments:         Anesthesia Quick Evaluation

## 2016-04-29 NOTE — Brief Op Note (Signed)
04/29/2016  3:04 PM  PATIENT:  Kevin [name]  50 y.o. male  PRE-OPERATIVE DIAGNOSIS:  osteoarthritis left hip  POST-OPERATIVE DIAGNOSIS:  osteoarthritis left hip  PROCEDURE:  Procedure(s): LEFT TOTAL HIP ARTHROPLASTY ANTERIOR APPROACH (Left)  SURGEON:  Surgeon(s) and Role:    * Mcarthur Rossetti, MD - Primary  PHYSICIAN ASSISTANT: Benita Stabile, PA-C  ANESTHESIA:   general  EBL:  Total I/O In: 1000 [I.V.:1000] Out: 400 [Blood:400]  COUNTS:  YES  DICTATION: .Other Dictation: Dictation Number [id number]  PLAN OF CARE: Admit to inpatient   PATIENT DISPOSITION:  PACU - hemodynamically stable.   Delay start of Pharmacological VTE agent (>24hrs) due to surgical blood loss or risk of bleeding: no

## 2016-04-29 NOTE — Transfer of Care (Signed)
Immediate Anesthesia Transfer of Care Note  Patient: Kevin [name]  Procedure(s) Performed: Procedure(s): LEFT TOTAL HIP ARTHROPLASTY ANTERIOR APPROACH (Left)  Patient Location: PACU  Anesthesia Type:General  Level of Consciousness:  sedated, patient cooperative and responds to stimulation  Airway & Oxygen Therapy:Patient Spontanous Breathing and Patient connected to face mask oxgen  Post-op Assessment:  Report given to PACU RN and Post -op Vital signs reviewed and stable  Post vital signs:  Reviewed and stable  Last Vitals:  Vitals:   04/29/16 1134 04/29/16 1519  BP: 122/62   Pulse: 74 (!) (P) 107  Resp: 16 (P) 14  Temp: 36.7 C (P) 123XX123 C    Complications: No apparent anesthesia complications

## 2016-04-29 NOTE — H&P (Signed)
TOTAL HIP ADMISSION H&P  Patient is admitted for left total hip arthroplasty.  Subjective:  Chief Complaint: left hip pain  HPI: Kevin [name], 50 y.o. male, has a history of pain and functional disability in the left hip(s) due to arthritis and patient has failed non-surgical conservative treatments for greater than 12 weeks to include NSAID's and/or analgesics, flexibility and strengthening excercises and activity modification.  Onset of symptoms was gradual starting 1 years ago with rapidlly worsening course since that time.The patient noted no past surgery on the left hip(s).  Patient currently rates pain in the left hip at 10 out of 10 with activity. Patient has night pain, worsening of pain with activity and weight bearing, pain that interfers with activities of daily living and pain with passive range of motion. Patient has evidence of subchondral sclerosis, periarticular osteophytes and joint space narrowing by imaging studies. This condition presents safety issues increasing the risk of falls.  There is no current active infection.  Patient Active Problem List   Diagnosis Date Noted  . Osteoarthritis of left hip 04/29/2016   Past Medical History:  Diagnosis Date  . Anxiety   . Arthritis   . Cancer ([place])   . Depression   . Former consumption of alcohol    quit 2013  . Hypertension   . Insomnia   . Left renal mass 2012   renal cell carcinoma, completely excised  . Migraines     Past Surgical History:  Procedure Laterality Date  . MANDIBLE FRACTURE SURGERY  age 66  . ROBOTIC ASSITED PARTIAL NEPHRECTOMY Left 2012    No prescriptions prior to admission.   No Known Allergies  Social History  Substance Use Topics  . Smoking status: Current Every Day Smoker    Packs/day: 0.25    Years: 30.00    Types: Cigarettes, E-cigarettes  . Smokeless tobacco: Never Used  . Alcohol use No     Comment: used to/quit 2013    Family History  Problem Relation Age of Onset  . Cancer  Mother      Review of Systems  Musculoskeletal: Positive for joint pain.  All other systems reviewed and are negative.   Objective:  Physical Exam  Constitutional: He is oriented to person, place, and time. He appears well-developed and well-nourished.  HENT:  Head: Normocephalic and atraumatic.  Eyes: EOM are normal. Pupils are equal, round, and reactive to light.  Neck: Normal range of motion. Neck supple.  Cardiovascular: Normal rate and regular rhythm.   Respiratory: Effort normal and breath sounds normal.  GI: Soft. Bowel sounds are normal.  Musculoskeletal:       Left hip: He exhibits decreased range of motion, decreased strength, tenderness and bony tenderness.  Neurological: He is alert and oriented to person, place, and time.  Skin: Skin is warm and dry.  Psychiatric: He has a normal mood and affect.    Vital signs in last 24 hours:    Labs:   Estimated body mass index is 25.24 kg/m as calculated from the following:   Height as of 04/25/16: 5\' 6"  (1.676 m).   Weight as of 04/25/16: 70.9 kg (156 lb 6 oz).   Imaging Review Plain radiographs demonstrate moderate degenerative joint disease of the left hip(s). The bone quality appears to be excellent for age and reported activity level.  Assessment/Plan:  End stage arthritis, left hip(s)  The patient history, physical examination, clinical judgement of the provider and imaging studies are consistent with end stage  degenerative joint disease of the left hip(s) and total hip arthroplasty is deemed medically necessary. The treatment options including medical management, injection therapy, arthroscopy and arthroplasty were discussed at length. The risks and benefits of total hip arthroplasty were presented and reviewed. The risks due to aseptic loosening, infection, stiffness, dislocation/subluxation,  thromboembolic complications and other imponderables were discussed.  The patient acknowledged the explanation, agreed to  proceed with the plan and consent was signed. Patient is being admitted for inpatient treatment for surgery, pain control, PT, OT, prophylactic antibiotics, VTE prophylaxis, progressive ambulation and ADL's and discharge planning.The patient is planning to be discharged home with home health services

## 2016-04-29 NOTE — Anesthesia Postprocedure Evaluation (Signed)
Anesthesia Post Note  Patient: Kevin [name]  Procedure(s) Performed: Procedure(s) (LRB): LEFT TOTAL HIP ARTHROPLASTY ANTERIOR APPROACH (Left)  Patient location during evaluation: PACU Anesthesia Type: General Level of consciousness: awake and alert Pain management: pain level controlled Vital Signs Assessment: post-procedure vital signs reviewed and stable Respiratory status: spontaneous breathing, nonlabored ventilation, respiratory function stable and patient connected to nasal cannula oxygen Cardiovascular status: blood pressure returned to baseline and stable Postop Assessment: no signs of nausea or vomiting Anesthetic complications: no    Last Vitals:  Vitals:   04/29/16 1643 04/29/16 1700  BP: 139/82 (!) 138/92  Pulse: (!) 107 (!) 107  Resp: 16 16  Temp: 36.8 C 37 C    Last Pain:  Vitals:   04/29/16 1700  TempSrc:   PainSc: 5                  Willian Donson A

## 2016-04-29 NOTE — Anesthesia Procedure Notes (Signed)
Procedure Name: Intubation Date/Time: 04/29/2016 1:43 PM Performed by: Lajuana Carry E Pre-anesthesia Checklist: Patient identified, Emergency Drugs available, Suction available and Patient being monitored Patient Re-evaluated:Patient Re-evaluated prior to inductionOxygen Delivery Method: Circle system utilized Preoxygenation: Pre-oxygenation with 100% oxygen Intubation Type: IV induction Ventilation: Mask ventilation without difficulty Laryngoscope Size: Miller and 2 Grade View: Grade II Tube type: Oral Tube size: 7.5 mm Number of attempts: 1 Airway Equipment and Method: Stylet Placement Confirmation: ETT inserted through vocal cords under direct vision,  positive ETCO2 and breath sounds checked- equal and bilateral Secured at: 21 cm Tube secured with: Tape Dental Injury: Teeth and Oropharynx as per pre-operative assessment

## 2016-04-30 LAB — BASIC METABOLIC PANEL
Anion gap: 5 (ref 5–15)
BUN: 11 mg/dL (ref 6–20)
CALCIUM: 8.8 mg/dL — AB (ref 8.9–10.3)
CO2: 29 mmol/L (ref 22–32)
CREATININE: 0.85 mg/dL (ref 0.61–1.24)
Chloride: 106 mmol/L (ref 101–111)
GFR calc Af Amer: >60 mL/min (ref >60)
GFR calc non Af Amer: >60 mL/min (ref >60)
GLUCOSE: 137 mg/dL — AB (ref 65–99)
Potassium: 4.3 mmol/L (ref 3.5–5.1)
Sodium: 140 mmol/L (ref 135–145)

## 2016-04-30 LAB — CBC
HEMATOCRIT: 28.1 % — AB (ref 39.0–52.0)
Hemoglobin: 10.4 g/dL — ABNORMAL LOW (ref 13.0–17.0)
MCH: 32.6 pg (ref 26.0–34.0)
MCHC: 37 g/dL — AB (ref 30.0–36.0)
MCV: 88.1 fL (ref 78.0–100.0)
Platelets: 133 10*3/uL — ABNORMAL LOW (ref 150–400)
RBC: 3.19 MIL/uL — ABNORMAL LOW (ref 4.22–5.81)
RDW: 12.6 % (ref 11.5–15.5)
WBC: 10.2 10*3/uL (ref 4.0–10.5)

## 2016-04-30 NOTE — Evaluation (Signed)
Occupational Therapy Evaluation Patient Details Name: Kevin [name] MRN: [id number] DOB: [birth year] Today's Date: 04/30/2016    History of Present Illness s/p L anterior hip replacement PMH: anxiety, HTN, smoker   Clinical Impression   Pt was independent prior to admission. Presents with post operative pain and impaired standing balance. He requires min assist for ADL. Began instruction in compensatory strategies for ADL and in use of 3 in 1. Pt lives with his son and father, his brother will help him into his home and daughter is also available to assist. Pt prefers to be able to perform self care independently. Will follow.    Follow Up Recommendations  No OT follow up    Equipment Recommendations  3 in 1 bedside comode    Recommendations for Other Services       Precautions / Restrictions Precautions Precautions: Fall Restrictions Weight Bearing Restrictions: No      Mobility Bed Mobility Overal bed mobility: Needs Assistance Bed Mobility: Supine to Sit     Supine to sit: Supervision        Transfers Overall transfer level: Needs assistance Equipment used: Rolling walker (2 wheeled) Transfers: Sit to/from Stand Sit to Stand: Min guard         General transfer comment: cues for hand placement, min guard for safety    Balance Overall balance assessment: Needs assistance Sitting-balance support: Feet supported Sitting balance-Leahy Scale: Good       Standing balance-Leahy Scale: Poor                              ADL Overall ADL's : Needs assistance/impaired Eating/Feeding: Independent;Sitting   Grooming: Wash/dry hands;Wash/dry face;Sitting;Set up   Upper Body Bathing: Set up;Sitting   Lower Body Bathing: Minimal assistance;Sit to/from stand   Upper Body Dressing : Set up;Sitting   Lower Body Dressing: Minimal assistance;Sit to/from stand   Toilet Transfer: Min guard;Ambulation;BSC;RW Toilet Transfer Details (indicate cue  type and reason): over toilet Toileting- Clothing Manipulation and Hygiene: Minimal assistance;Sit to/from stand       Functional mobility during ADLs: Min guard;Rolling walker General ADL Comments: Pt would prefer to be independent and use AE for ADL. Instructed in compensatory strategies for LB bathing and dressing and will demonstrate AE next visit. Educated pt in multiple uses of 3 in 1.     Vision     Perception     Praxis      Pertinent Vitals/Pain Pain Assessment: 0-10 Pain Score: 5  Pain Location: L hip Pain Descriptors / Indicators: Constant Pain Intervention(s): Patient requesting pain meds-RN notified;RN gave pain meds during session;Repositioned;Ice applied;Monitored during session     Hand Dominance Right   Extremity/Trunk Assessment Upper Extremity Assessment Upper Extremity Assessment: Overall WFL for tasks assessed (tremulous)   Lower Extremity Assessment Lower Extremity Assessment: Defer to PT evaluation       Communication Communication Communication: No difficulties   Cognition Arousal/Alertness: Awake/alert Behavior During Therapy: WFL for tasks assessed/performed Overall Cognitive Status: Within Functional Limits for tasks assessed                     General Comments       Exercises       Shoulder Instructions      Home Living Family/patient expects to be discharged to:: Private residence Living Arrangements: Children;Parent (son and father) Available Help at Discharge: Family;Available 24 hours/day Type of Home: House Home Access: Stairs  to enter Entrance Stairs-Number of Steps: 2 Entrance Stairs-Rails: None Home Layout: One level     Bathroom Shower/Tub: Teacher, early years/pre: Standard     Home Equipment: None          Prior Functioning/Environment Level of Independence: Independent                 OT Problem List: Decreased activity tolerance;Impaired balance (sitting and/or  standing);Pain;Decreased knowledge of use of DME or AE   OT Treatment/Interventions: Self-care/ADL training;DME and/or AE instruction;Patient/family education;Balance training;Therapeutic activities    OT Goals(Current goals can be found in the care plan section) Acute Rehab OT Goals Patient Stated Goal: return home  OT Goal Formulation: With patient Time For Goal Achievement: 05/07/16 Potential to Achieve Goals: Good ADL Goals Pt Will Perform Grooming: with modified independence;standing Pt Will Perform Lower Body Bathing: with modified independence;with adaptive equipment;sit to/from stand Pt Will Perform Lower Body Dressing: with modified independence;with adaptive equipment;sit to/from stand Pt Will Transfer to Toilet: with modified independence;ambulating;bedside commode (over toilet) Pt Will Perform Toileting - Clothing Manipulation and hygiene: with modified independence;sit to/from stand Pt Will Perform Tub/Shower Transfer: Tub transfer;ambulating;rolling walker;3 in 1;with min guard assist  OT Frequency: Min 2X/week   Barriers to D/C:            Co-evaluation PT/OT/SLP Co-Evaluation/Treatment: Yes Reason for Co-Treatment: For patient/therapist safety   OT goals addressed during session: ADL's and self-care      End of Session Equipment Utilized During Treatment: Rolling walker Nurse Communication: Mobility status  Activity Tolerance: Patient tolerated treatment well Patient left: in chair;with call bell/phone within reach;with chair alarm set;with nursing/sitter in room   Time: [phone] OT Time Calculation (min): 26 min Charges:  OT General Charges $OT Visit: 1 Procedure OT Evaluation $OT Eval Low Complexity: 1 Procedure G-Codes:    Malka So 04/30/2016, 9:21 AM  [phone]

## 2016-04-30 NOTE — Op Note (Signed)
NAMEANHAD, BAREFORD NO.:  [id number]  MEDICAL RECORD NO.:  [id number]  LOCATION:  [id number]                         FACILITY:  [facility]  PHYSICIAN:  Lind Guest. Ninfa Linden, M.D.DATE OF BIRTH:  [birth year]  DATE OF PROCEDURE:  04/29/2016 DATE OF DISCHARGE:                              OPERATIVE REPORT   PREOPERATIVE DIAGNOSIS:  Left hip degenerative joint disease, osteoarthritis, and femoral acetabular impingement.  POSTOPERATIVE DIAGNOSIS:  Left hip degenerative joint disease, osteoarthritis, and femoral acetabular impingement.  PROCEDURE PERFORMED:  Left total hip arthroplasty through direct anterior approach.  IMPLANTS:  DePuy Sector Gription acetabular component size 52, size 36+ 4 polyethylene liner, size 11 Corail femoral component with standard offset, size 36+ 5 ceramic hip ball.  SURGEON:  Mcarthur Rossetti MD.  ASSISTANT:  Erskine Emery, PA-C.  ANESTHESIA:  General.  ANTIBIOTICS:  2 g of IV Ancef.  BLOOD LOSS:  450 mL.  COMPLICATIONS:  None.  INDICATIONS:  Mr. Relph is a 50 year old gentleman with severe left hip pain coming from the femoral acetabular impingement.  He has joint space narrowing on his x-rays.  Flattening and a hump in the femoral head and acetabulum, and his pain is severe.  He has tried and failed all forms of conservative treatment.  We have recommended total hip replacement for him.  We have explained the risks and benefits of surgery in great detail and he does wish to proceed.  PROCEDURE DESCRIPTION:  After informed consent was obtained, appropriate left hip was marked.  He was brought to the operating room, where general anesthesia was obtained while he was on the stretcher.  Traction boots were placed on both is feet.  Next, he was placed supine on the Hana fracture table with a perineal post in place and both legs in inline skeletal traction devices, but no traction applied.  His left operative hip was then  prepped and draped with DuraPrep and sterile drapes.  Time-out was called.  He was identified as correct patient and correct left hip.  I then made an incision inferior and posterior to the anterior superior iliac spine and carried this obliquely down the leg. We dissected down tensor fascia lata muscle.  The tensor fascia was then divided longitudinally, so we could proceed with direct anterior approach to the hip.  We identified and cauterized the circumflex vessels and then identified the hip capsule.  I opened up the hip capsule in an L-type format, finding a moderate joint effusion and significant periarticular osteophytes.  I placed Cobra retractors in the femoral neck and made our femoral neck cut with an oscillating saw proximal to the lesser trochanter and completed this on osteotome.  I placed a corkscrew guide in the femoral head and removed the femoral head in its entirety.  We then cleaned the acetabular remnants of acetabular labrum and other debris.  I placed a bent Hohmann over the medial acetabular rim and removed remnants of the acetabular labrum.  I then began reaming under direct visualization from a size 43 reamer up to a size 52 with all reamers placed under direct visualization.  The last reamer was placed under direct fluoroscopy, so we could obtain our depth  of reaming, our inclination, and anteversion.  Once we were done with this, we placed the real DePuy Sector Gription acetabular component size 52 and a 36+ 4 polyethylene liner for that size acetabular component.  Attention was then turned to the femur.  With the leg externally rotated to 120 degrees, extended and adducted, we were able to place a Mueller retractor medially and Hohmann retractor behind the greater trochanter.  I released the lateral joint capsule.  I used a box cutting osteotome to enter the femoral canal and a rongeur to lateralize.  We then began broaching from a size 8 broach using  the broaching system to 11 .  With a size 11 in place, we trialed a 36+ 1.5 hip ball and brought the leg back over and up with traction and rotation, reducing the pelvis.  I felt we needed a little bit more leg length and offset, but he was stable.  We dislocated the hip and removed the trial components.  we were able to place the real Corail femoral component with standard offset, size 11, and a real 36+ 5 ceramic hip ball and reduced this in the acetabulum.  We were pleased with stability, leg length, and offset.  We then irrigated the soft tissue in normal saline solution using pulsatile lavage.  We closed the joint capsule with interrupted #1 Ethibond suture, followed by running #1 Vicryl in the tensor fascia, 0 Vicryl in the deep tissue, 2-0 Vicryl in the subcutaneous tissue, 4-0 Monocryl subcuticular stitch, and Steri- Strips on the skin.  An Aquacel dressing were applied.  He was taken off the Hana table, awakened, extubated and taken to the recovery room in stable condition.  All final counts were correct.  There were no complications noted.  Of note, Jaci Standard assisted in the entire case. His assistance was crucial for facilitating all aspects of this case.     Lind Guest. Ninfa Linden, M.D.     CYB/MEDQ  D:  04/29/2016  T:  04/30/2016  Job:  [id number]

## 2016-04-30 NOTE — Progress Notes (Signed)
Physical Therapy Treatment Patient Details Name: EDIBERTO FARRELL MRN: [id number] DOB: 10-15-1965 Today's Date: 04/30/2016    History of Present Illness s/p L anterior hip replacement PMH: anxiety, HTN, smoker    PT Comments    The patient is complaining of increased pain of the back and left thigh. He states that partly feels like "sciatica" . . Noted to have buckling of the left leg during ambulation. Distance is limited. Patient most likely will not be at a safe functioning level to DC tomorrow unless pain is controlled and is able to weight bear safely. Continue PT.  Follow Up Recommendations  Home health PT;Supervision/Assistance - 24 hour     Equipment Recommendations  Rolling walker with 5" wheels    Recommendations for Other Services       Precautions / Restrictions Precautions Precautions: Fall    Mobility  Bed Mobility   Bed Mobility: Sit to Supine       Sit to supine: Min assist   General bed mobility comments: assist with the Left leg  Transfers Overall transfer level: Needs assistance Equipment used: Rolling walker (2 wheeled) Transfers: Sit to/from Stand Sit to Stand: Min guard         General transfer comment: cues for hand placement, min guard for safety, moves quickly before RW present  Ambulation/Gait Ambulation/Gait assistance: Min assist;Mod assist Ambulation Distance (Feet): 40 Feet Assistive device: Rolling walker (2 wheeled) Gait Pattern/deviations: Step-to pattern;Antalgic;Decreased step length - left;Decreased stance time - left     General Gait Details: noted decreased weight tolerated on the left leg. At times, noted to buckle . relies on the upper body   Stairs            Wheelchair Mobility    Modified Rankin (Stroke Patients Only)       Balance             Standing balance-Leahy Scale: Poor                      Cognition Arousal/Alertness: Awake/alert Behavior During Therapy: Anxious                         Exercises      General Comments        Pertinent Vitals/Pain Pain Score: 8  Pain Location: R  thigh, back, "sciatica" Pain Descriptors / Indicators: Aching;Cramping;Discomfort;Dull;Grimacing;Guarding;Shooting;Restless Pain Intervention(s): Limited activity within patient's tolerance;Monitored during session;Premedicated before session;Repositioned;RN gave pain meds during session;Ice applied    Home Living                      Prior Function            PT Goals (current goals can now be found in the care plan section) Progress towards PT goals: Progressing toward goals    Frequency    7X/week      PT Plan Current plan remains appropriate    Co-evaluation             End of Session   Activity Tolerance: Patient limited by pain Patient left: in bed;with call bell/phone within reach;with bed alarm set     Time: 1715-1733 PT Time Calculation (min) (ACUTE ONLY): 18 min  Charges:  $Gait Training: 8-22 mins                    G Codes:      Claretha Cooper 04/30/2016, 6:13 PM  Tresa Endo PT [phone]

## 2016-04-30 NOTE — Evaluation (Signed)
Physical Therapy Evaluation Patient Details Name: Kevin [name] MRN: [id number] DOB: [birth year] Today's Date: 04/30/2016   History of Present Illness  s/p L anterior hip replacement PMH: anxiety, HTN, smoker  Clinical Impression  The patient noted to be tremulous. Did ambulate 45'. Patient may benefit from therapy through 10/2 to improve in mobility and in pain control. Pt admitted with above diagnosis. Pt currently with functional limitations due to the deficits listed below (see PT Problem List).  Pt will benefit from skilled PT to increase their independence and safety with mobility to allow discharge to the venue listed below.       Follow Up Recommendations Home health PT;Supervision/Assistance - 24 hour    Equipment Recommendations  Rolling walker with 5" wheels    Recommendations for Other Services       Precautions / Restrictions Precautions Precautions: Fall Restrictions Weight Bearing Restrictions: No      Mobility  Bed Mobility Overal bed mobility: Needs Assistance Bed Mobility: Supine to Sit     Supine to sit: Supervision        Transfers Overall transfer level: Needs assistance Equipment used: Rolling walker (2 wheeled) Transfers: Sit to/from Stand Sit to Stand: Min guard         General transfer comment: cues for hand placement, min guard for safety  Ambulation/Gait Ambulation/Gait assistance: Min assist Ambulation Distance (Feet): 45 Feet Assistive device: Rolling walker (2 wheeled) Gait Pattern/deviations: Step-to pattern;Antalgic     General Gait Details: cues for posture  Stairs            Wheelchair Mobility    Modified Rankin (Stroke Patients Only)       Balance Overall balance assessment: Needs assistance Sitting-balance support: Feet supported Sitting balance-Leahy Scale: Good       Standing balance-Leahy Scale: Poor                               Pertinent Vitals/Pain Pain Assessment: 0-10 Pain  Score: 5  Pain Location: right hip Pain Descriptors / Indicators: Aching;Tightness;Discomfort;Grimacing;Guarding Pain Intervention(s): Premedicated before session;Patient requesting pain meds-RN notified;Repositioned;Ice applied    Home Living Family/patient expects to be discharged to:: Private residence Living Arrangements: Children;Parent (son and father) Available Help at Discharge: Family;Available 24 hours/day Type of Home: House Home Access: Stairs to enter Entrance Stairs-Rails: None Entrance Stairs-Number of Steps: 2 Home Layout: One level Home Equipment: None      Prior Function Level of Independence: Independent               Hand Dominance   Dominant Hand: Right    Extremity/Trunk Assessment   Upper Extremity Assessment: Defer to OT evaluation           Lower Extremity Assessment: LLE deficits/detail   LLE Deficits / Details: advances the leg during ambulation     Communication   Communication: No difficulties  Cognition Arousal/Alertness: Awake/alert Behavior During Therapy: WFL for tasks assessed/performed Overall Cognitive Status: Within Functional Limits for tasks assessed                      General Comments      Exercises Total Joint Exercises Long Arc QuadSinclair Ship;Left;10 reps;Seated (with sheet around to foot to assist extension)   Assessment/Plan    PT Assessment Patient needs continued PT services  PT Problem List Decreased strength;Decreased range of motion;Decreased activity tolerance;Decreased mobility;Decreased knowledge of precautions;Decreased safety awareness;Decreased knowledge of use  of DME;Pain          PT Treatment Interventions DME instruction;Gait training;Stair training;Functional mobility training;Therapeutic activities;Therapeutic exercise;Patient/family education    PT Goals (Current goals can be found in the Care Plan section)  Acute Rehab PT Goals Patient Stated Goal: return home  PT Goal  Formulation: With patient Time For Goal Achievement: 05/04/16 Potential to Achieve Goals: Good    Frequency 7X/week   Barriers to discharge        Co-evaluation   Reason for Co-Treatment: For patient/therapist safety   OT goals addressed during session: ADL's and self-care       End of Session   Activity Tolerance: No increased pain Patient left: in chair;with chair alarm set (with OT) Nurse Communication: Mobility status         Time: [id number] PT Time Calculation (min) (ACUTE ONLY): 13 min   Charges:   PT Evaluation $PT Eval Low Complexity: 1 Procedure     PT G CodesClaretha Cooper 04/30/2016, 9:56 AM Tresa Endo PT [phone]

## 2016-05-01 MED ORDER — OXYCODONE HCL 5 MG PO TABS: 5.0000 mg | ORAL_TABLET | ORAL | 0 refills | Status: DC | PRN

## 2016-05-01 MED ORDER — ASPIRIN 81 MG PO CHEW: 81.0000 mg | CHEWABLE_TABLET | Freq: Two times a day (BID) | ORAL | 0 refills | Status: DC

## 2016-05-01 NOTE — Care Management Note (Signed)
Case Management Note  Patient Details  Name: Kevin [name] MRN: [id number] Date of Birth: 1966-02-10  Subjective/Objective:       S/p L Anterior Hip Replacement            Action/Plan: Discharge Planning:  NCM spoke to pt and offered choice for [facility]. Pt agreeable to [facility] for [facility] PT. Requesting RW and 3n1 bedside commode for home. Contacted [facility] DME rep for equipment for delivery to room.    Expected Discharge Date:                 Expected Discharge Plan:  [place]  In-House Referral:  NA  Discharge planning Services  CM Consult  Post Acute Care Choice:  Home Health Choice offered to:  Patient  DME Arranged:  3-N-1, Walker rolling DME Agency:  [place]:  PT [place]:  [facility] ([facility])  Status of Service:  Completed, signed off  If discussed at [employer] of Stay Meetings, dates discussed:    Additional Comments:  Erenest Rasher, RN 05/01/2016, 11:23 AM

## 2016-05-01 NOTE — Progress Notes (Signed)
Occupational Therapy Treatment Patient Details Name: Kevin [name] MRN: [id number] DOB: February 28, 1966 Today's Date: 05/01/2016    History of present illness s/p L anterior hip replacement PMH: anxiety, HTN, smoker   OT comments  Patient limited by pain this session. Demonstrated AE use and tub transfer technique but pt declined to practice due to pain. OT will continue to follow.   Follow Up Recommendations  No OT follow up    Equipment Recommendations  3 in 1 bedside comode    Recommendations for Other Services      Precautions / Restrictions Precautions Precautions: Fall       Mobility Bed Mobility                  Transfers                      Balance                                   ADL Overall ADL's : Needs assistance/impaired                                       General ADL Comments: Patient reports he "just got comfortable" in bed and did not want to get OOB during session. Demonstrated AE for LB self-care (long sponge, reacher, sock aid, long shoe horn). Patient reports he can borrow reacher from his father. He was educated on where to purchase but states he probably will not purchase. Also demonstrated tub transfer technique with RW but pt declined to practice at this time due to pain. Will follow up tomorrow.      Vision                     Perception     Praxis      Cognition   Behavior During Therapy: WFL for tasks assessed/performed Overall Cognitive Status: Within Functional Limits for tasks assessed                       Extremity/Trunk Assessment               Exercises     Shoulder Instructions       General Comments      Pertinent Vitals/ Pain       Pain Assessment: 0-10 Pain Score: 5  Pain Location: L hip Pain Descriptors / Indicators: Constant;Aching;Guarding Pain Intervention(s): Limited activity within patient's tolerance;Patient requesting pain meds-RN  notified  Home Living                                          Prior Functioning/Environment              Frequency  Min 2X/week        Progress Toward Goals  OT Goals(current goals can now be found in the care plan section)  Progress towards OT goals: Progressing toward goals  Acute Rehab OT Goals Patient Stated Goal: return home   Plan Discharge plan remains appropriate    Co-evaluation                 End of Session     Activity Tolerance Patient limited  by pain   Patient Left in bed;with call bell/phone within reach   Nurse Communication Patient requests pain meds        Time: [id number] OT Time Calculation (min): 11 min  Charges: OT General Charges $OT Visit: 1 Procedure OT Treatments $Self Care/Home Management : 8-22 mins  Saranda Legrande A 05/01/2016, 10:05 AM

## 2016-05-01 NOTE — Progress Notes (Signed)
Subjective: 2 Days Post-Op Procedure(s) (LRB): LEFT TOTAL HIP ARTHROPLASTY ANTERIOR APPROACH (Left) Patient reports pain as severe.  However, has been on Soma and norco for a year, so poor pain tolerance.  Objective: Vital signs in last 24 hours: Temp:  [98.1 F (36.7 C)-99.3 F (37.4 C)] 98.9 F (37.2 C) (10/01 0530) Pulse Rate:  [87-101] 90 (10/01 0530) Resp:  [16-18] 18 (10/01 0530) BP: (111-138)/(62-69) 138/69 (10/01 0530) SpO2:  [98 %-99 %] 99 % (10/01 0530)  Intake/Output from previous day: 09/30 0701 - 10/01 0700 In: 2690.9 [P.O.:1320; I.V.:1370.9] Out: 2275 [Urine:2275] Intake/Output this shift: Total I/O In: -  Out: 1000 [Urine:1000]   Recent Labs  04/30/16 0414  HGB 10.4*    Recent Labs  04/30/16 0414  WBC 10.2  RBC 3.19*  HCT 28.1*  PLT 133*    Recent Labs  04/30/16 0414  NA 140  K 4.3  CL 106  CO2 29  BUN 11  CREATININE 0.85  GLUCOSE 137*  CALCIUM 8.8*   No results for input(s): LABPT, INR in the last 72 hours.  Sensation intact distally Intact pulses distally Dorsiflexion/Plantar flexion intact Incision: scant drainage  Assessment/Plan: 2 Days Post-Op Procedure(s) (LRB): LEFT TOTAL HIP ARTHROPLASTY ANTERIOR APPROACH (Left) Up with therapy Plan for discharge tomorrow Discharge home with home health  Kevin [name] 05/01/2016, 11:50 AM

## 2016-05-01 NOTE — Progress Notes (Signed)
Physical Therapy Treatment Patient Details Name: MATISYAHU FEWKES MRN: [id number] DOB: [birth year] Today's Date: 05/01/2016    History of Present Illness s/p L anterior hip replacement PMH: anxiety, HTN, smoker    PT Comments    POD # 2 am session withheld due to MAX pain and need for IV pain meds.  Slept. Seen in pm, assisted OOB to amb a limited distance in hallway.  Positioned in recliner and applied 4 ICE packs (back, L hip, L thigh)  Follow Up Recommendations   Home Health PT     Equipment Recommendations       Recommendations for Other Services       Precautions / Restrictions Precautions Precautions: Fall Restrictions Weight Bearing Restrictions: No    Mobility  Bed Mobility Overal bed mobility: Needs Assistance Bed Mobility: Supine to Sit     Supine to sit: Supervision     General bed mobility comments: demonstarted and instructed pt how to use a belt to assist L LE off bed.  Pt required increased time.    Transfers Overall transfer level: Needs assistance Equipment used: Rolling walker (2 wheeled) Transfers: Sit to/from Stand Sit to Stand: Min guard;Supervision         General transfer comment: required increased time, moaning in pain.   Ambulation/Gait Ambulation/Gait assistance: Min guard Ambulation Distance (Feet): 18 Feet Assistive device: Rolling walker (2 wheeled) Gait Pattern/deviations: Step-to pattern;Step-through pattern;Decreased stance time - left Gait velocity: decreased   General Gait Details: excessive WBing thru walker and noted grimacing with pain.  VC's to increase heel strike and increase WB thru L LE with each step.    Stairs            Wheelchair Mobility    Modified Rankin (Stroke Patients Only)       Balance                                    Cognition Arousal/Alertness: Awake/alert Behavior During Therapy: WFL for tasks assessed/performed Overall Cognitive Status: Within Functional Limits  for tasks assessed                      Exercises      General Comments        Pertinent Vitals/Pain Pain Assessment: 0-10 Pain Score: 10-Worst pain ever Pain Location: pre medicated still 10/10 L hip pain  Pain Descriptors / Indicators: Grimacing;Guarding;Aching;Constant Pain Intervention(s): Monitored during session;Repositioned;Premedicated before session;Ice applied    Home Living                      Prior Function            PT Goals (current goals can now be found in the care plan section)         Time: [id number] PT Time Calculation (min) (ACUTE ONLY): 26 min  Charges:  $Gait Training: 8-22 mins $Therapeutic Activity: 8-22 mins                    G Codes:      Rica Koyanagi  PTA [facility]  Acute  Rehab Pager      [phone]

## 2016-05-02 ENCOUNTER — Encounter (HOSPITAL_COMMUNITY): Payer: Self-pay | Admitting: Orthopaedic Surgery

## 2016-05-02 MED ORDER — GABAPENTIN 300 MG PO CAPS: 300.0000 mg | ORAL_CAPSULE | Freq: Three times a day (TID) | ORAL | 0 refills | Status: DC

## 2016-05-02 NOTE — Discharge Summary (Signed)
Patient ID: Kevin [name] MRN: [id number] DOB/AGE: 49-Aug-1967 49 y.o.  Admit date: 04/29/2016 Discharge date: 05/02/2016  Admission Diagnoses:  Principal Problem:   Osteoarthritis of left hip Active Problems:   Status post left hip replacement   Discharge Diagnoses:  Same  Past Medical History:  Diagnosis Date  . Anxiety   . Arthritis   . Cancer ([place])   . Depression   . Former consumption of alcohol    quit 2013  . Hypertension   . Insomnia   . Left renal mass 2012   renal cell carcinoma, completely excised  . Migraines     Surgeries: Procedure(s): LEFT TOTAL HIP ARTHROPLASTY ANTERIOR APPROACH on 04/29/2016   Consultants:   Discharged Condition: Improved  Hospital Course: Kevin [name] is an 51 y.o. male who was admitted 04/29/2016 for operative treatment ofOsteoarthritis of left hip. Patient has severe unremitting pain that affects sleep, daily activities, and work/hobbies. After pre-op clearance the patient was taken to the operating room on 04/29/2016 and underwent  Procedure(s): LEFT TOTAL HIP ARTHROPLASTY ANTERIOR APPROACH.    Patient was given perioperative antibiotics: Anti-infectives    Start     Dose/Rate Route Frequency Ordered Stop   04/29/16 1144  ceFAZolin (ANCEF) IVPB 2g/100 mL premix     2 g 200 mL/hr over 30 Minutes Intravenous On call to O.R. 04/29/16 1144 04/29/16 1405       Patient was given sequential compression devices, early ambulation, and chemoprophylaxis to prevent DVT.  Patient benefited maximally from hospital stay and there were no complications.    Recent vital signs: Patient Vitals for the past 24 hrs:  BP Temp Temp src Pulse Resp SpO2  05/02/16 0540 (!) 130/58 99.1 F (37.3 C) Oral 92 18 98 %  05/01/16 2159 (!) 116/93 100 F (37.8 C) Oral 94 18 99 %  05/01/16 1433 122/72 98.7 F (37.1 C) Oral 90 17 98 %     Recent laboratory studies:  Recent Labs  04/30/16 0414  WBC 10.2  HGB 10.4*  HCT 28.1*  PLT 133*  NA 140   K 4.3  CL 106  CO2 29  BUN 11  CREATININE 0.85  GLUCOSE 137*  CALCIUM 8.8*     Discharge Medications:     Medication List    STOP taking these medications   HYDROcodone-acetaminophen 5-325 MG tablet Commonly known as:  NORCO/VICODIN     TAKE these medications   albuterol 108 (90 Base) MCG/ACT inhaler Commonly known as:  PROVENTIL HFA;VENTOLIN HFA Inhale 1-2 puffs into the lungs every 4 (four) hours as needed for wheezing or shortness of breath.   aspirin 81 MG chewable tablet Chew 1 tablet (81 mg total) by mouth 2 (two) times daily.   carisoprodol 350 MG tablet Commonly known as:  SOMA Take 1 tablet (350 mg total) by mouth 3 (three) times daily.   gabapentin 300 MG capsule Commonly known as:  NEURONTIN Take 1 capsule (300 mg total) by mouth 3 (three) times daily.   hydrOXYzine 25 MG tablet Commonly known as:  ATARAX/VISTARIL Take 1 tablet (25 mg total) by mouth every 6 (six) hours as needed for anxiety.   LORazepam 0.5 MG tablet Commonly known as:  ATIVAN Take 0.5 mg by mouth 3 (three) times daily.   metoprolol tartrate 25 MG tablet Commonly known as:  LOPRESSOR Take 25 mg by mouth 2 (two) times daily.   oxyCODONE 5 MG immediate release tablet Commonly known as:  Oxy IR/ROXICODONE Take 1-2 tablets (5-10  mg total) by mouth every 4 (four) hours as needed for severe pain or breakthrough pain.   sertraline 50 MG tablet Commonly known as:  ZOLOFT Take 50 mg by mouth every morning.       Diagnostic Studies: Dg C-arm 1-60 Min-no Report  Result Date: 04/29/2016 CLINICAL DATA: left anterior hip C-ARM 1-60 MINUTES Fluoroscopy was utilized by the requesting physician.  No radiographic interpretation.   Dg Hip Port Unilat With Pelvis 1v Left  Result Date: 04/29/2016 CLINICAL DATA:  Left hip replacement. EXAM: DG HIP (WITH OR WITHOUT PELVIS) 1V PORT LEFT COMPARISON:  11/11/2015 FINDINGS: Left hip arthroplasty is located. No evidence for a periprosthetic fracture.  Expected gas within the soft tissues. Vascular calcifications. Pelvic bony ring is intact. IMPRESSION: Left hip arthroplasty without complicating features. Electronically Signed   By: Markus Daft M.D.   On: 04/29/2016 15:43   Dg Hip Operative Unilat With Pelvis Left  Result Date: 04/29/2016 CLINICAL DATA:  Left total hip replacement. EXAM: OPERATIVE left HIP (WITH PELVIS IF PERFORMED) 2 VIEWS TECHNIQUE: Fluoroscopic spot image(s) were submitted for interpretation post-operatively. FLUOROSCOPY TIME:  18 seconds. COMPARISON:  None. FINDINGS: Two intraoperative fluoroscopic images demonstrate the patient to be status post left total hip arthroplasty. The femoral and acetabular components appear to be well situated. No dislocation is noted. IMPRESSION: Status post left total hip arthroplasty. Electronically Signed   By: Marijo Conception, M.D.   On: 04/29/2016 15:08    Disposition: 01-Home or Self Care  Discharge Instructions    Discharge patient    Complete by:  As directed       [place] .   Why:  [facility] will call to arrange appointment  Contact information: [place] [address] [phone]            Signed: Mcarthur Rossetti 05/02/2016, 7:40 AM

## 2016-05-02 NOTE — Progress Notes (Signed)
PT Cancellation Note  Patient Details Name: KASH MCLINN MRN: [id number] DOB: November 04, 1965   Cancelled Treatment:    Reason Eval/Treat Not Completed: Pain limiting ability to participate. The patient requests to forgo PT prior to DC siting that his pain escalates when he is mobilizing. He has DME and HHPT arranged. Reviewed verbally going up 1 step. RN aware of above.   Claretha Cooper 05/02/2016, 9:18 AM Tresa Endo PT [phone]

## 2016-05-02 NOTE — Progress Notes (Signed)
Physical Therapy Treatment Patient Details Name: Kevin [name] MRN: [id number] DOB: [birth year] Today's Date: 05/02/2016    History of Present Illness s/p L anterior hip replacement PMH: anxiety, HTN, smoker    PT Comments    The patient is progressing but is not placing weight on the left leg during ambulation. Encouraged  AAROM with sheet. Ready for DC  Follow Up Recommendations  Home health PT;Supervision/Assistance - 24 hour     Equipment Recommendations  Rolling walker with 5" wheels    Recommendations for Other Services       Precautions / Restrictions Precautions Precautions: Fall Restrictions Weight Bearing Restrictions: No    Mobility  Bed Mobility Overal bed mobility: Needs Assistance Bed Mobility: Supine to Sit     Supine to sit: Min assist Sit to supine: Min assist   General bed mobility comments: in recliner  Transfers Overall transfer level: Modified independent Equipment used: Rolling walker (2 wheeled) Transfers: Sit to/from [facility] Sit to Stand: Min assist Stand pivot transfers: Min assist       General transfer comment: patient not placing weight on the Left leg  Ambulation/Gait Ambulation/Gait assistance: Supervision Ambulation Distance (Feet): 30 Feet Assistive device: Rolling walker (2 wheeled) Gait Pattern/deviations: Step-to pattern;Decreased stance time - left Gait velocity: decreased   General Gait Details: not placing weight on the Left lef at all during ambulation   Stairs            Wheelchair Mobility    Modified Rankin (Stroke Patients Only)       Balance                                    Cognition Arousal/Alertness: Awake/alert Behavior During Therapy: Anxious Overall Cognitive Status: Within Functional Limits for tasks assessed                      Exercises      General Comments        Pertinent Vitals/Pain Pain Score: 10-Worst pain ever Pain  Location: premedicated Pain Descriptors / Indicators: Aching;Grimacing;Guarding;Crying Pain Intervention(s): Monitored during session    Home Living                      Prior Function            PT Goals (current goals can now be found in the care plan section) Progress towards PT goals: Progressing toward goals    Frequency    7X/week      PT Plan Current plan remains appropriate    Co-evaluation             End of Session   Activity Tolerance: Patient limited by pain Patient left: in chair     Time: 1002-1010 PT Time Calculation (min) (ACUTE ONLY): 8 min  Charges:  $Gait Training: 8-22 mins                    G Codes:      Claretha Cooper 05/02/2016, 10:14 AM

## 2016-05-02 NOTE — Progress Notes (Signed)
Patient verbalized understanding of discharge instructions. Patient is stable at discharge. Patient was given prescriptions and discharge forms. Patient awaiting ride for discharge.

## 2016-05-02 NOTE — Progress Notes (Signed)
Occupational Therapy Treatment Patient Details Name: Kevin [name] MRN: [id number] DOB: [birth year] Today's Date: 05/02/2016    History of present illness s/p L anterior hip replacement PMH: anxiety, HTN, smoker   OT comments  Pt wants pain meds but not due yet  Follow Up Recommendations  No OT follow up    Equipment Recommendations  3 in 1 bedside comode    Recommendations for Other Services      Precautions / Restrictions Precautions Precautions: Fall Restrictions Weight Bearing Restrictions: No       Mobility Bed Mobility Overal bed mobility: Needs Assistance Bed Mobility: Supine to Sit     Supine to sit: Min assist Sit to supine: Min assist      Transfers Overall transfer level: Needs assistance Equipment used: Rolling walker (2 wheeled) Transfers: Sit to/from [facility] Sit to Stand: Min assist Stand pivot transfers: Min assist                ADL Overall ADL's : Needs assistance/impaired         Upper Body Bathing: Set up;Sitting   Lower Body Bathing: Moderate assistance;Sit to/from stand;Cueing for sequencing;Cueing for compensatory techniques;Cueing for safety   Upper Body Dressing : Set up;Sitting   Lower Body Dressing: Maximal assistance;Sit to/from stand;Cueing for sequencing;Cueing for safety Lower Body Dressing Details (indicate cue type and reason): pt states family will A. Pt declined AE ed Toilet Transfer: Min guard;RW;Ambulation;Cueing for sequencing;Cueing for safety   Toileting- Water quality scientist and Hygiene: Min guard;Sit to/from Nurse, children's Details (indicate cue type and reason): declined practice-                     Cognition   Behavior During Therapy: WFL for tasks assessed/performed Overall Cognitive Status: Within Functional Limits for tasks assessed                                    Pertinent Vitals/ Pain       Pain Score: 10-Worst pain ever Pain  Descriptors / Indicators: Guarding;Constant;Sore;Discomfort Pain Intervention(s): Monitored during session     Prior Functioning/Environment              Frequency  Min 2X/week        Progress Toward Goals  OT Goals(current goals can now be found in the care plan section)  Progress towards OT goals: Progressing toward goals     Plan Discharge plan remains appropriate       End of Session Equipment Utilized During Treatment: Rolling walker   Activity Tolerance Patient limited by pain   Patient Left in chair;with call bell/phone within reach   Nurse Communication Mobility status        Time: [id number] OT Time Calculation (min): 20 min  Charges: OT General Charges $OT Visit: 1 Procedure OT Treatments $Self Care/Home Management : 8-22 mins  [name] D 05/02/2016, 9:58 AM

## 2016-05-02 NOTE — Progress Notes (Signed)
Patient ID: Kevin [name], male   DOB: 01-09-1966, 50 y.o.   MRN: [id number] No acute changes.  Vitals stable.  Can be discharged to home today.

## 2016-05-12 ENCOUNTER — Inpatient Hospital Stay (INDEPENDENT_AMBULATORY_CARE_PROVIDER_SITE_OTHER): Payer: BLUE CROSS/BLUE SHIELD | Admitting: Orthopaedic Surgery

## 2016-05-27 ENCOUNTER — Telehealth (INDEPENDENT_AMBULATORY_CARE_PROVIDER_SITE_OTHER): Payer: Self-pay | Admitting: Orthopaedic Surgery

## 2016-05-27 NOTE — Telephone Encounter (Signed)
Pt states he has applied for services with [employer] and they are requesting a letter from Dr Ninfa Linden with his work status. Pt states he can not work right know due to his hip. If letter can be written please fax to [facility]; Wilburt Finlay [phone]

## 2016-05-27 NOTE — Telephone Encounter (Signed)
Please advise, maybe dictate?

## 2016-05-30 ENCOUNTER — Encounter (INDEPENDENT_AMBULATORY_CARE_PROVIDER_SITE_OTHER): Payer: Self-pay | Admitting: Orthopaedic Surgery

## 2016-05-30 NOTE — Telephone Encounter (Signed)
I just did a note.  Please fax it to that person listed Bethena Roys Jone)...thanks

## 2016-05-31 NOTE — Telephone Encounter (Signed)
Faxed note to number provided . ?

## 2016-06-14 ENCOUNTER — Ambulatory Visit (INDEPENDENT_AMBULATORY_CARE_PROVIDER_SITE_OTHER): Payer: BLUE CROSS/BLUE SHIELD | Admitting: Orthopaedic Surgery

## 2016-06-27 ENCOUNTER — Encounter: Payer: Self-pay | Admitting: Orthopedic Surgery

## 2016-08-05 ENCOUNTER — Other Ambulatory Visit: Payer: Self-pay | Admitting: Orthopedic Surgery

## 2016-08-05 DIAGNOSIS — M5442 Lumbago with sciatica, left side: Secondary | ICD-10-CM

## 2016-08-06 ENCOUNTER — Emergency Department (HOSPITAL_COMMUNITY)
Admission: EM | Admit: 2016-08-06 | Discharge: 2016-08-06 | Disposition: A | Discharge status: Home / Self Care | Payer: BLUE CROSS/BLUE SHIELD | Attending: Emergency Medicine | Admitting: Emergency Medicine

## 2016-08-06 ENCOUNTER — Encounter (HOSPITAL_COMMUNITY): Payer: Self-pay | Admitting: *Deleted

## 2016-08-06 DIAGNOSIS — Z859 Personal history of malignant neoplasm, unspecified: Secondary | ICD-10-CM | POA: Insufficient documentation

## 2016-08-06 DIAGNOSIS — K529 Noninfective gastroenteritis and colitis, unspecified: Secondary | ICD-10-CM | POA: Insufficient documentation

## 2016-08-06 DIAGNOSIS — Z79899 Other long term (current) drug therapy: Secondary | ICD-10-CM | POA: Insufficient documentation

## 2016-08-06 DIAGNOSIS — Z7982 Long term (current) use of aspirin: Secondary | ICD-10-CM | POA: Insufficient documentation

## 2016-08-06 DIAGNOSIS — I10 Essential (primary) hypertension: Secondary | ICD-10-CM | POA: Insufficient documentation

## 2016-08-06 DIAGNOSIS — F17210 Nicotine dependence, cigarettes, uncomplicated: Secondary | ICD-10-CM | POA: Insufficient documentation

## 2016-08-06 LAB — COMPREHENSIVE METABOLIC PANEL
ALBUMIN: 4.6 g/dL (ref 3.5–5.0)
ALK PHOS: 54 U/L (ref 38–126)
ALT: 16 U/L — ABNORMAL LOW (ref 17–63)
AST: 24 U/L (ref 15–41)
Anion gap: 8 (ref 5–15)
BILIRUBIN TOTAL: 0.5 mg/dL (ref 0.3–1.2)
BUN: 10 mg/dL (ref 6–20)
CALCIUM: 9.6 mg/dL (ref 8.9–10.3)
CO2: 30 mmol/L (ref 22–32)
CREATININE: 0.89 mg/dL (ref 0.61–1.24)
Chloride: 100 mmol/L — ABNORMAL LOW (ref 101–111)
GFR calc Af Amer: >60 mL/min (ref >60)
GFR calc non Af Amer: >60 mL/min (ref >60)
GLUCOSE: 95 mg/dL (ref 65–99)
Potassium: 3.6 mmol/L (ref 3.5–5.1)
Sodium: 138 mmol/L (ref 135–145)
TOTAL PROTEIN: 7.3 g/dL (ref 6.5–8.1)

## 2016-08-06 LAB — URINALYSIS, ROUTINE W REFLEX MICROSCOPIC
BILIRUBIN URINE: NEGATIVE
Glucose, UA: NEGATIVE mg/dL
Hgb urine dipstick: NEGATIVE
KETONES UR: NEGATIVE mg/dL
Leukocytes, UA: NEGATIVE
NITRITE: NEGATIVE
PH: 6 (ref 5.0–8.0)
Protein, ur: NEGATIVE mg/dL
Specific Gravity, Urine: 1.023 (ref 1.005–1.030)

## 2016-08-06 LAB — CBC
HCT: 41.8 % (ref 39.0–52.0)
Hemoglobin: 14.7 g/dL (ref 13.0–17.0)
MCH: 31.5 pg (ref 26.0–34.0)
MCHC: 35.2 g/dL (ref 30.0–36.0)
MCV: 89.5 fL (ref 78.0–100.0)
Platelets: 151 10*3/uL (ref 150–400)
RBC: 4.67 MIL/uL (ref 4.22–5.81)
RDW: 11.9 % (ref 11.5–15.5)
WBC: 4.4 10*3/uL (ref 4.0–10.5)

## 2016-08-06 LAB — LIPASE, BLOOD: Lipase: 23 U/L (ref 11–51)

## 2016-08-06 MED ORDER — PANTOPRAZOLE SODIUM 40 MG IV SOLR
40.0000 mg | Freq: Once | INTRAVENOUS | Status: AC
  Administered 2016-08-06: 40 mg via INTRAVENOUS
  Filled 2016-08-06: qty 40

## 2016-08-06 MED ORDER — PANTOPRAZOLE SODIUM 20 MG PO TBEC: 20.0000 mg | DELAYED_RELEASE_TABLET | Freq: Every day | ORAL | 0 refills | Status: DC

## 2016-08-06 MED ORDER — KETOROLAC TROMETHAMINE 30 MG/ML IJ SOLN
30.0000 mg | Freq: Once | INTRAMUSCULAR | Status: AC
  Administered 2016-08-06: 30 mg via INTRAVENOUS
  Filled 2016-08-06: qty 1

## 2016-08-06 MED ORDER — ONDANSETRON HCL 4 MG/2ML IJ SOLN
4.0000 mg | Freq: Once | INTRAMUSCULAR | Status: AC
Start: 2016-08-06 — End: 2016-08-06
  Administered 2016-08-06: 4 mg via INTRAVENOUS
  Filled 2016-08-06: qty 2

## 2016-08-06 MED ORDER — ONDANSETRON 4 MG PO TBDP: ORAL_TABLET | ORAL | 0 refills | Status: DC

## 2016-08-06 MED ORDER — SODIUM CHLORIDE 0.9 % IV BOLUS (SEPSIS)
2000.0000 mL | Freq: Once | INTRAVENOUS | Status: AC
  Administered 2016-08-06: 2000 mL via INTRAVENOUS

## 2016-08-06 NOTE — Discharge Instructions (Signed)
Drink plenty of fluids and follow-up with your doctor next week for recheck °

## 2016-08-06 NOTE — ED Provider Notes (Signed)
[facility] DEPT Provider Note   CSN: [id number] Arrival date & time: 08/06/16  1742     History   Chief Complaint Chief Complaint  Patient presents with  . Emesis    HPI Kevin [name] is a 51 y.o. male.  Patient complains of vomiting for about a week and some loose stools that have been black. The patient has been using Pepto-Bismol   The history is provided by the patient. No language interpreter was used.  Emesis   This is a new problem. The current episode started more than 2 days ago. The problem occurs 2 to 4 times per day. The problem has not changed since onset.The emesis has an appearance of stomach contents. There has been no fever. Associated symptoms include diarrhea. Pertinent negatives include no abdominal pain, no chills, no cough and no headaches.    Past Medical History:  Diagnosis Date  . Anxiety   . Arthritis   . Cancer ([place])   . Depression   . Former consumption of alcohol    quit 2013  . Hypertension   . Insomnia   . Left renal mass 2012   renal cell carcinoma, completely excised  . Migraines     Patient Active Problem List   Diagnosis Date Noted  . Osteoarthritis of left hip 04/29/2016  . Status post left hip replacement 04/29/2016    Past Surgical History:  Procedure Laterality Date  . MANDIBLE FRACTURE SURGERY  age 8  . ROBOTIC ASSITED PARTIAL NEPHRECTOMY Left 2012  . TOTAL HIP ARTHROPLASTY Left 04/29/2016   Procedure: LEFT TOTAL HIP ARTHROPLASTY ANTERIOR APPROACH;  Surgeon: Mcarthur Rossetti, MD;  Location: [facility] ORS;  Service: Orthopedics;  Laterality: Left;       Home Medications    Prior to Admission medications   Medication Sig Start Date End Date Taking? Authorizing Provider  albuterol (PROVENTIL HFA;VENTOLIN HFA) 108 (90 Base) MCG/ACT inhaler Inhale 1-2 puffs into the lungs every 4 (four) hours as needed for wheezing or shortness of breath. Patient not taking: Reported on 04/25/2016 12/24/15   Margarita Mail, PA-C    aspirin 81 MG chewable tablet Chew 1 tablet (81 mg total) by mouth 2 (two) times daily. 05/01/16   Mcarthur Rossetti, MD  carisoprodol (SOMA) 350 MG tablet Take 1 tablet (350 mg total) by mouth 3 (three) times daily. 03/15/16   Carole Civil, MD  gabapentin (NEURONTIN) 300 MG capsule Take 1 capsule (300 mg total) by mouth 3 (three) times daily. 05/02/16   Mcarthur Rossetti, MD  hydrOXYzine (ATARAX/VISTARIL) 25 MG tablet Take 1 tablet (25 mg total) by mouth every 6 (six) hours as needed for anxiety. Patient not taking: Reported on 04/25/2016 02/21/16   [name], MD  LORazepam (ATIVAN) 0.5 MG tablet Take 0.5 mg by mouth 3 (three) times daily.    Historical Provider, MD  metoprolol tartrate (LOPRESSOR) 25 MG tablet Take 25 mg by mouth 2 (two) times daily.    Historical Provider, MD  ondansetron (ZOFRAN ODT) 4 MG disintegrating tablet 4mg  ODT q4 hours prn nausea/vomit 08/06/16   Milton Ferguson, MD  oxyCODONE (OXY IR/ROXICODONE) 5 MG immediate release tablet Take 1-2 tablets (5-10 mg total) by mouth every 4 (four) hours as needed for severe pain or breakthrough pain. 05/01/16   Mcarthur Rossetti, MD  pantoprazole (PROTONIX) 20 MG tablet Take 1 tablet (20 mg total) by mouth daily. 08/06/16   Milton Ferguson, MD  sertraline (ZOLOFT) 50 MG tablet Take 50 mg by mouth  every morning.    Historical Provider, MD    Family History Family History  Problem Relation Age of Onset  . Cancer Mother     Social History Social History  Substance Use Topics  . Smoking status: Current Every Day Smoker    Packs/day: 0.50    Years: 30.00    Types: Cigarettes, E-cigarettes  . Smokeless tobacco: Never Used  . Alcohol use No     Comment: used to/quit 2013     Allergies   Patient has no known allergies.   Review of Systems Review of Systems  Constitutional: Negative for appetite change, chills and fatigue.  HENT: Negative for congestion, ear discharge and sinus pressure.   Eyes: Negative for  discharge.  Respiratory: Negative for cough.   Cardiovascular: Negative for chest pain.  Gastrointestinal: Positive for diarrhea and vomiting. Negative for abdominal pain.  Genitourinary: Negative for frequency and hematuria.  Musculoskeletal: Negative for back pain.  Skin: Negative for rash.  Neurological: Negative for seizures and headaches.  Psychiatric/Behavioral: Negative for hallucinations.     Physical Exam Updated Vital Signs BP 148/84   Pulse 68   Temp 98 F (36.7 C) (Oral)   Resp 18   Ht 5\' 6"  (1.676 m)   Wt 150 lb (68 kg)   SpO2 98%   BMI 24.21 kg/m   Physical Exam  Constitutional: He is oriented to person, place, and time. He appears well-developed.  HENT:  Head: Normocephalic.  Eyes: Conjunctivae and EOM are normal. No scleral icterus.  Neck: Neck supple. No thyromegaly present.  Cardiovascular: Normal rate and regular rhythm.  Exam reveals no gallop and no friction rub.   No murmur heard. Pulmonary/Chest: No stridor. He has no wheezes. He has no rales. He exhibits no tenderness.  Abdominal: He exhibits no distension. There is tenderness. There is no rebound.  Mild tenderness  Genitourinary:  Genitourinary Comments: Rectal hem neg  Musculoskeletal: Normal range of motion. He exhibits no edema.  Lymphadenopathy:    He has no cervical adenopathy.  Neurological: He is oriented to person, place, and time. He exhibits normal muscle tone. Coordination normal.  Skin: No rash noted. No erythema.  Psychiatric: He has a normal mood and affect. His behavior is normal.     ED Treatments / Results  Labs (all labs ordered are listed, but only abnormal results are displayed) Labs Reviewed  COMPREHENSIVE METABOLIC PANEL - Abnormal; Notable for the following:       Result Value   Chloride 100 (*)    ALT 16 (*)    All other components within normal limits  LIPASE, BLOOD  CBC  URINALYSIS, ROUTINE W REFLEX MICROSCOPIC    EKG  EKG Interpretation None        Radiology No results found.  Procedures Procedures (including critical care time)  Medications Ordered in ED Medications  sodium chloride 0.9 % bolus 2,000 mL (2,000 mLs Intravenous New Bag/Given 08/06/16 1917)  ondansetron (ZOFRAN) injection 4 mg (4 mg Intravenous Given 08/06/16 1917)  pantoprazole (PROTONIX) injection 40 mg (40 mg Intravenous Given 08/06/16 1918)  ketorolac (TORADOL) 30 MG/ML injection 30 mg (30 mg Intravenous Given 08/06/16 1918)     Initial Impression / Assessment and Plan / ED Course  I have reviewed the triage vital signs and the nursing notes.  Pertinent labs & imaging results that were available during my care of the patient were reviewed by me and considered in my medical decision making (see chart for details).  Clinical Course  Patient with gastroenteritis. Patient will be treated with Zofran protonic and will follow-up with his PCP Final Clinical Impressions(s) / ED Diagnoses   Final diagnoses:  Gastroenteritis    New Prescriptions New Prescriptions   ONDANSETRON (ZOFRAN ODT) 4 MG DISINTEGRATING TABLET    4mg  ODT q4 hours prn nausea/vomit   PANTOPRAZOLE (PROTONIX) 20 MG TABLET    Take 1 tablet (20 mg total) by mouth daily.     Milton Ferguson, MD 08/06/16 2041

## 2016-08-06 NOTE — ED Triage Notes (Signed)
Pt c/o emesis and diarrhea over the past week and a half. 6 episodes of vomiting over the last 24 hours. Pt reports dark black stool. Reports taking Pepto-Bismol 3-4 days ago.

## 2016-08-08 ENCOUNTER — Other Ambulatory Visit: Payer: Self-pay | Admitting: *Deleted

## 2016-08-08 DIAGNOSIS — M5442 Lumbago with sciatica, left side: Secondary | ICD-10-CM

## 2016-08-08 MED ORDER — CARISOPRODOL 350 MG PO TABS: 350.0000 mg | ORAL_TABLET | Freq: Three times a day (TID) | ORAL | 3 refills | Status: DC

## 2016-08-09 ENCOUNTER — Ambulatory Visit: Payer: Self-pay | Admitting: Orthopaedic Surgery

## 2016-08-12 ENCOUNTER — Emergency Department (HOSPITAL_COMMUNITY)
Admission: EM | Admit: 2016-08-12 | Discharge: 2016-08-12 | Disposition: A | Discharge status: Home / Self Care | Payer: Medicaid Other | Attending: Emergency Medicine | Admitting: Emergency Medicine

## 2016-08-12 ENCOUNTER — Emergency Department (HOSPITAL_COMMUNITY): Payer: Medicaid Other

## 2016-08-12 ENCOUNTER — Encounter (HOSPITAL_COMMUNITY): Payer: Self-pay | Admitting: Emergency Medicine

## 2016-08-12 DIAGNOSIS — Z7982 Long term (current) use of aspirin: Secondary | ICD-10-CM | POA: Insufficient documentation

## 2016-08-12 DIAGNOSIS — I10 Essential (primary) hypertension: Secondary | ICD-10-CM | POA: Insufficient documentation

## 2016-08-12 DIAGNOSIS — Z79899 Other long term (current) drug therapy: Secondary | ICD-10-CM | POA: Insufficient documentation

## 2016-08-12 DIAGNOSIS — F17210 Nicotine dependence, cigarettes, uncomplicated: Secondary | ICD-10-CM | POA: Insufficient documentation

## 2016-08-12 DIAGNOSIS — M25552 Pain in left hip: Secondary | ICD-10-CM

## 2016-08-12 MED ORDER — CYCLOBENZAPRINE HCL 10 MG PO TABS: 10.0000 mg | ORAL_TABLET | Freq: Two times a day (BID) | ORAL | 0 refills | Status: DC | PRN

## 2016-08-12 MED ORDER — OXYCODONE-ACETAMINOPHEN 5-325 MG PO TABS
1.0000 | ORAL_TABLET | Freq: Once | ORAL | Status: AC
  Administered 2016-08-12: 1 via ORAL
  Filled 2016-08-12: qty 1

## 2016-08-12 MED ORDER — HYDROCODONE-ACETAMINOPHEN 5-325 MG PO TABS: 1.0000 | ORAL_TABLET | ORAL | 0 refills | Status: DC | PRN

## 2016-08-12 MED ORDER — CYCLOBENZAPRINE HCL 10 MG PO TABS
10.0000 mg | ORAL_TABLET | Freq: Once | ORAL | Status: AC
  Administered 2016-08-12: 10 mg via ORAL
  Filled 2016-08-12: qty 1

## 2016-08-12 NOTE — ED Notes (Signed)
Patient transported to X-ray 

## 2016-08-12 NOTE — Discharge Instructions (Signed)
Do not drive while taking the medications because they will make you sleepy. Follow up with Dr. Luna Glasgow as scheduled. Return here as needed.

## 2016-08-12 NOTE — ED Notes (Signed)
Pt reports pain x over one week.  States Dr Rush Farmer did his hip replacement but that he has an appointment with Dr Luna Glasgow next Friday  Reports he has taken no pain meds save ibuprofen which he is taking "three or four ever 6 hours"

## 2016-08-12 NOTE — ED Notes (Signed)
HN in to reassess pt

## 2016-08-12 NOTE — ED Provider Notes (Signed)
[facility] DEPT Provider Note   CSN: [id number] Arrival date & time: 08/12/16  1610     History   Chief Complaint Chief Complaint  Patient presents with  . Hip Pain    HPI Kevin [name]. is a 51 y.o. male who presents to the ED with left hip pain. The pain started 2 weeks ago. Patient reports having a total hip replacement in September 2017 by Dr. Ninfa Linden since Dr. Luna Glasgow no longer does surgery. He has an appointment next week to follow up with Dr. Luna Glasgow but came today with request for x-ray to be sure nothing had messed up on the replacement and for pain. Patient reports taking ibuprofen for pain but it does not help very much. He had kidney cancer and does not like to take much ibuprofen. He had been taking oxycodone after the surgery but has been off of it for the past 2 months. Patient reports that the pain today is similar to the pain he had prior to the surgery.   HPI  Past Medical History:  Diagnosis Date  . Anxiety   . Arthritis   . Cancer ([place])   . Depression   . Former consumption of alcohol    quit 2013  . Hypertension   . Insomnia   . Left renal mass 2012   renal cell carcinoma, completely excised  . Migraines     Patient Active Problem List   Diagnosis Date Noted  . Osteoarthritis of left hip 04/29/2016  . Status post left hip replacement 04/29/2016    Past Surgical History:  Procedure Laterality Date  . MANDIBLE FRACTURE SURGERY  age 17  . ROBOTIC ASSITED PARTIAL NEPHRECTOMY Left 2012  . TOTAL HIP ARTHROPLASTY Left 04/29/2016   Procedure: LEFT TOTAL HIP ARTHROPLASTY ANTERIOR APPROACH;  Surgeon: Mcarthur Rossetti, MD;  Location: [facility];  Service: Orthopedics;  Laterality: Left;       Home Medications    Prior to Admission medications   Medication Sig Start Date End Date Taking? Authorizing Provider  albuterol (PROVENTIL HFA;VENTOLIN HFA) 108 (90 Base) MCG/ACT inhaler Inhale 1-2 puffs into the lungs every 4 (four) hours as needed  for wheezing or shortness of breath. Patient not taking: Reported on 04/25/2016 12/24/15   Margarita Mail, PA-C  aspirin 81 MG chewable tablet Chew 1 tablet (81 mg total) by mouth 2 (two) times daily. 05/01/16   Mcarthur Rossetti, MD  carisoprodol (SOMA) 350 MG tablet Take 1 tablet (350 mg total) by mouth 3 (three) times daily. 08/08/16   Carole Civil, MD  cyclobenzaprine (FLEXERIL) 10 MG tablet Take 1 tablet (10 mg total) by mouth 2 (two) times daily as needed for muscle spasms. 08/12/16   [name], NP  gabapentin (NEURONTIN) 300 MG capsule Take 1 capsule (300 mg total) by mouth 3 (three) times daily. 05/02/16   Mcarthur Rossetti, MD  HYDROcodone-acetaminophen (NORCO/VICODIN) 5-325 MG tablet Take 1 tablet by mouth every 4 (four) hours as needed. 08/12/16   [name], NP  hydrOXYzine (ATARAX/VISTARIL) 25 MG tablet Take 1 tablet (25 mg total) by mouth every 6 (six) hours as needed for anxiety. Patient not taking: Reported on 04/25/2016 02/21/16   [name], MD  LORazepam (ATIVAN) 0.5 MG tablet Take 0.5 mg by mouth 3 (three) times daily.    Historical Provider, MD  metoprolol tartrate (LOPRESSOR) 25 MG tablet Take 25 mg by mouth 2 (two) times daily.    Historical Provider, MD  ondansetron (ZOFRAN ODT)  4 MG disintegrating tablet 4mg  ODT q4 hours prn nausea/vomit 08/06/16   Milton Ferguson, MD  oxyCODONE (OXY IR/ROXICODONE) 5 MG immediate release tablet Take 1-2 tablets (5-10 mg total) by mouth every 4 (four) hours as needed for severe pain or breakthrough pain. 05/01/16   Mcarthur Rossetti, MD  pantoprazole (PROTONIX) 20 MG tablet Take 1 tablet (20 mg total) by mouth daily. 08/06/16   Milton Ferguson, MD  sertraline (ZOLOFT) 50 MG tablet Take 50 mg by mouth every morning.    Historical Provider, MD    Family History Family History  Problem Relation Age of Onset  . Cancer Mother     Social History Social History  Substance Use Topics  . Smoking status: Current Every Day Smoker     Packs/day: 0.50    Years: 30.00    Types: Cigarettes, E-cigarettes  . Smokeless tobacco: Never Used  . Alcohol use No     Comment: used to/quit 2013     Allergies   Patient has no known allergies.   Review of Systems Review of Systems  Constitutional: Negative for chills and fever.  HENT: Negative.   Respiratory: Negative for cough and wheezing.   Cardiovascular: Negative for chest pain.  Gastrointestinal: Negative for abdominal pain, nausea and vomiting.  Genitourinary: Negative for dysuria and frequency.  Musculoskeletal: Positive for arthralgias.       Left hip pain  Skin: Negative for wound.  Psychiatric/Behavioral: Negative for confusion.     Physical Exam Updated Vital Signs BP 121/72 (BP Location: Left Arm)   Pulse 69   Temp 98.2 F (36.8 C) (Oral)   Resp 18   Ht 5\' 6"  (1.676 m)   Wt 70.3 kg   SpO2 100%   BMI 25.02 kg/m   Physical Exam  Constitutional: He is oriented to person, place, and time. He appears well-developed and well-nourished. No distress.  HENT:  Head: Normocephalic and atraumatic.  Eyes: EOM are normal.  Neck: Normal range of motion. Neck supple.  Cardiovascular: Normal rate.   Pulmonary/Chest: Effort normal.  Abdominal: Soft. There is no tenderness. Hernia confirmed negative in the left inguinal area.  Musculoskeletal:       Left hip: He exhibits tenderness. He exhibits normal strength.  Full passive range of motion of the left hip with mild/moderate pain. Full range of motion of the knee without pain. Pedal pulse 2+.   Lymphadenopathy: No inguinal adenopathy noted on the left side.  Neurological: He is alert and oriented to person, place, and time.  Pain of left hip with ambulation.   Skin: Skin is warm and dry.  Psychiatric: He has a normal mood and affect. His behavior is normal.  Nursing note and vitals reviewed.    ED Treatments / Results  Labs (all labs ordered are listed, but only abnormal results are displayed) Labs  Reviewed - No data to display  Radiology Dg Hip Unilat With Pelvis 2-3 Views Left  Result Date: 08/12/2016 CLINICAL DATA:  Left hip pain for 1 week and a half EXAM: DG HIP (WITH OR WITHOUT PELVIS) 2-3V LEFT COMPARISON:  04/29/2016 FINDINGS: Three views of the left hip submitted. No acute fracture or subluxation. There is left hip prosthesis with anatomic alignment. No evidence of prosthesis loosening. IMPRESSION: No acute fracture or subluxation. Left hip prosthesis with anatomic alignment. Electronically Signed   By: Lahoma Crocker M.D.   On: 08/12/2016 17:10    Procedures Procedures (including critical care time)  Medications Ordered in ED Medications  cyclobenzaprine (FLEXERIL) tablet 10 mg (10 mg Oral Given 08/12/16 1802)  oxyCODONE-acetaminophen (PERCOCET/ROXICET) 5-325 MG per tablet 1 tablet (1 tablet Oral Given 08/12/16 1802)     Initial Impression / Assessment and Plan / ED Course  I have reviewed the triage vital signs and the nursing notes.  Pertinent  imaging results that were available during my care of the patient were reviewed by me and considered in my medical decision making (see chart for details).  Clinical Course   51 y.o. male with left hip pain x 2 weeks and hx of left hip replacement stable for d/c without acute findings on x-ray and patient is able to ambulate in the department.  He will f/u with Dr. Luna Glasgow as planned. He will return here as needed for worsening symptoms  Final Clinical Impressions(s) / ED Diagnoses   Final diagnoses:  Left hip pain    New Prescriptions New Prescriptions   CYCLOBENZAPRINE (FLEXERIL) 10 MG TABLET    Take 1 tablet (10 mg total) by mouth 2 (two) times daily as needed for muscle spasms.   HYDROCODONE-ACETAMINOPHEN (NORCO/VICODIN) 5-325 MG TABLET    Take 1 tablet by mouth every 4 (four) hours as needed.     [place], NP 08/12/16 [place], MD 08/12/16 [phone]

## 2016-08-12 NOTE — ED Triage Notes (Signed)
Patient complains of left hip pain. States left hip surgery in September of 2017. States was seeing Dr. Rush Farmer in [place], but was released. Patient states he does not know what it is starting to hurt again.

## 2016-08-12 NOTE — ED Notes (Signed)
Discharge delayed due to registration in with pt

## 2016-08-16 ENCOUNTER — Ambulatory Visit (INDEPENDENT_AMBULATORY_CARE_PROVIDER_SITE_OTHER): Payer: Self-pay | Admitting: Orthopaedic Surgery

## 2016-08-16 VITALS — BP 145/91 | HR 108 | Temp 97.9°F | Ht 66.0 in | Wt 163.0 lb

## 2016-08-16 DIAGNOSIS — M5442 Lumbago with sciatica, left side: Secondary | ICD-10-CM

## 2016-08-16 DIAGNOSIS — G8929 Other chronic pain: Secondary | ICD-10-CM

## 2016-08-16 MED ORDER — HYDROCODONE-ACETAMINOPHEN 5-325 MG PO TABS: 1.0000 | ORAL_TABLET | ORAL | 0 refills | Status: DC | PRN

## 2016-08-16 MED ORDER — PREDNISONE 5 MG (21) PO TBPK: ORAL_TABLET | ORAL | 0 refills | Status: DC

## 2016-08-16 NOTE — Progress Notes (Signed)
Patient Kevin [name]:[id number] L Jeffry Riss., male DOB:1966-06-02, 51 y.o. [id number]  Chief Complaint  Patient presents with  . Follow-up    Left Hip Pain and back pain    HPI  Kevin [name]. is a 51 y.o. male who has recurrent lower back pain with left sided sciatica.  He had a total hip done by Dr. Ninfa Linden in [place] several months ago. He is doing well.  His back pain has recurred and is worse.  He has no weakness or bowel or bladder problems.  He is taking ibuprofen. HPI  Body mass index is 26.31 kg/m.  ROS  Review of Systems  Past Medical History:  Diagnosis Date  . Anxiety   . Arthritis   . Cancer ([place])   . Depression   . Former consumption of alcohol    quit 2013  . Hypertension   . Insomnia   . Left renal mass 2012   renal cell carcinoma, completely excised  . Migraines     Past Surgical History:  Procedure Laterality Date  . MANDIBLE FRACTURE SURGERY  age 47  . ROBOTIC ASSITED PARTIAL NEPHRECTOMY Left 2012  . TOTAL HIP ARTHROPLASTY Left 04/29/2016   Procedure: LEFT TOTAL HIP ARTHROPLASTY ANTERIOR APPROACH;  Surgeon: Mcarthur Rossetti, MD;  Location: [facility];  Service: Orthopedics;  Laterality: Left;    Family History  Problem Relation Age of Onset  . Cancer Mother     Social History Social History  Substance Use Topics  . Smoking status: Current Every Day Smoker    Packs/day: 0.50    Years: 30.00    Types: Cigarettes, E-cigarettes  . Smokeless tobacco: Never Used  . Alcohol use No     Comment: used to/quit 2013    No Known Allergies  Current Outpatient Prescriptions  Medication Sig Dispense Refill  . albuterol (PROVENTIL HFA;VENTOLIN HFA) 108 (90 Base) MCG/ACT inhaler Inhale 1-2 puffs into the lungs every 4 (four) hours as needed for wheezing or shortness of breath. (Patient not taking: Reported on 04/25/2016) 1 Inhaler 0  . aspirin 81 MG chewable tablet Chew 1 tablet (81 mg total) by mouth 2 (two) times daily. 30 tablet 0  . carisoprodol  (SOMA) 350 MG tablet Take 1 tablet (350 mg total) by mouth 3 (three) times daily. 90 tablet 3  . cyclobenzaprine (FLEXERIL) 10 MG tablet Take 1 tablet (10 mg total) by mouth 2 (two) times daily as needed for muscle spasms. 20 tablet 0  . gabapentin (NEURONTIN) 300 MG capsule Take 1 capsule (300 mg total) by mouth 3 (three) times daily. 40 capsule 0  . HYDROcodone-acetaminophen (NORCO/VICODIN) 5-325 MG tablet Take 1 tablet by mouth every 4 (four) hours as needed for moderate pain (Must last 14 days.Do not take and drive a car or use machinery.). 56 tablet 0  . hydrOXYzine (ATARAX/VISTARIL) 25 MG tablet Take 1 tablet (25 mg total) by mouth every 6 (six) hours as needed for anxiety. (Patient not taking: Reported on 04/25/2016) 20 tablet 0  . LORazepam (ATIVAN) 0.5 MG tablet Take 0.5 mg by mouth 3 (three) times daily.    . metoprolol tartrate (LOPRESSOR) 25 MG tablet Take 25 mg by mouth 2 (two) times daily.    . ondansetron (ZOFRAN ODT) 4 MG disintegrating tablet 4mg  ODT q4 hours prn nausea/vomit 12 tablet 0  . pantoprazole (PROTONIX) 20 MG tablet Take 1 tablet (20 mg total) by mouth daily. 30 tablet 0  . sertraline (ZOLOFT) 50 MG tablet Take 50 mg by mouth  every morning.     No current facility-administered medications for this visit.      Physical Exam  Blood pressure (!) 145/91, pulse (!) 108, temperature 97.9 F (36.6 C), height 5\' 6"  (1.676 m), weight 163 lb (73.9 kg).  Constitutional: overall normal hygiene, normal nutrition, well developed, normal grooming, normal body habitus. Assistive device:cane  Musculoskeletal: gait and station Limp left, muscle tone and strength are normal, no tremors or atrophy is present.  .  Neurological: coordination overall normal.  Deep tendon reflex/nerve stretch intact.  Sensation normal.  Cranial nerves II-XII intact.   Skin:   Normal overall no scars, lesions, ulcers or rashes. No psoriasis.  Psychiatric: Alert and oriented x 3.  Recent memory  intact, remote memory unclear.  Normal mood and affect. Well groomed.  Good eye contact.  Cardiovascular: overall no swelling, no varicosities, no edema bilaterally, normal temperatures of the legs and arms, no clubbing, cyanosis and good capillary refill.  Lymphatic: palpation is normal.  He is walking well with cane.  He still has limp to the left.  Spine/Pelvis examination:  Inspection:  Overall, sacoiliac joint benign and hips nontender; without crepitus or defects.   Thoracic spine inspection: Alignment normal without kyphosis present   Lumbar spine inspection:  Alignment  with normal lumbar lordosis, with scoliosis apparent.   Thoracic spine palpation:  without tenderness of spinal processes   Lumbar spine palpation: with tenderness of lumbar area; without tightness of lumbar muscles    Range of Motion:   Lumbar flexion, forward flexion is 35 with pain or tenderness    Lumbar extension is 10 with pain or tenderness   Left lateral bend is Normal  without pain or tenderness   Right lateral bend is Normal without pain or tenderness   Straight leg raising is Normal   Strength & tone: Normal   Stability overall normal stability     The patient has been educated about the nature of the problem(s) and counseled on treatment options.  The patient appeared to understand what I have discussed and is in agreement with it.  Encounter Diagnosis  Name Primary?  . Chronic left-sided low back pain with left-sided sciatica Yes    PLAN Call if any problems.  Precautions discussed.  Continue current medications.   Return to clinic 2 weeks   Begin PT.  I have given pain medicine after checking the state narcotic web site.  Electronically Signed Sanjuana Kava, MD 1/16/201811:25 AM

## 2016-08-30 ENCOUNTER — Encounter: Payer: Self-pay | Admitting: Orthopaedic Surgery

## 2016-08-30 ENCOUNTER — Ambulatory Visit (INDEPENDENT_AMBULATORY_CARE_PROVIDER_SITE_OTHER): Payer: Self-pay | Admitting: Orthopaedic Surgery

## 2016-08-30 VITALS — BP 144/96 | HR 92 | Temp 97.7°F | Ht 66.0 in | Wt 165.0 lb

## 2016-08-30 DIAGNOSIS — M5442 Lumbago with sciatica, left side: Secondary | ICD-10-CM

## 2016-08-30 DIAGNOSIS — F17210 Nicotine dependence, cigarettes, uncomplicated: Secondary | ICD-10-CM

## 2016-08-30 DIAGNOSIS — G8929 Other chronic pain: Secondary | ICD-10-CM

## 2016-08-30 MED ORDER — HYDROCODONE-ACETAMINOPHEN 5-325 MG PO TABS: ORAL_TABLET | ORAL | 0 refills | Status: DC

## 2016-08-30 NOTE — Patient Instructions (Addendum)
Steps to Quit Smoking Smoking tobacco can be bad for your health. It can also affect almost every organ in your body. Smoking puts you and people around you at risk for many serious long-lasting (chronic) diseases. Quitting smoking is hard, but it is one of the best things that you can do for your health. It is never too late to quit. What are the benefits of quitting smoking? When you quit smoking, you lower your risk for getting serious diseases and conditions. They can include:  Lung cancer or lung disease.  Heart disease.  Stroke.  Heart attack.  Not being able to have children (infertility).  Weak bones (osteoporosis) and broken bones (fractures). If you have coughing, wheezing, and shortness of breath, those symptoms may get better when you quit. You may also get sick less often. If you are pregnant, quitting smoking can help to lower your chances of having a baby of low birth weight. What can I do to help me quit smoking? Talk with your doctor about what can help you quit smoking. Some things you can do (strategies) include:  Quitting smoking totally, instead of slowly cutting back how much you smoke over a period of time.  Going to in-person counseling. You are more likely to quit if you go to many counseling sessions.  Using resources and support systems, such as:  Online chats with a counselor.  Phone quitlines.  Printed self-help materials.  Support groups or group counseling.  Text messaging programs.  Mobile phone apps or applications.  Taking medicines. Some of these medicines may have nicotine in them. If you are pregnant or breastfeeding, do not take any medicines to quit smoking unless your doctor says it is okay. Talk with your doctor about counseling or other things that can help you. Talk with your doctor about using more than one strategy at the same time, such as taking medicines while you are also going to in-person counseling. This can help make quitting  easier. What things can I do to make it easier to quit? Quitting smoking might feel very hard at first, but there is a lot that you can do to make it easier. Take these steps:  Talk to your family and friends. Ask them to support and encourage you.  Call phone quitlines, reach out to support groups, or work with a counselor.  Ask people who smoke to not smoke around you.  Avoid places that make you want (trigger) to smoke, such as:  Bars.  Parties.  Smoke-break areas at work.  Spend time with people who do not smoke.  Lower the stress in your life. Stress can make you want to smoke. Try these things to help your stress:  Getting regular exercise.  Deep-breathing exercises.  Yoga.  Meditating.  Doing a body scan. To do this, close your eyes, focus on one area of your body at a time from head to toe, and notice which parts of your body are tense. Try to relax the muscles in those areas.  Download or buy apps on your mobile phone or tablet that can help you stick to your quit plan. There are many free apps, such as QuitGuide from the CDC (Centers for Disease Control and Prevention). You can find more support from smokefree.gov and other websites. This information is not intended to replace advice given to you by your health care provider. Make sure you discuss any questions you have with your health care provider. Document Released: 05/14/2009 Document Revised: 03/15/2016 Document   Reviewed: 12/02/2014 Elsevier Interactive Patient Education  2017 [place].  Back Exercises Introduction If you have pain in your back, do these exercises 2-3 times each day or as told by your doctor. When the pain goes away, do the exercises once each day, but repeat the steps more times for each exercise (do more repetitions). If you do not have pain in your back, do these exercises once each day or as told by your doctor. Exercises Single Knee to Chest  Do these steps 3-5 times in a row for  each leg: 1. Lie on your back on a firm bed or the floor with your legs stretched out. 2. Bring one knee to your chest. 3. Hold your knee to your chest by grabbing your knee or thigh. 4. Pull on your knee until you feel a gentle stretch in your lower back. 5. Keep doing the stretch for 10-30 seconds. 6. Slowly let go of your leg and straighten it. Pelvic Tilt  Do these steps 5-10 times in a row: 1. Lie on your back on a firm bed or the floor with your legs stretched out. 2. Bend your knees so they point up to the ceiling. Your feet should be flat on the floor. 3. Tighten your lower belly (abdomen) muscles to press your lower back against the floor. This will make your tailbone point up to the ceiling instead of pointing down to your feet or the floor. 4. Stay in this position for 5-10 seconds while you gently tighten your muscles and breathe evenly. Cat-Cow  Do these steps until your lower back bends more easily: 1. Get on your hands and knees on a firm surface. Keep your hands under your shoulders, and keep your knees under your hips. You may put padding under your knees. 2. Let your head hang down, and make your tailbone point down to the floor so your lower back is round like the back of a cat. 3. Stay in this position for 5 seconds. 4. Slowly lift your head and make your tailbone point up to the ceiling so your back hangs low (sags) like the back of a cow. 5. Stay in this position for 5 seconds. Press-Ups  Do these steps 5-10 times in a row: 1. Lie on your belly (face-down) on the floor. 2. Place your hands near your head, about shoulder-width apart. 3. While you keep your back relaxed and keep your hips on the floor, slowly straighten your arms to raise the top half of your body and lift your shoulders. Do not use your back muscles. To make yourself more comfortable, you may change where you place your hands. 4. Stay in this position for 5 seconds. 5. Slowly return to lying flat on the  floor. Bridges  Do these steps 10 times in a row: 1. Lie on your back on a firm surface. 2. Bend your knees so they point up to the ceiling. Your feet should be flat on the floor. 3. Tighten your butt muscles and lift your butt off of the floor until your waist is almost as high as your knees. If you do not feel the muscles working in your butt and the back of your thighs, slide your feet 1-2 inches farther away from your butt. 4. Stay in this position for 3-5 seconds. 5. Slowly lower your butt to the floor, and let your butt muscles relax. If this exercise is too easy, try doing it with your arms crossed over your chest. Belly  Crunches  Do these steps 5-10 times in a row: 1. Lie on your back on a firm bed or the floor with your legs stretched out. 2. Bend your knees so they point up to the ceiling. Your feet should be flat on the floor. 3. Cross your arms over your chest. 4. Tip your chin a little bit toward your chest but do not bend your neck. 5. Tighten your belly muscles and slowly raise your chest just enough to lift your shoulder blades a tiny bit off of the floor. 6. Slowly lower your chest and your head to the floor. Back Lifts  Do these steps 5-10 times in a row: 1. Lie on your belly (face-down) with your arms at your sides, and rest your forehead on the floor. 2. Tighten the muscles in your legs and your butt. 3. Slowly lift your chest off of the floor while you keep your hips on the floor. Keep the back of your head in line with the curve in your back. Look at the floor while you do this. 4. Stay in this position for 3-5 seconds. 5. Slowly lower your chest and your face to the floor. Contact a doctor if:  Your back pain gets a lot worse when you do an exercise.  Your back pain does not lessen 2 hours after you exercise. If you have any of these problems, stop doing the exercises. Do not do them again unless your doctor says it is okay. Get help right away if:  You have  sudden, very bad back pain. If this happens, stop doing the exercises. Do not do them again unless your doctor says it is okay. This information is not intended to replace advice given to you by your health care provider. Make sure you discuss any questions you have with your health care provider. Document Released: 08/20/2010 Document Revised: 12/24/2015 Document Reviewed: 09/11/2014  2017 Elsevier

## 2016-08-30 NOTE — Progress Notes (Signed)
Patient [id number] Kevin [name]., male DOB:[birth year], 51 y.o. [id number]  Chief Complaint  Patient presents with  . Follow-up    LOW BACK PAIN    HPI  Kevin [name]. is a 51 y.o. male who has chronic lower back pain. He could not go to PT as he has Medicaid.  He has been trying to do some exercises at home.  He is no worse.  I will give print out from here for back exercises.  He has no new trauma.  He has left sided sciatica. HPI  Body mass index is 26.63 kg/m.  ROS  Review of Systems  Constitutional:       He does smoke  HENT: Negative for congestion.   Respiratory: Negative for cough and shortness of breath.   Cardiovascular: Negative for chest pain and leg swelling.  Endocrine: Positive for cold intolerance.  Musculoskeletal: Positive for arthralgias, back pain, gait problem and joint swelling.  Allergic/Immunologic: Positive for environmental allergies.  Neurological: Positive for numbness and headaches.    Past Medical History:  Diagnosis Date  . Anxiety   . Arthritis   . Cancer ([place])   . Depression   . Former consumption of alcohol    quit 2013  . Hypertension   . Insomnia   . Left renal mass 2012   renal cell carcinoma, completely excised  . Migraines     Past Surgical History:  Procedure Laterality Date  . MANDIBLE FRACTURE SURGERY  age 68  . ROBOTIC ASSITED PARTIAL NEPHRECTOMY Left 2012  . TOTAL HIP ARTHROPLASTY Left 04/29/2016   Procedure: LEFT TOTAL HIP ARTHROPLASTY ANTERIOR APPROACH;  Surgeon: Mcarthur Rossetti, MD;  Location: [facility];  Service: Orthopedics;  Laterality: Left;    Family History  Problem Relation Age of Onset  . Cancer Mother     Social History Social History  Substance Use Topics  . Smoking status: Current Every Day Smoker    Packs/day: 0.50    Years: 30.00    Types: Cigarettes, E-cigarettes  . Smokeless tobacco: Never Used  . Alcohol use No     Comment: used to/quit 2013    No Known Allergies  Current  Outpatient Prescriptions  Medication Sig Dispense Refill  . albuterol (PROVENTIL HFA;VENTOLIN HFA) 108 (90 Base) MCG/ACT inhaler Inhale 1-2 puffs into the lungs every 4 (four) hours as needed for wheezing or shortness of breath. (Patient not taking: Reported on 04/25/2016) 1 Inhaler 0  . aspirin 81 MG chewable tablet Chew 1 tablet (81 mg total) by mouth 2 (two) times daily. 30 tablet 0  . carisoprodol (SOMA) 350 MG tablet Take 1 tablet (350 mg total) by mouth 3 (three) times daily. 90 tablet 3  . cyclobenzaprine (FLEXERIL) 10 MG tablet Take 1 tablet (10 mg total) by mouth 2 (two) times daily as needed for muscle spasms. 20 tablet 0  . gabapentin (NEURONTIN) 300 MG capsule Take 1 capsule (300 mg total) by mouth 3 (three) times daily. 40 capsule 0  . HYDROcodone-acetaminophen (NORCO/VICODIN) 5-325 MG tablet One tablet by mouth every six hours as needed for pain.  Seven day limit per Medicaid guidelines. 28 tablet 0  . hydrOXYzine (ATARAX/VISTARIL) 25 MG tablet Take 1 tablet (25 mg total) by mouth every 6 (six) hours as needed for anxiety. (Patient not taking: Reported on 04/25/2016) 20 tablet 0  . LORazepam (ATIVAN) 0.5 MG tablet Take 0.5 mg by mouth 3 (three) times daily.    . metoprolol tartrate (LOPRESSOR) 25 MG tablet Take  25 mg by mouth 2 (two) times daily.    . ondansetron (ZOFRAN ODT) 4 MG disintegrating tablet 4mg  ODT q4 hours prn nausea/vomit 12 tablet 0  . pantoprazole (PROTONIX) 20 MG tablet Take 1 tablet (20 mg total) by mouth daily. 30 tablet 0  . predniSONE (STERAPRED UNI-PAK 21 TAB) 5 MG (21) TBPK tablet Take 6 pills first day; 5 pills second day; 4 pills third day; 3 pills fourth day; 2 pills next day and 1 pill last day. 21 tablet 0  . sertraline (ZOLOFT) 50 MG tablet Take 50 mg by mouth every morning.     No current facility-administered medications for this visit.      Physical Exam  Blood pressure (!) 144/96, pulse 92, temperature 97.7 F (36.5 C), height 5\' 6"  (1.676 m),  weight 165 lb (74.8 kg).  Constitutional: overall normal hygiene, normal nutrition, well developed, normal grooming, normal body habitus. Assistive device:cane  Musculoskeletal: gait and station Limp left, muscle tone and strength are normal, no tremors or atrophy is present.  .  Neurological: coordination overall normal.  Deep tendon reflex/nerve stretch intact.  Sensation normal.  Cranial nerves II-XII intact.   Skin:   Normal overall no scars, lesions, ulcers or rashes. No psoriasis.  Psychiatric: Alert and oriented x 3.  Recent memory intact, remote memory unclear.  Normal mood and affect. Well groomed.  Good eye contact.  Cardiovascular: overall no swelling, no varicosities, no edema bilaterally, normal temperatures of the legs and arms, no clubbing, cyanosis and good capillary refill.  Lymphatic: palpation is normal.  Spine/Pelvis examination:  Inspection:  Overall, sacoiliac joint benign and hips nontender; without crepitus or defects.   Thoracic spine inspection: Alignment normal without kyphosis present   Lumbar spine inspection:  Alignment  with normal lumbar lordosis, without scoliosis apparent.   Thoracic spine palpation:  without tenderness of spinal processes   Lumbar spine palpation: with tenderness of lumbar area; without tightness of lumbar muscles    Range of Motion:   Lumbar flexion, forward flexion is 35 without pain or tenderness    Lumbar extension is 10 without pain or tenderness   Left lateral bend is Normal  without pain or tenderness   Right lateral bend is Normal without pain or tenderness   Straight leg raising is Normal   Strength & tone: Normal   Stability overall normal stability   He is still smoking and trying to cut back.  The patient has been educated about the nature of the problem(s) and counseled on treatment options.  The patient appeared to understand what I have discussed and is in agreement with it.  Encounter Diagnoses  Name  Primary?  . Chronic left-sided low back pain with left-sided sciatica Yes  . Cigarette nicotine dependence without complication     PLAN Call if any problems.  Precautions discussed.  Continue current medications.   Return to clinic 2 weeks   I have reviewed the [place] web site prior to prescribing narcotic medicine for this patient.  Electronically Signed Sanjuana Kava, MD 1/30/201810:24 AM

## 2016-09-13 ENCOUNTER — Ambulatory Visit: Payer: Medicaid Other | Admitting: Orthopaedic Surgery

## 2016-09-20 ENCOUNTER — Telehealth: Payer: Self-pay | Admitting: Orthopaedic Surgery

## 2016-09-20 MED ORDER — HYDROCODONE-ACETAMINOPHEN 5-325 MG PO TABS: 1.0000 | ORAL_TABLET | Freq: Four times a day (QID) | ORAL | 0 refills | Status: DC | PRN

## 2016-09-20 NOTE — Telephone Encounter (Signed)
Patient requests refill on Hydrocodone/Acetaminophen 5-325  Mgs.  Sig: One tablet by mouth every six hours as needed for pain   TO BE NOTED:  Patient does not have MEDICAID He has no insurance at this time.

## 2016-09-27 ENCOUNTER — Ambulatory Visit: Payer: Self-pay | Admitting: Orthopaedic Surgery

## 2016-10-03 ENCOUNTER — Emergency Department (HOSPITAL_COMMUNITY)
Admission: EM | Admit: 2016-10-03 | Discharge: 2016-10-04 | Disposition: A | Discharge status: Home / Self Care | Payer: Medicaid Other

## 2016-10-03 NOTE — ED Notes (Signed)
Called x 1 with no answer. 

## 2016-10-04 NOTE — ED Notes (Signed)
Called no answer; registration states pt was seen leaving the department and did not come back in

## 2016-10-05 ENCOUNTER — Other Ambulatory Visit: Payer: Self-pay | Admitting: Orthopedic Surgery

## 2016-10-05 DIAGNOSIS — M5442 Lumbago with sciatica, left side: Secondary | ICD-10-CM

## 2016-10-05 NOTE — Telephone Encounter (Signed)
Patient needs refill on Norco 5

## 2016-10-10 NOTE — Telephone Encounter (Signed)
Not recommended.

## 2016-10-12 ENCOUNTER — Telehealth: Payer: Self-pay | Admitting: Orthopaedic Surgery

## 2016-10-12 ENCOUNTER — Ambulatory Visit: Payer: Self-pay | Admitting: Orthopaedic Surgery

## 2016-10-12 NOTE — Telephone Encounter (Signed)
Patient called regarding status of refill request.  This note indicates that it came through interface, to Dr Aline Brochure; patient sees Dr Luna Glasgow, and has appointment today, 10/11/16. Called back to patient, left voice message.

## 2016-10-12 NOTE — Telephone Encounter (Signed)
No this week. He had some pain medicine, a few, from another doctor.  Must wait another week.

## 2016-10-12 NOTE — Telephone Encounter (Signed)
Hydrocodone-Acetaminophen  5/325mg   Qty 50 Tablets       Patient has no insurance  Patient was scheduled to come in today, but stated he was having transportation issues. He is rescheduled to 3/20.

## 2016-10-13 NOTE — Telephone Encounter (Signed)
Notified patient, per Dr Luna Glasgow.  Will discuss at his appointment 10/18/16.

## 2016-10-18 ENCOUNTER — Ambulatory Visit (INDEPENDENT_AMBULATORY_CARE_PROVIDER_SITE_OTHER): Payer: Self-pay | Admitting: Orthopaedic Surgery

## 2016-10-18 VITALS — BP 128/74 | HR 92 | Temp 99.0°F | Ht 66.0 in | Wt 156.0 lb

## 2016-10-18 DIAGNOSIS — F17210 Nicotine dependence, cigarettes, uncomplicated: Secondary | ICD-10-CM

## 2016-10-18 DIAGNOSIS — M5442 Lumbago with sciatica, left side: Secondary | ICD-10-CM

## 2016-10-18 DIAGNOSIS — G8929 Other chronic pain: Secondary | ICD-10-CM

## 2016-10-18 MED ORDER — HYDROCODONE-ACETAMINOPHEN 5-325 MG PO TABS: 1.0000 | ORAL_TABLET | Freq: Four times a day (QID) | ORAL | 0 refills | Status: DC | PRN

## 2016-10-18 MED ORDER — NAPROXEN 500 MG PO TABS: 500.0000 mg | ORAL_TABLET | Freq: Two times a day (BID) | ORAL | 5 refills | Status: DC

## 2016-10-18 NOTE — Patient Instructions (Signed)
Steps to Quit Smoking Smoking tobacco can be bad for your health. It can also affect almost every organ in your body. Smoking puts you and people around you at risk for many serious Kevin [name]-lasting (chronic) diseases. Quitting smoking is hard, but it is one of the best things that you can do for your health. It is never too late to quit. What are the benefits of quitting smoking? When you quit smoking, you lower your risk for getting serious diseases and conditions. They can include:  Lung cancer or lung disease.  Heart disease.  Stroke.  Heart attack.  Not being able to have children (infertility).  Weak bones (osteoporosis) and broken bones (fractures). If you have coughing, wheezing, and shortness of breath, those symptoms may get better when you quit. You may also get sick less often. If you are pregnant, quitting smoking can help to lower your chances of having a baby of low birth weight. What can I do to help me quit smoking? Talk with your doctor about what can help you quit smoking. Some things you can do (strategies) include:  Quitting smoking totally, instead of slowly cutting back how much you smoke over a period of time.  Going to in-person counseling. You are more likely to quit if you go to many counseling sessions.  Using resources and support systems, such as:  Online chats with a counselor.  Phone quitlines.  Printed self-help materials.  Support groups or group counseling.  Text messaging programs.  Mobile phone apps or applications.  Taking medicines. Some of these medicines may have nicotine in them. If you are pregnant or breastfeeding, do not take any medicines to quit smoking unless your doctor says it is okay. Talk with your doctor about counseling or other things that can help you. Talk with your doctor about using more than one strategy at the same time, such as taking medicines while you are also going to in-person counseling. This can help make quitting  easier. What things can I do to make it easier to quit? Quitting smoking might feel very hard at first, but there is a lot that you can do to make it easier. Take these steps:  Talk to your family and friends. Ask them to support and encourage you.  Call phone quitlines, reach out to support groups, or work with a counselor.  Ask people who smoke to not smoke around you.  Avoid places that make you want (trigger) to smoke, such as:  Bars.  Parties.  Smoke-break areas at work.  Spend time with people who do not smoke.  Lower the stress in your life. Stress can make you want to smoke. Try these things to help your stress:  Getting regular exercise.  Deep-breathing exercises.  Yoga.  Meditating.  Doing a body scan. To do this, close your eyes, focus on one area of your body at a time from head to toe, and notice which parts of your body are tense. Try to relax the muscles in those areas.  Download or buy apps on your mobile phone or tablet that can help you stick to your quit plan. There are many free apps, such as QuitGuide from the CDC (Centers for Disease Control and Prevention). You can find more support from smokefree.gov and other websites. This information is not intended to replace advice given to you by your health care provider. Make sure you discuss any questions you have with your health care provider. Document Released: 05/14/2009 Document Revised: 03/15/2016 Document   Reviewed: 12/02/2014 Elsevier Interactive Patient Education  2017 Else[employer] Inc.  

## 2016-10-18 NOTE — Progress Notes (Signed)
Patient Kevin L Leane Platt., Kevin [name] DOB:[birth year], 51 y.o. [id number]  Chief Complaint  Patient presents with  . Follow-up    Low back pain    HPI  Kevin [name]. is a 51 y.o. Kevin [name] who has chronic lower back pain.  He has no new trauma.  He has some left sided paresthesias at times.  He has had left hip surgery in September of 2017 and has some left knee pain as well.  He has lost his insurance.  I have gone over exercises with him and recommended he join the [facility]. HPI  Body mass index is 25.18 kg/m.  ROS  Review of Systems  Constitutional:       He does smoke  HENT: Negative for congestion.   Respiratory: Negative for cough and shortness of breath.   Cardiovascular: Negative for chest pain and leg swelling.  Endocrine: Positive for cold intolerance.  Musculoskeletal: Positive for arthralgias, back pain, gait problem and joint swelling.  Allergic/Immunologic: Positive for environmental allergies.  Neurological: Positive for numbness and headaches.    Past Medical History:  Diagnosis Date  . Anxiety   . Arthritis   . Cancer ([place])   . Depression   . Former consumption of alcohol    quit 2013  . Hypertension   . Insomnia   . Left renal mass 2012   renal cell carcinoma, completely excised  . Migraines     Past Surgical History:  Procedure Laterality Date  . MANDIBLE FRACTURE SURGERY  age 75  . ROBOTIC ASSITED PARTIAL NEPHRECTOMY Left 2012  . TOTAL HIP ARTHROPLASTY Left 04/29/2016   Procedure: LEFT TOTAL HIP ARTHROPLASTY ANTERIOR APPROACH;  Surgeon: Mcarthur Rossetti, MD;  Location: [facility];  Service: Orthopedics;  Laterality: Left;    Family History  Problem Relation Age of Onset  . Cancer Mother     Social History Social History  Substance Use Topics  . Smoking status: Current Every Day Smoker    Packs/day: 0.50    Years: 30.00    Types: Cigarettes, E-cigarettes  . Smokeless tobacco: Never Used  . Alcohol use No     Comment: used to/quit  2013    No Known Allergies  Current Outpatient Prescriptions  Medication Sig Dispense Refill  . albuterol (PROVENTIL HFA;VENTOLIN HFA) 108 (90 Base) MCG/ACT inhaler Inhale 1-2 puffs into the lungs every 4 (four) hours as needed for wheezing or shortness of breath. (Patient not taking: Reported on 04/25/2016) 1 Inhaler 0  . aspirin 81 MG chewable tablet Chew 1 tablet (81 mg total) by mouth 2 (two) times daily. 30 tablet 0  . carisoprodol (SOMA) 350 MG tablet Take 1 tablet (350 mg total) by mouth 3 (three) times daily. 90 tablet 3  . cyclobenzaprine (FLEXERIL) 10 MG tablet Take 1 tablet (10 mg total) by mouth 2 (two) times daily as needed for muscle spasms. 20 tablet 0  . gabapentin (NEURONTIN) 300 MG capsule Take 1 capsule (300 mg total) by mouth 3 (three) times daily. 40 capsule 0  . HYDROcodone-acetaminophen (NORCO/VICODIN) 5-325 MG tablet Take 1 tablet by mouth every 6 (six) hours as needed for moderate pain (Must last 30 days.Do not take and drive a car or use machinery.). 110 tablet 0  . hydrOXYzine (ATARAX/VISTARIL) 25 MG tablet Take 1 tablet (25 mg total) by mouth every 6 (six) hours as needed for anxiety. (Patient not taking: Reported on 04/25/2016) 20 tablet 0  . LORazepam (ATIVAN) 0.5 MG tablet Take 0.5 mg by mouth  3 (three) times daily.    . metoprolol tartrate (LOPRESSOR) 25 MG tablet Take 25 mg by mouth 2 (two) times daily.    . naproxen (NAPROSYN) 500 MG tablet Take 1 tablet (500 mg total) by mouth 2 (two) times daily with a meal. 60 tablet 5  . ondansetron (ZOFRAN ODT) 4 MG disintegrating tablet 4mg  ODT q4 hours prn nausea/vomit 12 tablet 0  . pantoprazole (PROTONIX) 20 MG tablet Take 1 tablet (20 mg total) by mouth daily. 30 tablet 0  . predniSONE (STERAPRED UNI-PAK 21 TAB) 5 MG (21) TBPK tablet Take 6 pills first day; 5 pills second day; 4 pills third day; 3 pills fourth day; 2 pills next day and 1 pill last day. 21 tablet 0  . sertraline (ZOLOFT) 50 MG tablet Take 50 mg by mouth  every morning.     No current facility-administered medications for this visit.      Physical Exam  Blood pressure 128/74, pulse 92, temperature 99 F (37.2 C), height 5\' 6"  (1.676 m), weight 156 lb (70.8 kg).  Constitutional: overall normal hygiene, normal nutrition, well developed, normal grooming, normal body habitus. Assistive device:cane  Musculoskeletal: gait and station Limp left, muscle tone and strength are normal, no tremors or atrophy is present.  .  Neurological: coordination overall normal.  Deep tendon reflex/nerve stretch intact.  Sensation normal.  Cranial nerves II-XII intact.   Skin:   Normal overall no scars, lesions, ulcers or rashes. No psoriasis.  Psychiatric: Alert and oriented x 3.  Recent memory intact, remote memory unclear.  Normal mood and affect. Well groomed.  Good eye contact.  Cardiovascular: overall no swelling, no varicosities, no edema bilaterally, normal temperatures of the legs and arms, no clubbing, cyanosis and good capillary refill.  Lymphatic: palpation is normal.  Spine/Pelvis examination:  Inspection:  Overall, sacoiliac joint benign and hips nontender; without crepitus or defects.   Thoracic spine inspection: Alignment normal without kyphosis present   Lumbar spine inspection:  Alignment  with normal lumbar lordosis, without scoliosis apparent.   Thoracic spine palpation:  without tenderness of spinal processes   Lumbar spine palpation: with tenderness of lumbar area; without tightness of lumbar muscles    Range of Motion:   Lumbar flexion, forward flexion is 45 without pain or tenderness    Lumbar extension is 5 without pain or tenderness   Left lateral bend is Normal  without pain or tenderness   Right lateral bend is Normal without pain or tenderness   Straight leg raising is Normal   Strength & tone: Normal   Stability overall normal stability     The patient has been educated about the nature of the problem(s) and  counseled on treatment options.  The patient appeared to understand what I have discussed and is in agreement with it.  Encounter Diagnoses  Name Primary?  . Chronic left-sided low back pain with left-sided sciatica Yes  . Cigarette nicotine dependence without complication     PLAN Call if any problems.  Precautions discussed.  Continue current medications.   Return to clinic 3 months   I have reviewed the [place] web site prior to prescribing narcotic medicine for this patient.  Electronically Signed Sanjuana Kava, MD 3/20/20183:23 PM

## 2016-11-15 ENCOUNTER — Telehealth: Payer: Self-pay | Admitting: Orthopaedic Surgery

## 2016-11-15 MED ORDER — HYDROCODONE-ACETAMINOPHEN 5-325 MG PO TABS: 1.0000 | ORAL_TABLET | Freq: Four times a day (QID) | ORAL | 0 refills | Status: DC | PRN

## 2016-12-04 ENCOUNTER — Emergency Department (HOSPITAL_COMMUNITY)
Admission: EM | Admit: 2016-12-04 | Discharge: 2016-12-04 | Disposition: A | Discharge status: Home / Self Care | Payer: Medicaid Other | Attending: Emergency Medicine | Admitting: Emergency Medicine

## 2016-12-04 ENCOUNTER — Encounter (HOSPITAL_COMMUNITY): Payer: Self-pay | Admitting: Emergency Medicine

## 2016-12-04 DIAGNOSIS — M5442 Lumbago with sciatica, left side: Secondary | ICD-10-CM | POA: Insufficient documentation

## 2016-12-04 DIAGNOSIS — I10 Essential (primary) hypertension: Secondary | ICD-10-CM | POA: Insufficient documentation

## 2016-12-04 DIAGNOSIS — M5441 Lumbago with sciatica, right side: Secondary | ICD-10-CM | POA: Insufficient documentation

## 2016-12-04 DIAGNOSIS — G8929 Other chronic pain: Secondary | ICD-10-CM

## 2016-12-04 DIAGNOSIS — Z79899 Other long term (current) drug therapy: Secondary | ICD-10-CM | POA: Insufficient documentation

## 2016-12-04 DIAGNOSIS — F17210 Nicotine dependence, cigarettes, uncomplicated: Secondary | ICD-10-CM | POA: Insufficient documentation

## 2016-12-04 DIAGNOSIS — Z7982 Long term (current) use of aspirin: Secondary | ICD-10-CM | POA: Insufficient documentation

## 2016-12-04 MED ORDER — HYDROCODONE-ACETAMINOPHEN 5-325 MG PO TABS: 1.0000 | ORAL_TABLET | Freq: Four times a day (QID) | ORAL | 0 refills | Status: DC | PRN

## 2016-12-04 NOTE — Discharge Instructions (Signed)
Call Dr. Brooke Bonito office on Monday. For consideration for MRI. Take pain medication as directed. Return for increased lower extremity weakness or paralysis or any incontinence or difficulty urinating.

## 2016-12-04 NOTE — ED Triage Notes (Signed)
Pt reports joint pain in his back and legs. Pt describes the pain as "feels like when I had mono". Pt denies any injuries but reports chronic back issues and left hip replacement. Pt denies fevers, reports generalized weakness, fatigue.

## 2016-12-04 NOTE — ED Provider Notes (Signed)
[place] DEPT Provider Note   CSN: [id number] Arrival date & time: 12/04/16  1647     History   Chief Complaint Chief Complaint  Patient presents with  . Joint Pain  . Weakness    HPI Kevin [name]. is a 51 y.o. male.  Patient with long-standing low back pain problems. Followed by Dr. Luna Glasgow orthopedics locally. Patient here today with complaint of pain going down both legs posteriorly and some increased weakness from his baseline pain. I had some concerns that may be had a systemic illness. He has no symptoms above the waist. No difficulty urinating no incontinence. No significant numbness. Able to move both lower extremities. No falls or recent injury.      Past Medical History:  Diagnosis Date  . Anxiety   . Arthritis   . Cancer ([place])   . Depression   . Former consumption of alcohol    quit 2013  . Hypertension   . Insomnia   . Left renal mass 2012   renal cell carcinoma, completely excised  . Migraines     Patient Active Problem List   Diagnosis Date Noted  . Osteoarthritis of left hip 04/29/2016  . Status post left hip replacement 04/29/2016    Past Surgical History:  Procedure Laterality Date  . MANDIBLE FRACTURE SURGERY  age 58  . ROBOTIC ASSITED PARTIAL NEPHRECTOMY Left 2012  . TOTAL HIP ARTHROPLASTY Left 04/29/2016   Procedure: LEFT TOTAL HIP ARTHROPLASTY ANTERIOR APPROACH;  Surgeon: Mcarthur Rossetti, MD;  Location: [facility];  Service: Orthopedics;  Laterality: Left;       Home Medications    Prior to Admission medications   Medication Sig Start Date End Date Taking? Authorizing Provider  albuterol (PROVENTIL HFA;VENTOLIN HFA) 108 (90 Base) MCG/ACT inhaler Inhale 1-2 puffs into the lungs every 4 (four) hours as needed for wheezing or shortness of breath. Patient not taking: Reported on 04/25/2016 12/24/15   Margarita Mail, PA-C  aspirin 81 MG chewable tablet Chew 1 tablet (81 mg total) by mouth 2 (two) times daily. 05/01/16    Mcarthur Rossetti, MD  carisoprodol (SOMA) 350 MG tablet Take 1 tablet (350 mg total) by mouth 3 (three) times daily. 08/08/16   Carole Civil, MD  cyclobenzaprine (FLEXERIL) 10 MG tablet Take 1 tablet (10 mg total) by mouth 2 (two) times daily as needed for muscle spasms. 08/12/16   Ashley Murrain, NP  gabapentin (NEURONTIN) 300 MG capsule Take 1 capsule (300 mg total) by mouth 3 (three) times daily. 05/02/16   Mcarthur Rossetti, MD  HYDROcodone-acetaminophen (NORCO/VICODIN) 5-325 MG tablet Take 1 tablet by mouth every 6 (six) hours as needed for moderate pain (Must last 30 days.Do not take and drive a car or use machinery.). 11/15/16   Sanjuana Kava, MD  HYDROcodone-acetaminophen (NORCO/VICODIN) 5-325 MG tablet Take 1-2 tablets by mouth every 6 (six) hours as needed. 12/04/16   Fredia Sorrow, MD  hydrOXYzine (ATARAX/VISTARIL) 25 MG tablet Take 1 tablet (25 mg total) by mouth every 6 (six) hours as needed for anxiety. Patient not taking: Reported on 04/25/2016 02/21/16   [name], MD  LORazepam (ATIVAN) 0.5 MG tablet Take 0.5 mg by mouth 3 (three) times daily.    [provider]  metoprolol tartrate (LOPRESSOR) 25 MG tablet Take 25 mg by mouth 2 (two) times daily.    [provider]  naproxen (NAPROSYN) 500 MG tablet Take 1 tablet (500 mg total) by mouth 2 (two) times daily with  a meal. 10/18/16   Sanjuana Kava, MD  ondansetron (ZOFRAN ODT) 4 MG disintegrating tablet 4mg  ODT q4 hours prn nausea/vomit 08/06/16   Milton Ferguson, MD  pantoprazole (PROTONIX) 20 MG tablet Take 1 tablet (20 mg total) by mouth daily. 08/06/16   Milton Ferguson, MD  predniSONE (STERAPRED UNI-PAK 21 TAB) 5 MG (21) TBPK tablet Take 6 pills first day; 5 pills second day; 4 pills third day; 3 pills fourth day; 2 pills next day and 1 pill last day. 08/16/16   Sanjuana Kava, MD  sertraline (ZOLOFT) 50 MG tablet Take 50 mg by mouth every morning.    [provider]    Family  History Family History  Problem Relation Age of Onset  . Cancer Mother     Social History Social History  Substance Use Topics  . Smoking status: Current Every Day Smoker    Packs/day: 0.50    Years: 30.00    Types: Cigarettes, E-cigarettes  . Smokeless tobacco: Never Used  . Alcohol use No     Comment: used to/quit 2013     Allergies   Patient has no known allergies.   Review of Systems Review of Systems  Constitutional: Negative for fever.  Eyes: Negative for redness.  Respiratory: Negative for shortness of breath.   Cardiovascular: Negative for chest pain.  Gastrointestinal: Negative for abdominal pain.  Genitourinary: Negative for difficulty urinating.  Musculoskeletal: Positive for back pain.  Skin: Negative for rash.  Neurological: Positive for weakness. Negative for numbness and headaches.  Hematological: Does not bruise/bleed easily.  Psychiatric/Behavioral: Negative for confusion.     Physical Exam Updated Vital Signs BP 120/78 (BP Location: Right Arm)   Pulse 65   Temp 98.1 F (36.7 C) (Oral)   Resp 17   Wt 70.8 kg   SpO2 99%   BMI 25.18 kg/m   Physical Exam  Constitutional: He is oriented to person, place, and time. He appears well-developed and well-nourished. No distress.  HENT:  Head: Normocephalic and atraumatic.  Mouth/Throat: Oropharynx is clear and moist.  Eyes: EOM are normal. Pupils are equal, round, and reactive to light.  Cardiovascular: Normal rate, regular rhythm and normal heart sounds.   Pulmonary/Chest: Effort normal and breath sounds normal. No respiratory distress.  Abdominal: Soft. Bowel sounds are normal. There is no tenderness.  Musculoskeletal: Normal range of motion. He exhibits no edema.  Neurological: He is alert and oriented to person, place, and time. No cranial nerve deficit or sensory deficit. He exhibits normal muscle tone. Coordination normal.  Gross motor strength intact to both legs. No significance sensory  changes.  Nursing note and vitals reviewed.    ED Treatments / Results  Labs (all labs ordered are listed, but only abnormal results are displayed) Labs Reviewed - No data to display  EKG  EKG Interpretation None       Radiology No results found.  Procedures Procedures (including critical care time)  Medications Ordered in ED Medications - No data to display   Initial Impression / Assessment and Plan / ED Course  I have reviewed the triage vital signs and the nursing notes.  Pertinent labs & imaging results that were available during my care of the patient were reviewed by me and considered in my medical decision making (see chart for details).    [employer] reviewed. Patient last received prescription for pain medication April 17. Designated to be a 30 day supply. Little less than a month. We'll give him a prescription  for 10 tablets until he can follow back up with his orthopedic doctor.  Patient with history of chronic back pain. Seems to be happening a worsening condition and worries getting sciatic type pain down the back of both legs. No significant paralysis or weakness. No incontinence. Patient with good range of motion of the feet and knees. Distally intact.  MRI not available here today. But patient may benefit from having an MRI done as an outpatient. No acute emergency requiring MRI today.   Final Clinical Impressions(s) / ED Diagnoses   Final diagnoses:  Chronic bilateral low back pain with bilateral sciatica    New Prescriptions New Prescriptions   HYDROCODONE-ACETAMINOPHEN (NORCO/VICODIN) 5-325 MG TABLET    Take 1-2 tablets by mouth every 6 (six) hours as needed.     Fredia Sorrow, MD 12/04/16 [phone]

## 2016-12-10 ENCOUNTER — Other Ambulatory Visit: Payer: Self-pay | Admitting: Orthopedic Surgery

## 2016-12-10 DIAGNOSIS — M5442 Lumbago with sciatica, left side: Secondary | ICD-10-CM

## 2016-12-13 ENCOUNTER — Telehealth: Payer: Self-pay | Admitting: Orthopaedic Surgery

## 2016-12-13 MED ORDER — HYDROCODONE-ACETAMINOPHEN 5-325 MG PO TABS: 1.0000 | ORAL_TABLET | Freq: Four times a day (QID) | ORAL | 0 refills | Status: DC | PRN

## 2016-12-21 ENCOUNTER — Encounter (HOSPITAL_COMMUNITY): Payer: Self-pay

## 2016-12-21 ENCOUNTER — Emergency Department (HOSPITAL_COMMUNITY)
Admission: EM | Admit: 2016-12-21 | Discharge: 2016-12-21 | Disposition: A | Discharge status: Home / Self Care | Payer: Medicaid Other | Attending: Emergency Medicine | Admitting: Emergency Medicine

## 2016-12-21 DIAGNOSIS — Y999 Unspecified external cause status: Secondary | ICD-10-CM | POA: Insufficient documentation

## 2016-12-21 DIAGNOSIS — Y9389 Activity, other specified: Secondary | ICD-10-CM | POA: Insufficient documentation

## 2016-12-21 DIAGNOSIS — Y288XXA Contact with other sharp object, undetermined intent, initial encounter: Secondary | ICD-10-CM | POA: Insufficient documentation

## 2016-12-21 DIAGNOSIS — S61412A Laceration without foreign body of left hand, initial encounter: Secondary | ICD-10-CM

## 2016-12-21 DIAGNOSIS — F17210 Nicotine dependence, cigarettes, uncomplicated: Secondary | ICD-10-CM | POA: Insufficient documentation

## 2016-12-21 DIAGNOSIS — I10 Essential (primary) hypertension: Secondary | ICD-10-CM | POA: Insufficient documentation

## 2016-12-21 DIAGNOSIS — Z79899 Other long term (current) drug therapy: Secondary | ICD-10-CM | POA: Insufficient documentation

## 2016-12-21 DIAGNOSIS — Y92000 Kitchen of unspecified non-institutional (private) residence as  the place of occurrence of the external cause: Secondary | ICD-10-CM | POA: Insufficient documentation

## 2016-12-21 MED ORDER — POVIDONE-IODINE 10 % EX SOLN
CUTANEOUS | Status: AC
  Administered 2016-12-21: 21:00:00
  Filled 2016-12-21: qty 118

## 2016-12-21 MED ORDER — HYDROCODONE-ACETAMINOPHEN 5-325 MG PO TABS: 1.0000 | ORAL_TABLET | ORAL | 0 refills | Status: DC | PRN

## 2016-12-21 MED ORDER — LIDOCAINE HCL (PF) 2 % IJ SOLN
10.0000 mL | Freq: Once | INTRAMUSCULAR | Status: AC
  Administered 2016-12-21: 10 mL
  Filled 2016-12-21: qty 10

## 2016-12-21 NOTE — Discharge Instructions (Signed)
You may take the hydrocodone prescribed for pain relief.  This will make you drowsy - do not drive within 4 hours of taking this medication. Ice and elevation will also help with pain relief.

## 2016-12-21 NOTE — ED Triage Notes (Signed)
Pt reports cutting palm of left hand today at work on piece of metal. Pt reports applying super glue to laceration while at work, but now it is very sore and appears deeper than he thought, and wants to make sure he doesn't need sutures. No bleeding at this time.

## 2016-12-21 NOTE — ED Notes (Signed)
Dressing applied and secured over laceration

## 2016-12-21 NOTE — ED Provider Notes (Signed)
[place] DEPT Provider Note   CSN: [id number] Arrival date & time: 12/21/16  1934     History   Chief Complaint Chief Complaint  Patient presents with  . Extremity Laceration    HPI Kevin [name] is a 51 y.o. male presenting with Laceration to his left hand full or hand occurring 8 hours before arrival.  His hand was sliced on a sharp edge of a refrigerator that he was trying to install in the kitchen.  He endorses the wound bled copiously, he washed it out with soap and water and then attempted to use superglue to seal the site which did not hold as it continued to bleed.  He denies weakness or numbness distal to the injury site.  He is up-to-date on his tetanus.  HPI  Past Medical History:  Diagnosis Date  . Anxiety   . Arthritis   . Cancer ([place])   . Depression   . Former consumption of alcohol    quit 2013  . Hypertension   . Insomnia   . Left renal mass 2012   renal cell carcinoma, completely excised  . Migraines     Patient Active Problem List   Diagnosis Date Noted  . Osteoarthritis of left hip 04/29/2016  . Status post left hip replacement 04/29/2016    Past Surgical History:  Procedure Laterality Date  . MANDIBLE FRACTURE SURGERY  age 56  . ROBOTIC ASSITED PARTIAL NEPHRECTOMY Left 2012  . TOTAL HIP ARTHROPLASTY Left 04/29/2016   Procedure: LEFT TOTAL HIP ARTHROPLASTY ANTERIOR APPROACH;  Surgeon: Mcarthur Rossetti, MD;  Location: [facility];  Service: Orthopedics;  Laterality: Left;       Home Medications    Prior to Admission medications   Medication Sig Start Date End Date Taking? Authorizing Provider  metoprolol tartrate (LOPRESSOR) 25 MG tablet Take 25 mg by mouth 2 (two) times daily.   Yes [provider]  sertraline (ZOLOFT) 50 MG tablet Take 50 mg by mouth every morning.   Yes [provider]  HYDROcodone-acetaminophen (NORCO/VICODIN) 5-325 MG tablet Take 1 tablet by mouth every 4 (four) hours as needed. 12/21/16   Evalee Jefferson, PA-C  naproxen (NAPROSYN) 500 MG tablet Take 1 tablet (500 mg total) by mouth 2 (two) times daily with a meal. Patient not taking: Reported on 12/21/2016 10/18/16   Sanjuana Kava, MD    Family History Family History  Problem Relation Age of Onset  . Cancer Mother     Social History Social History  Substance Use Topics  . Smoking status: Current Every Day Smoker    Packs/day: 0.50    Years: 30.00    Types: Cigarettes, E-cigarettes  . Smokeless tobacco: Never Used  . Alcohol use No     Comment: used to/quit 2013     Allergies   Patient has no known allergies.   Review of Systems Review of Systems  Constitutional: Negative for chills and fever.  Skin: Positive for wound.  Neurological: Negative for weakness and numbness.  All other systems reviewed and are negative.    Physical Exam Updated Vital Signs BP 137/68 (BP Location: Right Arm)   Pulse 66   Temp 98 F (36.7 C) (Oral)   Resp 17   Ht 5\' 6"  (1.676 m)   Wt 70.8 kg (156 lb)   SpO2 96%   BMI 25.18 kg/m   Physical Exam  Constitutional: He is oriented to person, place, and time. He appears well-developed and well-nourished.  HENT:  Head:  Normocephalic.  Cardiovascular: Normal rate.   Pulmonary/Chest: Effort normal.  Musculoskeletal: He exhibits no tenderness.       Left hand: He exhibits normal range of motion, normal two-point discrimination, normal capillary refill, no deformity and no swelling. Normal sensation noted. Normal strength noted.  Neurological: He is alert and oriented to person, place, and time. No sensory deficit.  Skin: Laceration noted.  1 cm subcutaneous laceration left volar thenar eminence.  Hemostatic.  Remnants of dried superglue around the edges of the wound.     ED Treatments / Results  Labs (all labs ordered are listed, but only abnormal results are displayed) Labs Reviewed - No data to display  EKG  EKG Interpretation None       Radiology No results  found.  Procedures Procedures (including critical care time)  LACERATION REPAIR Performed by: Evalee Jefferson Authorized by: Evalee Jefferson Consent: Verbal consent obtained. Risks and benefits: risks, benefits and alternatives were discussed Consent given by: patient Patient identity confirmed: provided demographic data Prepped and Draped in normal sterile fashion Wound explored.  Wound was scrubbed with a Betadine sponge, all remnants of the superglue were removed prior to suturing.  Laceration Location: Left volar hand  Laceration Length: 2 cm  No Foreign Bodies seen or palpated  Anesthesia: local infiltration  Local anesthetic: lidocaine 1 % without epinephrine  Anesthetic total: 3 ml  Irrigation method: syringe Amount of cleaning: standard  Skin closure: Sutured with 4-0 Ethilon   Number of sutures: 4   Technique: Simple interrupted   Patient tolerance: Patient tolerated the procedure well with no immediate complications.   Medications Ordered in ED Medications  lidocaine (XYLOCAINE) 2 % injection 10 mL (10 mLs Other Given 12/21/16 2104)  povidone-iodine (BETADINE) 10 % external solution (  Given 12/21/16 2103)     Initial Impression / Assessment and Plan / ED Course  I have reviewed the triage vital signs and the nursing notes.  Pertinent labs & imaging results that were available during my care of the patient were reviewed by me and considered in my medical decision making (see chart for details).     Wound care instructions given.  Pt advised to have sutures removed in 10 days,  Return here sooner for any signs of infection including redness, swelling, worse pain or drainage of pus.     Final Clinical Impressions(s) / ED Diagnoses   Final diagnoses:  Laceration of left hand without foreign body, initial encounter    New Prescriptions New Prescriptions   HYDROCODONE-ACETAMINOPHEN (NORCO/VICODIN) 5-325 MG TABLET    Take 1 tablet by mouth every 4 (four)  hours as needed.     Evalee Jefferson, PA-C 12/21/16 2112    Fredia Sorrow, MD 12/27/16 [phone]

## 2017-01-10 ENCOUNTER — Encounter: Payer: Self-pay | Admitting: Orthopaedic Surgery

## 2017-01-10 ENCOUNTER — Ambulatory Visit (INDEPENDENT_AMBULATORY_CARE_PROVIDER_SITE_OTHER): Payer: Self-pay | Admitting: Orthopaedic Surgery

## 2017-01-10 VITALS — Ht 66.0 in | Wt 156.0 lb

## 2017-01-10 DIAGNOSIS — G8929 Other chronic pain: Secondary | ICD-10-CM

## 2017-01-10 DIAGNOSIS — M5442 Lumbago with sciatica, left side: Secondary | ICD-10-CM

## 2017-01-10 MED ORDER — HYDROCODONE-ACETAMINOPHEN 5-325 MG PO TABS: 1.0000 | ORAL_TABLET | Freq: Four times a day (QID) | ORAL | 0 refills | Status: DC | PRN

## 2017-01-10 NOTE — Progress Notes (Addendum)
Patient CV:Kevin [name], male DOB:10-17-1965, 51 y.o. BPZ:[id number]  Chief Complaint  Patient presents with  . Back Pain    HPI  Kevin [name] is a 51 y.o. male who has lower back pain.  He has left sided sciatica that is worse.  He has more and more pain.  He has no weakness. He has no bowel or bladder problems.  I would like to get a MRI but he has no insurance.  We have told him about [facility]. HPI  Body mass index is 25.18 kg/m.  ROS  Review of Systems  Constitutional:       He does smoke  HENT: Negative for congestion.   Respiratory: Negative for cough and shortness of breath.   Cardiovascular: Negative for chest pain and leg swelling.  Endocrine: Positive for cold intolerance.  Musculoskeletal: Positive for arthralgias, back pain, gait problem and joint swelling.  Allergic/Immunologic: Positive for environmental allergies.  Neurological: Positive for numbness and headaches.    Past Medical History:  Diagnosis Date  . Anxiety   . Arthritis   . Cancer ([place])   . Depression   . Former consumption of alcohol    quit 2013  . Hypertension   . Insomnia   . Left renal mass 2012   renal cell carcinoma, completely excised  . Migraines     Past Surgical History:  Procedure Laterality Date  . MANDIBLE FRACTURE SURGERY  age 26  . ROBOTIC ASSITED PARTIAL NEPHRECTOMY Left 2012  . TOTAL HIP ARTHROPLASTY Left 04/29/2016   Procedure: LEFT TOTAL HIP ARTHROPLASTY ANTERIOR APPROACH;  Surgeon: Mcarthur Rossetti, MD;  Location: [facility];  Service: Orthopedics;  Laterality: Left;    Family History  Problem Relation Age of Onset  . Cancer Mother     Social History Social History  Substance Use Topics  . Smoking status: Current Every Day Smoker    Packs/day: 0.50    Years: 30.00    Types: Cigarettes, E-cigarettes  . Smokeless tobacco: Never Used  . Alcohol use No     Comment: used to/quit 2013    No Known Allergies  Current Outpatient Prescriptions   Medication Sig Dispense Refill  . HYDROcodone-acetaminophen (NORCO/VICODIN) 5-325 MG tablet Take 1 tablet by mouth every 6 (six) hours as needed for moderate pain (Must last 30 days.Do not take and drive a car or use machinery.). 90 tablet 0  . metoprolol tartrate (LOPRESSOR) 25 MG tablet Take 25 mg by mouth 2 (two) times daily.    . naproxen (NAPROSYN) 500 MG tablet Take 1 tablet (500 mg total) by mouth 2 (two) times daily with a meal. (Patient not taking: Reported on 12/21/2016) 60 tablet 5  . sertraline (ZOLOFT) 50 MG tablet Take 50 mg by mouth every morning.     No current facility-administered medications for this visit.      Physical Exam  Height 5\' 6"  (1.676 m), weight 156 lb (70.8 kg).  Constitutional: overall normal hygiene, normal nutrition, well developed, normal grooming, normal body habitus. Assistive device:cane  Musculoskeletal: gait and station Limp left, muscle tone and strength are normal, no tremors or atrophy is present.  .  Neurological: coordination overall normal.  Deep tendon reflex/nerve stretch intact.  Sensation normal.  Cranial nerves II-XII intact.   Skin:   Normal overall no scars, lesions, ulcers or rashes. No psoriasis.  Psychiatric: Alert and oriented x 3.  Recent memory intact, remote memory unclear.  Normal mood and affect. Well groomed.  Good eye contact.  Cardiovascular: overall no swelling, no varicosities, no edema bilaterally, normal temperatures of the legs and arms, no clubbing, cyanosis and good capillary refill.  Lymphatic: palpation is normal.  Spine/Pelvis examination:  Inspection:  Overall, sacoiliac joint benign and hips nontender; without crepitus or defects.   Thoracic spine inspection: Alignment normal without kyphosis present   Lumbar spine inspection:  Alignment  with normal lumbar lordosis, without scoliosis apparent.   Thoracic spine palpation:  without tenderness of spinal processes   Lumbar spine palpation: with  tenderness of lumbar area; without tightness of lumbar muscles    Range of Motion:   Lumbar flexion, forward flexion is 45 without pain or tenderness    Lumbar extension is 10 without pain or tenderness   Left lateral bend is Normal  without pain or tenderness   Right lateral bend is Normal without pain or tenderness   Straight leg raising is Normal   Strength & tone: Normal   Stability overall normal stability     The patient has been educated about the nature of the problem(s) and counseled on treatment options.  The patient appeared to understand what I have discussed and is in agreement with it.  Encounter Diagnosis  Name Primary?  . Chronic left-sided low back pain with left-sided sciatica Yes    PLAN Call if any problems.  Precautions discussed.  Continue current medications.   Return to clinic 3 months  I have reviewed the [facility] web site prior to prescribing narcotic medicine for this patient.   Electronically Signed Sanjuana Kava, MD 6/12/20188:42 AM

## 2017-01-10 NOTE — Patient Instructions (Signed)
Steps to Quit Smoking Smoking tobacco can be bad for your health. It can also affect almost every organ in your body. Smoking puts you and people around you at risk for many serious long-lasting (chronic) diseases. Quitting smoking is hard, but it is one of the best things that you can do for your health. It is never too late to quit. What are the benefits of quitting smoking? When you quit smoking, you lower your risk for getting serious diseases and conditions. They can include:  Lung cancer or lung disease.  Heart disease.  Stroke.  Heart attack.  Not being able to have children (infertility).  Weak bones (osteoporosis) and broken bones (fractures).  If you have coughing, wheezing, and shortness of breath, those symptoms may get better when you quit. You may also get sick less often. If you are pregnant, quitting smoking can help to lower your chances of having a baby of low birth weight. What can I do to help me quit smoking? Talk with your doctor about what can help you quit smoking. Some things you can do (strategies) include:  Quitting smoking totally, instead of slowly cutting back how much you smoke over a period of time.  Going to in-person counseling. You are more likely to quit if you go to many counseling sessions.  Using resources and support systems, such as: ? Online chats with a counselor. ? Phone quitlines. ? Printed self-help materials. ? Support groups or group counseling. ? Text messaging programs. ? Mobile phone apps or applications.  Taking medicines. Some of these medicines may have nicotine in them. If you are pregnant or breastfeeding, do not take any medicines to quit smoking unless your doctor says it is okay. Talk with your doctor about counseling or other things that can help you.  Talk with your doctor about using more than one strategy at the same time, such as taking medicines while you are also going to in-person counseling. This can help make  quitting easier. What things can I do to make it easier to quit? Quitting smoking might feel very hard at first, but there is a lot that you can do to make it easier. Take these steps:  Talk to your family and friends. Ask them to support and encourage you.  Call phone quitlines, reach out to support groups, or work with a counselor.  Ask people who smoke to not smoke around you.  Avoid places that make you want (trigger) to smoke, such as: ? Bars. ? Parties. ? Smoke-break areas at work.  Spend time with people who do not smoke.  Lower the stress in your life. Stress can make you want to smoke. Try these things to help your stress: ? Getting regular exercise. ? Deep-breathing exercises. ? Yoga. ? Meditating. ? Doing a body scan. To do this, close your eyes, focus on one area of your body at a time from head to toe, and notice which parts of your body are tense. Try to relax the muscles in those areas.  Download or buy apps on your mobile phone or tablet that can help you stick to your quit plan. There are many free apps, such as QuitGuide from the CDC (Centers for Disease Control and Prevention). You can find more support from smokefree.gov and other websites.  This information is not intended to replace advice given to you by your health care provider. Make sure you discuss any questions you have with your health care provider. Document Released: 05/14/2009 Document   Revised: 03/15/2016 Document Reviewed: 12/02/2014 Elsevier Interactive Patient Education  2018 [employer]vier Inc.  

## 2017-02-06 ENCOUNTER — Telehealth: Payer: Self-pay | Admitting: Orthopaedic Surgery

## 2017-02-07 MED ORDER — HYDROCODONE-ACETAMINOPHEN 5-325 MG PO TABS: 1.0000 | ORAL_TABLET | Freq: Four times a day (QID) | ORAL | 0 refills | Status: DC | PRN

## 2017-03-22 ENCOUNTER — Telehealth: Payer: Self-pay | Admitting: Orthopaedic Surgery

## 2017-03-22 NOTE — Telephone Encounter (Signed)
Called back to patient, relayed. 

## 2017-03-22 NOTE — Telephone Encounter (Signed)
Patient called to relay he has re-located to [facility], [place], and is inquiring about whether Dr Luna Glasgow can recommend an orthopaedic specialist and possibly a primary care doctor as well?  His ph# is [phone]

## 2017-03-22 NOTE — Telephone Encounter (Signed)
No, I do not know anyone in that area.

## 2017-04-12 ENCOUNTER — Ambulatory Visit: Payer: Self-pay | Admitting: Orthopaedic Surgery

## 2017-06-07 NOTE — Telephone Encounter (Signed)
This encounter was created in error - please disregard.

## 2017-06-07 NOTE — Addendum Note (Signed)
Addended by: Jacklyn Shell on: 06/07/2017 04:29 PM   Modules accepted: Level of Service, SmartSet

## 2017-06-07 NOTE — Addendum Note (Signed)
Addended by: Jacklyn Shell on: 06/07/2017 04:26 PM   Modules accepted: Level of Service, SmartSet

## 2019-02-04 ENCOUNTER — Encounter (HOSPITAL_COMMUNITY): Payer: Self-pay | Admitting: Emergency Medicine

## 2019-02-04 ENCOUNTER — Emergency Department (HOSPITAL_COMMUNITY)
Admission: EM | Admit: 2019-02-04 | Discharge: 2019-02-04 | Disposition: A | Discharge status: Home / Self Care | Payer: Self-pay | Attending: Emergency Medicine | Admitting: Emergency Medicine

## 2019-02-04 ENCOUNTER — Other Ambulatory Visit: Payer: Self-pay

## 2019-02-04 DIAGNOSIS — F17210 Nicotine dependence, cigarettes, uncomplicated: Secondary | ICD-10-CM | POA: Insufficient documentation

## 2019-02-04 DIAGNOSIS — Z79899 Other long term (current) drug therapy: Secondary | ICD-10-CM | POA: Insufficient documentation

## 2019-02-04 DIAGNOSIS — F329 Major depressive disorder, single episode, unspecified: Secondary | ICD-10-CM | POA: Insufficient documentation

## 2019-02-04 DIAGNOSIS — Z85528 Personal history of other malignant neoplasm of kidney: Secondary | ICD-10-CM | POA: Insufficient documentation

## 2019-02-04 DIAGNOSIS — R5383 Other fatigue: Secondary | ICD-10-CM | POA: Insufficient documentation

## 2019-02-04 DIAGNOSIS — I10 Essential (primary) hypertension: Secondary | ICD-10-CM | POA: Insufficient documentation

## 2019-02-04 DIAGNOSIS — F32A Depression, unspecified: Secondary | ICD-10-CM

## 2019-02-04 DIAGNOSIS — Z96642 Presence of left artificial hip joint: Secondary | ICD-10-CM | POA: Insufficient documentation

## 2019-02-04 LAB — BASIC METABOLIC PANEL
Anion gap: 8 (ref 5–15)
BUN: 15 mg/dL (ref 6–20)
CO2: 26 mmol/L (ref 22–32)
Calcium: 9.3 mg/dL (ref 8.9–10.3)
Chloride: 107 mmol/L (ref 98–111)
Creatinine, Ser: 0.92 mg/dL (ref 0.61–1.24)
GFR calc Af Amer: >60 mL/min (ref >60)
GFR calc non Af Amer: >60 mL/min (ref >60)
Glucose, Bld: 116 mg/dL — ABNORMAL HIGH (ref 70–99)
Potassium: 3.9 mmol/L (ref 3.5–5.1)
Sodium: 141 mmol/L (ref 135–145)

## 2019-02-04 LAB — HEPATIC FUNCTION PANEL
ALT: 20 U/L (ref 0–44)
AST: 28 U/L (ref 15–41)
Albumin: 4.3 g/dL (ref 3.5–5.0)
Alkaline Phosphatase: 52 U/L (ref 38–126)
Bilirubin, Direct: 0.1 mg/dL (ref 0.0–0.2)
Indirect Bilirubin: 0.8 mg/dL (ref 0.3–0.9)
Total Bilirubin: 0.9 mg/dL (ref 0.3–1.2)
Total Protein: 7.1 g/dL (ref 6.5–8.1)

## 2019-02-04 LAB — CBC
HCT: 41.3 % (ref 39.0–52.0)
Hemoglobin: 14.3 g/dL (ref 13.0–17.0)
MCH: 30.9 pg (ref 26.0–34.0)
MCHC: 34.6 g/dL (ref 30.0–36.0)
MCV: 89.2 fL (ref 80.0–100.0)
Platelets: 186 10*3/uL (ref 150–400)
RBC: 4.63 MIL/uL (ref 4.22–5.81)
RDW: 12.5 % (ref 11.5–15.5)
WBC: 10.7 10*3/uL — ABNORMAL HIGH (ref 4.0–10.5)
nRBC: 0 % (ref 0.0–0.2)

## 2019-02-04 LAB — TSH: TSH: 0.611 u[IU]/mL (ref 0.350–4.500)

## 2019-02-04 MED ORDER — SODIUM CHLORIDE 0.9% FLUSH: 3.0000 mL | Freq: Once | INTRAVENOUS | Status: DC

## 2019-02-04 NOTE — ED Notes (Signed)
Pt attempted to provide UA, pt unable to void, water given

## 2019-02-04 NOTE — ED Provider Notes (Signed)
[facility] EMERGENCY DEPARTMENT Provider Note   CSN: [id number] Arrival date & time: 02/04/19  1715    History   Chief Complaint Chief Complaint  Patient presents with  . Weakness    HPI Kevin [name] is a 53 y.o. male.     HPI  Patient states he has had generalized weakness and fatigue for the last month.  States he is not been sleeping well and has had racing thoughts.  Endorses nausea but no vomiting.  Denies chest pain or shortness of breath.  No fever or chills.  No abdominal pain.  Patient states he recently moved from [facility].  Does not have a primary physician here.  Past Medical History:  Diagnosis Date  . Anxiety   . Arthritis   . Cancer ([place])    kidney  . Depression   . Former consumption of alcohol    quit 2013  . Hypertension   . Insomnia   . Left renal mass 2012   renal cell carcinoma, completely excised  . Migraines     Patient Active Problem List   Diagnosis Date Noted  . Osteoarthritis of left hip 04/29/2016  . Status post left hip replacement 04/29/2016    Past Surgical History:  Procedure Laterality Date  . MANDIBLE FRACTURE SURGERY  age 63  . ROBOTIC ASSITED PARTIAL NEPHRECTOMY Left 2012  . TOTAL HIP ARTHROPLASTY Left 04/29/2016   Procedure: LEFT TOTAL HIP ARTHROPLASTY ANTERIOR APPROACH;  Surgeon: Mcarthur Rossetti, MD;  Location: [facility];  Service: Orthopedics;  Laterality: Left;        Home Medications    Prior to Admission medications   Medication Sig Start Date End Date Taking? Authorizing Provider  metoprolol tartrate (LOPRESSOR) 50 MG tablet Take 50 mg by mouth 2 (two) times daily.    Yes [provider]  sertraline (ZOLOFT) 50 MG tablet Take 50 mg by mouth every morning.   Yes [provider]    Family History Family History  Problem Relation Age of Onset  . Cancer Mother     Social History Social History   Tobacco Use  . Smoking status: Current Every Day Smoker    Packs/day: 1.00   Years: 30.00    Pack years: 30.00    Types: Cigarettes, E-cigarettes  . Smokeless tobacco: Never Used  Substance Use Topics  . Alcohol use: No    Comment: used to/quit 2013  . Drug use: No     Allergies   Patient has no known allergies.   Review of Systems Review of Systems  Constitutional: Positive for fatigue. Negative for chills and fever.  Eyes: Negative for visual disturbance.  Respiratory: Negative for cough and shortness of breath.   Cardiovascular: Negative for chest pain.  Gastrointestinal: Positive for nausea. Negative for abdominal pain and vomiting.  Musculoskeletal: Negative for back pain, myalgias and neck pain.  Skin: Negative for rash.  Neurological: Negative for dizziness, weakness, light-headedness, numbness and headaches.  Psychiatric/Behavioral: Positive for sleep disturbance. The patient is nervous/anxious.   All other systems reviewed and are negative.    Physical Exam Updated Vital Signs BP (!) 158/84   Pulse 64   Temp 99 F (37.2 C)   Resp 18   Ht 5\' 6"  (1.676 m)   Wt 72.6 kg   SpO2 100%   BMI 25.82 kg/m   Physical Exam Vitals signs and nursing note reviewed.  Constitutional:      General: He is not in acute distress.  Appearance: Normal appearance. He is well-developed. He is not ill-appearing.  HENT:     Head: Normocephalic and atraumatic.     Nose: Nose normal.     Mouth/Throat:     Mouth: Mucous membranes are moist.  Eyes:     Pupils: Pupils are equal, round, and reactive to light.  Neck:     Musculoskeletal: Normal range of motion and neck supple. No neck rigidity or muscular tenderness.  Cardiovascular:     Rate and Rhythm: Normal rate and regular rhythm.     Heart sounds: No murmur. No friction rub. No gallop.   Pulmonary:     Effort: Pulmonary effort is normal. No respiratory distress.     Breath sounds: Normal breath sounds. No stridor. No wheezing, rhonchi or rales.  Chest:     Chest wall: No tenderness.  Abdominal:      General: Bowel sounds are normal.     Palpations: Abdomen is soft.     Tenderness: There is no abdominal tenderness. There is no right CVA tenderness, left CVA tenderness, guarding or rebound.  Musculoskeletal: Normal range of motion.        General: No swelling, tenderness, deformity or signs of injury.     Right lower leg: No edema.     Left lower leg: No edema.  Lymphadenopathy:     Cervical: No cervical adenopathy.  Skin:    General: Skin is warm and dry.     Capillary Refill: Capillary refill takes less than 2 seconds.     Findings: No erythema or rash.  Neurological:     General: No focal deficit present.     Mental Status: He is alert and oriented to person, place, and time.  Psychiatric:        Behavior: Behavior normal.     Comments: Flat affect      ED Treatments / Results  Labs (all labs ordered are listed, but only abnormal results are displayed) Labs Reviewed  BASIC METABOLIC PANEL - Abnormal; Notable for the following components:      Result Value   Glucose, Bld 116 (*)    All other components within normal limits  CBC - Abnormal; Notable for the following components:   WBC 10.7 (*)    All other components within normal limits  HEPATIC FUNCTION PANEL  TSH  CBG MONITORING, ED    EKG EKG Interpretation  Date/Time:  Monday February 04 2019 18:13:02 EDT Ventricular Rate:  65 PR Interval:  164 QRS Duration: 82 QT Interval:  404 QTC Calculation: 420 R Axis:   72 Text Interpretation:  Normal sinus rhythm Normal ECG Confirmed by Julianne Rice [phone]) on 02/04/2019 9:02:42 PM   Radiology No results found.  Procedures Procedures (including critical care time)  Medications Ordered in ED Medications  sodium chloride flush (NS) 0.9 % injection 3 mL (has no administration in time range)     Initial Impression / Assessment and Plan / ED Course  I have reviewed the triage vital signs and the nursing notes.  Pertinent labs & imaging results that were  available during my care of the patient were reviewed by me and considered in my medical decision making (see chart for details).       No definite cause for the patient's fatigue noted.  Possibly may be related to his underlying depression.  He is advised to establish care with an outpatient psychiatric service and may need to have his medications adjusted.  No SI/HI.  Return precautions  given.   Final Clinical Impressions(s) / ED Diagnoses   Final diagnoses:  Fatigue, unspecified type  Depression, unspecified depression type    ED Discharge Orders    None       Julianne Rice, MD 02/04/19 2230

## 2019-02-04 NOTE — ED Triage Notes (Signed)
Pt c/o generalized weakness since March. Pt states he was seen in March and told his thyroid levels were low. Pt also reports n/d x 1 month. Pt moved back from [facility] x 2 weeks ago.

## 2020-02-26 ENCOUNTER — Emergency Department (HOSPITAL_COMMUNITY): Payer: Medicaid - Out of State

## 2020-02-26 ENCOUNTER — Emergency Department (HOSPITAL_COMMUNITY)
Admission: EM | Admit: 2020-02-26 | Discharge: 2020-02-27 | Disposition: A | Discharge status: Home / Self Care | Payer: Medicaid - Out of State | Attending: Emergency Medicine | Admitting: Emergency Medicine

## 2020-02-26 ENCOUNTER — Other Ambulatory Visit: Payer: Self-pay

## 2020-02-26 ENCOUNTER — Encounter (HOSPITAL_COMMUNITY): Payer: Self-pay | Admitting: *Deleted

## 2020-02-26 DIAGNOSIS — Z96643 Presence of artificial hip joint, bilateral: Secondary | ICD-10-CM | POA: Diagnosis not present

## 2020-02-26 DIAGNOSIS — R519 Headache, unspecified: Secondary | ICD-10-CM | POA: Diagnosis not present

## 2020-02-26 DIAGNOSIS — R531 Weakness: Secondary | ICD-10-CM | POA: Insufficient documentation

## 2020-02-26 DIAGNOSIS — Y929 Unspecified place or not applicable: Secondary | ICD-10-CM | POA: Insufficient documentation

## 2020-02-26 DIAGNOSIS — Z79899 Other long term (current) drug therapy: Secondary | ICD-10-CM | POA: Insufficient documentation

## 2020-02-26 DIAGNOSIS — S0990XA Unspecified injury of head, initial encounter: Secondary | ICD-10-CM | POA: Diagnosis not present

## 2020-02-26 DIAGNOSIS — Y939 Activity, unspecified: Secondary | ICD-10-CM | POA: Diagnosis not present

## 2020-02-26 DIAGNOSIS — W03XXXA Other fall on same level due to collision with another person, initial encounter: Secondary | ICD-10-CM | POA: Insufficient documentation

## 2020-02-26 DIAGNOSIS — F17210 Nicotine dependence, cigarettes, uncomplicated: Secondary | ICD-10-CM | POA: Diagnosis not present

## 2020-02-26 DIAGNOSIS — F1010 Alcohol abuse, uncomplicated: Secondary | ICD-10-CM | POA: Diagnosis not present

## 2020-02-26 DIAGNOSIS — M791 Myalgia, unspecified site: Secondary | ICD-10-CM | POA: Diagnosis not present

## 2020-02-26 DIAGNOSIS — I10 Essential (primary) hypertension: Secondary | ICD-10-CM | POA: Insufficient documentation

## 2020-02-26 DIAGNOSIS — Z85520 Personal history of malignant carcinoid tumor of kidney: Secondary | ICD-10-CM | POA: Diagnosis not present

## 2020-02-26 DIAGNOSIS — Y999 Unspecified external cause status: Secondary | ICD-10-CM | POA: Diagnosis not present
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
## 2020-02-27 MED ORDER — CHLORDIAZEPOXIDE HCL 25 MG PO CAPS
ORAL_CAPSULE | ORAL | 0 refills | Status: DC
Start: 2020-02-27 — End: 2020-05-13

## 2020-02-27 MED ORDER — LORAZEPAM 1 MG PO TABS
1.0000 mg | ORAL_TABLET | Freq: Once | ORAL | Status: AC
  Administered 2020-02-27: 02:00:00 1 mg via ORAL
  Filled 2020-02-27: qty 1

## 2020-02-27 MED ORDER — ONDANSETRON 4 MG PO TBDP
4.0000 mg | ORAL_TABLET | Freq: Once | ORAL | Status: AC
  Administered 2020-02-27: 02:00:00 4 mg via ORAL
  Filled 2020-02-27: qty 1

## 2020-02-27 NOTE — Discharge Instructions (Addendum)
Your CT today is reassuring.  You should follow-up for suture removal in 3 more days.  Follow-up with Dr. Aline Brochure regarding the small chip in your wrist.  You were given a medication to help with your alcohol abuse.  Do not drink alcohol with taking this medication.  You may choose a detox facility from the resources attached. Return to the ED with new or worsening symptoms

## 2020-02-29 ENCOUNTER — Encounter (HOSPITAL_COMMUNITY): Payer: Self-pay | Admitting: Emergency Medicine

## 2020-02-29 ENCOUNTER — Emergency Department (HOSPITAL_COMMUNITY)
Admission: EM | Admit: 2020-02-29 | Discharge: 2020-02-29 | Disposition: A | Discharge status: Home / Self Care | Payer: Medicaid - Out of State | Attending: Emergency Medicine | Admitting: Emergency Medicine

## 2020-02-29 ENCOUNTER — Other Ambulatory Visit: Payer: Self-pay

## 2020-02-29 DIAGNOSIS — L237 Allergic contact dermatitis due to plants, except food: Secondary | ICD-10-CM | POA: Diagnosis present

## 2020-02-29 DIAGNOSIS — F17210 Nicotine dependence, cigarettes, uncomplicated: Secondary | ICD-10-CM | POA: Insufficient documentation

## 2020-02-29 DIAGNOSIS — Z85520 Personal history of malignant carcinoid tumor of kidney: Secondary | ICD-10-CM | POA: Diagnosis not present

## 2020-02-29 DIAGNOSIS — Z96642 Presence of left artificial hip joint: Secondary | ICD-10-CM | POA: Diagnosis not present

## 2020-02-29 DIAGNOSIS — M25532 Pain in left wrist: Secondary | ICD-10-CM | POA: Diagnosis not present

## 2020-02-29 DIAGNOSIS — Z79899 Other long term (current) drug therapy: Secondary | ICD-10-CM | POA: Diagnosis not present

## 2020-02-29 MED ORDER — TRIAMCINOLONE ACETONIDE 0.1 % EX CREA
1.0000 | TOPICAL_CREAM | Freq: Two times a day (BID) | CUTANEOUS | 0 refills | Status: AC
Start: 2020-02-29 — End: ?

## 2020-02-29 MED ORDER — CELECOXIB 200 MG PO CAPS
200.0000 mg | ORAL_CAPSULE | Freq: Two times a day (BID) | ORAL | 0 refills | Status: DC
Start: 2020-02-29 — End: 2020-05-13

## 2020-02-29 MED ORDER — PREDNISONE 20 MG PO TABS: ORAL_TABLET | ORAL | 0 refills | Status: DC

## 2020-02-29 NOTE — Discharge Instructions (Addendum)
  Contact a health care provider if you have: Open sores in the rash area. More redness, swelling, or pain in the affected area. Redness that spreads beyond the rash area. Fluid, blood, or pus coming from the affected area. A fever. A rash over a large area of your body. A rash on your eyes, mouth, or genitals. A rash that does not improve after a few weeks. Get help right away if: Your face swells or your eyes swell shut. You have trouble breathing. You have trouble swallowing.

## 2020-02-29 NOTE — ED Notes (Signed)
Here for eval of L wrist pain as well as treatment for poison oak

## 2020-02-29 NOTE — ED Triage Notes (Signed)
Pt c/o of poison oak on both his arms, face and chest. Also c/o of pain to his left wrist from recent fracture
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
## 2020-05-13 ENCOUNTER — Encounter (HOSPITAL_COMMUNITY): Payer: Self-pay | Admitting: Emergency Medicine

## 2020-05-13 ENCOUNTER — Emergency Department (HOSPITAL_COMMUNITY)
Admission: EM | Admit: 2020-05-13 | Discharge: 2020-05-13 | Disposition: A | Discharge status: Home / Self Care | Payer: Medicaid - Out of State | Attending: Emergency Medicine | Admitting: Emergency Medicine

## 2020-05-13 ENCOUNTER — Emergency Department (HOSPITAL_COMMUNITY): Payer: Medicaid - Out of State

## 2020-05-13 ENCOUNTER — Other Ambulatory Visit: Payer: Self-pay

## 2020-05-13 DIAGNOSIS — M79652 Pain in left thigh: Secondary | ICD-10-CM | POA: Insufficient documentation

## 2020-05-13 DIAGNOSIS — I10 Essential (primary) hypertension: Secondary | ICD-10-CM | POA: Diagnosis not present

## 2020-05-13 DIAGNOSIS — Z96642 Presence of left artificial hip joint: Secondary | ICD-10-CM | POA: Insufficient documentation

## 2020-05-13 DIAGNOSIS — Z85528 Personal history of other malignant neoplasm of kidney: Secondary | ICD-10-CM | POA: Diagnosis not present

## 2020-05-13 DIAGNOSIS — M5432 Sciatica, left side: Secondary | ICD-10-CM

## 2020-05-13 DIAGNOSIS — M25552 Pain in left hip: Secondary | ICD-10-CM | POA: Diagnosis present

## 2020-05-13 DIAGNOSIS — F17210 Nicotine dependence, cigarettes, uncomplicated: Secondary | ICD-10-CM | POA: Insufficient documentation

## 2020-05-13 DIAGNOSIS — M25562 Pain in left knee: Secondary | ICD-10-CM | POA: Diagnosis not present

## 2020-05-13 DIAGNOSIS — R52 Pain, unspecified: Secondary | ICD-10-CM

## 2020-05-13 DIAGNOSIS — Z79899 Other long term (current) drug therapy: Secondary | ICD-10-CM | POA: Insufficient documentation

## 2020-05-13 MED ORDER — CYCLOBENZAPRINE HCL 10 MG PO TABS: 10.0000 mg | ORAL_TABLET | Freq: Three times a day (TID) | ORAL | 0 refills | Status: AC | PRN

## 2020-05-13 NOTE — ED Provider Notes (Signed)
[facility] EMERGENCY DEPARTMENT Provider Note   CSN: [id number] Arrival date & time: 05/13/20  0359   History Chief Complaint  Patient presents with  . Hip Pain    Kevin [name] is a 54 y.o. male.  The history is provided by the patient.  Hip Pain  He has history of hypertension, renal cell carcinoma, status post left hip total arthroplasty and comes in complaining of pain in his left hip and thigh and knee.  He states that pain has been present for about a year but has been getting worse over the last several weeks since she took a new job which requires him to step up and down off of a stool multiple times.  He is concerned that the prosthesis may be loosening.  He also has pain that goes into his back.  He rates pain at 8/10 at its worst, 3/10 at rest.  He has been taking 5 or 6 naproxen tablets a day without relief.  Of note, he had also been told that he had arthritis in the right hip and that the right hip would eventually need to be replaced.  Past Medical History:  Diagnosis Date  . Anxiety   . Arthritis   . Cancer ([place])    kidney  . Depression   . Former consumption of alcohol    quit 2013  . Hypertension   . Insomnia   . Left renal mass 2012   renal cell carcinoma, completely excised  . Migraines     Patient Active Problem List   Diagnosis Date Noted  . Osteoarthritis of left hip 04/29/2016  . Status post left hip replacement 04/29/2016    Past Surgical History:  Procedure Laterality Date  . MANDIBLE FRACTURE SURGERY  age 79  . ROBOTIC ASSITED PARTIAL NEPHRECTOMY Left 2012  . TOTAL HIP ARTHROPLASTY Left 04/29/2016   Procedure: LEFT TOTAL HIP ARTHROPLASTY ANTERIOR APPROACH;  Surgeon: Mcarthur Rossetti, MD;  Location: [facility];  Service: Orthopedics;  Laterality: Left;       Family History  Problem Relation Age of Onset  . Cancer Mother     Social History   Tobacco Use  . Smoking status: Current Every Day Smoker    Packs/day: 1.00    Years:  30.00    Pack years: 30.00    Types: Cigarettes, E-cigarettes  . Smokeless tobacco: Never Used  Substance Use Topics  . Alcohol use: No    Comment: used to/quit 2013  . Drug use: No    Home Medications Prior to Admission medications   Medication Sig Start Date End Date Taking? Authorizing Provider  celecoxib (CELEBREX) 200 MG capsule Take 1 capsule (200 mg total) by mouth 2 (two) times daily. 02/29/20   Harris, Vernie Shanks, PA-C  chlordiazePOXIDE (LIBRIUM) 25 MG capsule 50mg  PO TID x 1D, then 25-50mg  PO BID X 1D, then 25-50mg  PO QD X 1D 02/27/20   Rancour, Annie Main, MD  metoprolol tartrate (LOPRESSOR) 50 MG tablet Take 50 mg by mouth 2 (two) times daily.     [provider]  predniSONE (DELTASONE) 20 MG tablet 3 tabs po daily x 3 days, then 2 tabs x 3 days, then 1.5 tabs x 3 days, then 1 tab x 3 days, then 0.5 tabs x 3 days 02/29/20   Margarita Mail, PA-C  triamcinolone cream (KENALOG) 0.1 % Apply 1 application topically 2 (two) times daily. 02/29/20   Margarita Mail, PA-C    Allergies    Patient has no known  allergies.  Review of Systems   Review of Systems  All other systems reviewed and are negative.   Physical Exam Updated Vital Signs Ht 5\' 5"  (1.651 m)   Wt 74.8 kg   BMI 27.46 kg/m   Physical Exam Vitals and nursing note reviewed.   54 year old male, resting comfortably and in no acute distress. Vital signs are normal. Oxygen saturation is 100%, which is normal. Head is normocephalic and atraumatic. PERRLA, EOMI. Oropharynx is clear. Neck is nontender and supple without adenopathy or JVD. Back is tender in the lumbar area with moderate bilateral paralumbar spasm.  Straight leg raise is positive on the left at 45degrees.  There is no CVA tenderness. Lungs are clear without rales, wheezes, or rhonchi. Chest is nontender. Heart has regular rate and rhythm without murmur. Abdomen is soft, flat, nontender without masses or hepatosplenomegaly and peristalsis is  normoactive. Extremities: There is full range of motion present in the left hip without any obvious loosening of the prosthesis and without pain being elicited.  Full range of motion is also present in the right hip.  Left knee has no swelling and no instability, full passive range of motion present.. Skin is warm and dry without rash. Neurologic: Mental status is normal, cranial nerves are intact, there are no motor or sensory deficits.  ED Results / Procedures / Treatments    Radiology DG Lumbar Spine Complete  Result Date: 05/13/2020 CLINICAL DATA:  Low back pain. EXAM: LUMBAR SPINE - COMPLETE 4+ VIEW COMPARISON:  Lumbar spine radiographs 11/11/2015 FINDINGS: Five non rib-bearing lumbar type vertebral bodies are present. Progressive endplate degenerative changes are present, particularly at L1-2, L2-3, and L4-5. Vertebral body heights are maintained. Surgical clips are present over the left renal fossa. Vascular calcifications are evident. Left hip arthroplasty noted. IMPRESSION: 1. Progressive endplate degenerative changes in the lumbar spine. 2. No acute abnormality. 3. Atherosclerosis. Electronically Signed   By: San Morelle M.D.   On: 05/13/2020 05:53   DG Knee Complete 4 Views Left  Result Date: 05/13/2020 CLINICAL DATA:  Progressive left knee pain. Personal history of arthritis. EXAM: LEFT KNEE - COMPLETE 4+ VIEW COMPARISON:  Left knee radiographs 02/05/2014 FINDINGS: The knee is located. Mild degenerative changes are noted in the patellofemoral compartment. No significant effusion is present. No acute or healing fractures are present. Vascular calcifications are noted. IMPRESSION: 1. Mild degenerative changes in the patellofemoral compartment. 2. No acute or healing fractures. Electronically Signed   By: San Morelle M.D.   On: 05/13/2020 05:52   DG HIPS BILAT WITH PELVIS MIN 5 VIEWS  Result Date: 05/13/2020 CLINICAL DATA:  Progressive right hip pain. EXAM: DG HIP  (WITH OR WITHOUT PELVIS) 5+V BILAT COMPARISON:  Left hip radiographs 08/12/2016 FINDINGS: Left total hip arthroplasty is again noted. The right hip is located. Joint space is preserved. No acute or focal abnormality is present. Visualized pelvis is within normal limits. IMPRESSION: Negative right hip. Electronically Signed   By: San Morelle M.D.   On: 05/13/2020 05:54    Procedures Procedures   Medications Ordered in ED Medications - No data to display  ED Course  I have reviewed the triage vital signs and the nursing notes.  Pertinent imaging results that were available during my care of the patient were reviewed by me and considered in my medical decision making (see chart for details).  MDM Rules/Calculators/A&P Pain in the right hip and thigh in person status post left total hip arthroplasty.  Based  on exam, I doubt that there is any problem with the prosthesis and I suspect that his pain is mainly sciatica.  However, will check x-rays of both hips, left knee and also lumbar spine.  Old records are reviewed confirming left total hip arthroplasty in September 2017.  X-rays show no obvious problem with the left hip prosthesis, no obvious arthritic changes in the right hip, minimal arthritic changes in the left knee, progressive degenerative changes in the lumbar spine.  This is consistent with my original impression.  Patient is advised to limit his naproxen intake to no more than 4 tablets a day, advised to use over-the-counter acetaminophen as needed.  We will add cyclobenzaprine.  He is referred back to his orthopedic physicians for further outpatient evaluation and treatment.  Final Clinical Impression(s) / ED Diagnoses Final diagnoses:  Left sided sciatica    Rx / DC Orders ED Discharge Orders         Ordered    cyclobenzaprine (FLEXERIL) 10 MG tablet  3 times daily PRN        05/13/20 4621           Delora Fuel, MD 94/71/25 [phone]

## 2020-05-13 NOTE — ED Triage Notes (Signed)
Pt c/o left hip that has been getting worse over the last year. Pt states he had a hip replacement x 6 years ago, and his ortho doctor says he will need another. States he came in tonight because he wants to know what is wrong his his hip.

## 2020-05-13 NOTE — Discharge Instructions (Signed)
There is no sign of any problem with your left hip prosthesis.  It seems most likely that your left leg pain is from problems in your back.  Please do not take more than 4 naproxen tablets in any 1 day.  The appropriate way to take naproxen is 2 tablets at a time, twice a day.  You may take acetaminophen for additional pain relief.  Please follow-up with one of the orthopedic doctors.  You may need to have additional evaluation to clarify what is causing your pain and develop an appropriate treatment plan.

## 2020-05-16 ENCOUNTER — Other Ambulatory Visit: Payer: Self-pay

## 2020-05-16 ENCOUNTER — Encounter (HOSPITAL_COMMUNITY): Payer: Self-pay

## 2020-05-16 ENCOUNTER — Emergency Department (HOSPITAL_COMMUNITY)
Admission: EM | Admit: 2020-05-16 | Discharge: 2020-05-16 | Disposition: A | Discharge status: Home / Self Care | Payer: Medicaid - Out of State | Attending: Emergency Medicine | Admitting: Emergency Medicine

## 2020-05-16 DIAGNOSIS — Z85528 Personal history of other malignant neoplasm of kidney: Secondary | ICD-10-CM | POA: Insufficient documentation

## 2020-05-16 DIAGNOSIS — K0889 Other specified disorders of teeth and supporting structures: Secondary | ICD-10-CM

## 2020-05-16 DIAGNOSIS — F17210 Nicotine dependence, cigarettes, uncomplicated: Secondary | ICD-10-CM | POA: Diagnosis not present

## 2020-05-16 DIAGNOSIS — Z79899 Other long term (current) drug therapy: Secondary | ICD-10-CM | POA: Diagnosis not present

## 2020-05-16 DIAGNOSIS — Z96642 Presence of left artificial hip joint: Secondary | ICD-10-CM | POA: Diagnosis not present

## 2020-05-16 DIAGNOSIS — K029 Dental caries, unspecified: Secondary | ICD-10-CM

## 2020-05-16 DIAGNOSIS — I10 Essential (primary) hypertension: Secondary | ICD-10-CM | POA: Insufficient documentation

## 2020-05-16 MED ORDER — IBUPROFEN 400 MG PO TABS
400.0000 mg | ORAL_TABLET | Freq: Once | ORAL | Status: DC
  Filled 2020-05-16: qty 1

## 2020-05-16 MED ORDER — IBUPROFEN 600 MG PO TABS: 600.0000 mg | ORAL_TABLET | Freq: Three times a day (TID) | ORAL | 0 refills | Status: AC | PRN

## 2020-05-16 MED ORDER — PENICILLIN V POTASSIUM 500 MG PO TABS
500.0000 mg | ORAL_TABLET | Freq: Three times a day (TID) | ORAL | 0 refills | Status: AC
Start: 2020-05-16 — End: ?

## 2020-05-16 MED ORDER — PENICILLIN V POTASSIUM 250 MG PO TABS
500.0000 mg | ORAL_TABLET | Freq: Once | ORAL | Status: AC
  Administered 2020-05-16: 500 mg via ORAL
  Filled 2020-05-16: qty 2

## 2020-05-16 NOTE — Discharge Instructions (Addendum)
Take the medications as prescribed.  Follow-up with your dentist for further treatment

## 2020-05-16 NOTE — ED Notes (Signed)
Verbalized understanding of discharge instructions.

## 2020-05-16 NOTE — ED Provider Notes (Signed)
[facility] EMERGENCY DEPARTMENT Provider Note   CSN: [id number] Arrival date & time: 05/16/20  6384     History Chief Complaint  Patient presents with  . Dental Pain    Kevin [name] is a 54 y.o. male.   Dental Pain Location:  Lower Lower teeth location:  19/LL 1st molar Quality:  Dull and constant Severity:  Moderate Onset quality: Pt has had trouble with this tooth before.  This morning he woke up with pain, feels like a tooth infection. Timing:  Constant Chronicity:  Recurrent Context: dental caries   Relieved by:  Nothing Worsened by:  Pressure and touching Ineffective treatments:  None tried Associated symptoms: no difficulty swallowing, no facial swelling, no fever, no neck swelling and no oral bleeding        Past Medical History:  Diagnosis Date  . Anxiety   . Arthritis   . Cancer ([place])    kidney  . Depression   . Former consumption of alcohol    quit 2013  . Hypertension   . Insomnia   . Left renal mass 2012   renal cell carcinoma, completely excised  . Migraines     Patient Active Problem List   Diagnosis Date Noted  . Osteoarthritis of left hip 04/29/2016  . Status post left hip replacement 04/29/2016    Past Surgical History:  Procedure Laterality Date  . MANDIBLE FRACTURE SURGERY  age 36  . ROBOTIC ASSITED PARTIAL NEPHRECTOMY Left 2012  . TOTAL HIP ARTHROPLASTY Left 04/29/2016   Procedure: LEFT TOTAL HIP ARTHROPLASTY ANTERIOR APPROACH;  Surgeon: Mcarthur Rossetti, MD;  Location: [facility];  Service: Orthopedics;  Laterality: Left;       Family History  Problem Relation Age of Onset  . Cancer Mother     Social History   Tobacco Use  . Smoking status: Current Every Day Smoker    Packs/day: 1.00    Years: 30.00    Pack years: 30.00    Types: Cigarettes, E-cigarettes  . Smokeless tobacco: Never Used  Substance Use Topics  . Alcohol use: No    Comment: used to/quit 2013  . Drug use: No    Home Medications Prior to  Admission medications   Medication Sig Start Date End Date Taking? Authorizing Provider  cyclobenzaprine (FLEXERIL) 10 MG tablet Take 1 tablet (10 mg total) by mouth 3 (three) times daily as needed for muscle spasms. 66/59/93   Delora Fuel, MD  ibuprofen (ADVIL) 600 MG tablet Take 1 tablet (600 mg total) by mouth every 8 (eight) hours as needed for up to 10 days. 05/16/20 05/26/20  Dorie Rank, MD  metoprolol tartrate (LOPRESSOR) 50 MG tablet Take 50 mg by mouth 2 (two) times daily.     [provider]  penicillin v potassium (VEETID) 500 MG tablet Take 1 tablet (500 mg total) by mouth 3 (three) times daily. 05/16/20   Dorie Rank, MD  triamcinolone cream (KENALOG) 0.1 % Apply 1 application topically 2 (two) times daily. 02/29/20   Margarita Mail, PA-C    Allergies    Patient has no known allergies.  Review of Systems   Review of Systems  Constitutional: Negative for fever.  HENT: Negative for facial swelling.   All other systems reviewed and are negative.   Physical Exam Updated Vital Signs BP 130/75 (BP Location: Right Arm)   Pulse 92   Temp 97.6 F (36.4 C) (Oral)   Resp 18   Ht 1.651 m (5\' 5" )   Wt  72.6 kg   SpO2 99%   BMI 26.63 kg/m   Physical Exam Vitals and nursing note reviewed.  Constitutional:      General: He is not in acute distress.    Appearance: He is well-developed.  HENT:     Head: Normocephalic and atraumatic.     Right Ear: External ear normal.     Left Ear: External ear normal.     Mouth/Throat:     Mouth: Mucous membranes are moist. No angioedema.     Dentition: Dental tenderness and dental caries present. No gingival swelling.     Tongue: No lesions.     Pharynx: Oropharynx is clear. Uvula midline. No uvula swelling.   Eyes:     General: No scleral icterus.       Right eye: No discharge.        Left eye: No discharge.     Conjunctiva/sclera: Conjunctivae normal.  Neck:     Trachea: No tracheal deviation.  Cardiovascular:     Rate  and Rhythm: Normal rate and regular rhythm.  Pulmonary:     Effort: Pulmonary effort is normal. No respiratory distress.     Breath sounds: Normal breath sounds. No stridor.  Abdominal:     General: There is no distension.  Musculoskeletal:        General: No swelling or deformity.     Cervical back: Neck supple.  Skin:    General: Skin is warm and dry.     Findings: No rash.  Neurological:     Mental Status: He is alert.     Cranial Nerves: Cranial nerve deficit: no gross deficits.     ED Results / Procedures / Treatments    Procedures Procedures (including critical care time)  Medications Ordered in ED Medications  ibuprofen (ADVIL) tablet 400 mg (has no administration in time range)  penicillin v potassium (VEETID) tablet 500 mg (has no administration in time range)    ED Course  I have reviewed the triage vital signs and the nursing notes.  Pertinent labs & imaging results that were available during my care of the patient were reviewed by me and considered in my medical decision making (see chart for details).    MDM Rules/Calculators/A&P                          Consistent with dental caries.  May be developing an infection.  No sign of facial swelling to sugest large abscess.  Will start on abx, nsaids, outpt follow up with dentist.   Pt states he has a dentist. Final Clinical Impression(s) / ED Diagnoses Final diagnoses:  Dental caries  Toothache    Rx / DC Orders ED Discharge Orders         Ordered    penicillin v potassium (VEETID) 500 MG tablet  3 times daily        05/16/20 0934    ibuprofen (ADVIL) 600 MG tablet  Every 8 hours PRN        05/16/20 0934           Dorie Rank, MD 05/16/20 [phone]

## 2020-05-16 NOTE — ED Triage Notes (Signed)
Pt presents to ED with complaints left lower tooth pain. Pt states he woke up this morning with pain.

## 2020-06-10 ENCOUNTER — Other Ambulatory Visit: Payer: Self-pay

## 2020-06-10 ENCOUNTER — Emergency Department (HOSPITAL_COMMUNITY): Payer: Medicaid - Out of State

## 2020-06-10 ENCOUNTER — Inpatient Hospital Stay (HOSPITAL_COMMUNITY): Payer: Medicaid - Out of State | Admitting: Certified Registered"

## 2020-06-10 ENCOUNTER — Encounter (HOSPITAL_COMMUNITY): Admission: EM | Disposition: A | Discharge status: Home / Self Care | Payer: Self-pay | Source: Home / Self Care

## 2020-06-10 ENCOUNTER — Inpatient Hospital Stay (HOSPITAL_COMMUNITY)
Admission: EM | Admit: 2020-06-10 | Discharge: 2020-06-15 | DRG: 958 | Disposition: A | Discharge status: Home / Self Care | Payer: Medicaid - Out of State | Attending: Surgery | Admitting: Surgery

## 2020-06-10 ENCOUNTER — Encounter (HOSPITAL_COMMUNITY): Payer: Self-pay

## 2020-06-10 DIAGNOSIS — S82402A Unspecified fracture of shaft of left fibula, initial encounter for closed fracture: Secondary | ICD-10-CM | POA: Diagnosis present

## 2020-06-10 DIAGNOSIS — S2242XA Multiple fractures of ribs, left side, initial encounter for closed fracture: Secondary | ICD-10-CM

## 2020-06-10 DIAGNOSIS — S02652B Fracture of angle of left mandible, initial encounter for open fracture: Secondary | ICD-10-CM

## 2020-06-10 DIAGNOSIS — S2232XA Fracture of one rib, left side, initial encounter for closed fracture: Secondary | ICD-10-CM | POA: Diagnosis present

## 2020-06-10 DIAGNOSIS — S36039A Unspecified laceration of spleen, initial encounter: Secondary | ICD-10-CM | POA: Diagnosis present

## 2020-06-10 DIAGNOSIS — S02609B Fracture of mandible, unspecified, initial encounter for open fracture: Secondary | ICD-10-CM | POA: Diagnosis present

## 2020-06-10 DIAGNOSIS — T1490XA Injury, unspecified, initial encounter: Principal | ICD-10-CM | POA: Diagnosis present

## 2020-06-10 DIAGNOSIS — S0219XA Other fracture of base of skull, initial encounter for closed fracture: Secondary | ICD-10-CM

## 2020-06-10 DIAGNOSIS — T07XXXA Unspecified multiple injuries, initial encounter: Secondary | ICD-10-CM

## 2020-06-10 DIAGNOSIS — S27321A Contusion of lung, unilateral, initial encounter: Secondary | ICD-10-CM

## 2020-06-10 DIAGNOSIS — S060X1A Concussion with loss of consciousness of 30 minutes or less, initial encounter: Secondary | ICD-10-CM

## 2020-06-10 DIAGNOSIS — S0081XA Abrasion of other part of head, initial encounter: Secondary | ICD-10-CM | POA: Diagnosis present

## 2020-06-10 DIAGNOSIS — D6959 Other secondary thrombocytopenia: Secondary | ICD-10-CM | POA: Diagnosis present

## 2020-06-10 DIAGNOSIS — I10 Essential (primary) hypertension: Secondary | ICD-10-CM | POA: Diagnosis present

## 2020-06-10 DIAGNOSIS — S0031XA Abrasion of nose, initial encounter: Secondary | ICD-10-CM | POA: Diagnosis present

## 2020-06-10 DIAGNOSIS — F32A Depression, unspecified: Secondary | ICD-10-CM | POA: Diagnosis present

## 2020-06-10 DIAGNOSIS — Z20822 Contact with and (suspected) exposure to covid-19: Secondary | ICD-10-CM | POA: Diagnosis present

## 2020-06-10 DIAGNOSIS — Z96642 Presence of left artificial hip joint: Secondary | ICD-10-CM | POA: Diagnosis present

## 2020-06-10 DIAGNOSIS — Z23 Encounter for immunization: Secondary | ICD-10-CM | POA: Diagnosis not present

## 2020-06-10 DIAGNOSIS — Z905 Acquired absence of kidney: Secondary | ICD-10-CM

## 2020-06-10 DIAGNOSIS — Y92411 Interstate highway as the place of occurrence of the external cause: Secondary | ICD-10-CM | POA: Diagnosis not present

## 2020-06-10 DIAGNOSIS — Z85528 Personal history of other malignant neoplasm of kidney: Secondary | ICD-10-CM

## 2020-06-10 DIAGNOSIS — F419 Anxiety disorder, unspecified: Secondary | ICD-10-CM | POA: Diagnosis present

## 2020-06-10 DIAGNOSIS — F17210 Nicotine dependence, cigarettes, uncomplicated: Secondary | ICD-10-CM | POA: Diagnosis present

## 2020-06-10 DIAGNOSIS — D62 Acute posthemorrhagic anemia: Secondary | ICD-10-CM | POA: Diagnosis present

## 2020-06-10 DIAGNOSIS — G8929 Other chronic pain: Secondary | ICD-10-CM | POA: Diagnosis present

## 2020-06-10 DIAGNOSIS — S82455A Nondisplaced comminuted fracture of shaft of left fibula, initial encounter for closed fracture: Secondary | ICD-10-CM | POA: Diagnosis present

## 2020-06-10 DIAGNOSIS — S36030A Superficial (capsular) laceration of spleen, initial encounter: Secondary | ICD-10-CM | POA: Diagnosis present

## 2020-06-10 DIAGNOSIS — R6884 Jaw pain: Secondary | ICD-10-CM | POA: Diagnosis present

## 2020-06-10 HISTORY — PX: ORIF MANDIBULAR FRACTURE: SHX2127

## 2020-06-10 LAB — RESPIRATORY PANEL BY RT PCR (FLU A&B, COVID)
Influenza A by PCR: NEGATIVE
Influenza B by PCR: NEGATIVE
SARS Coronavirus 2 by RT PCR: NEGATIVE

## 2020-06-10 LAB — CBC WITH DIFFERENTIAL/PLATELET
Abs Immature Granulocytes: 0.09 10*3/uL — ABNORMAL HIGH (ref 0.00–0.07)
Basophils Absolute: 0 10*3/uL (ref 0.0–0.1)
Basophils Relative: 0 %
Eosinophils Absolute: 0.2 10*3/uL (ref 0.0–0.5)
Eosinophils Relative: 2 %
HCT: 38.7 % — ABNORMAL LOW (ref 39.0–52.0)
Hemoglobin: 13.2 g/dL (ref 13.0–17.0)
Immature Granulocytes: 1 %
Lymphocytes Relative: 23 %
Lymphs Abs: 2.4 10*3/uL (ref 0.7–4.0)
MCH: 30.8 pg (ref 26.0–34.0)
MCHC: 34.1 g/dL (ref 30.0–36.0)
MCV: 90.4 fL (ref 80.0–100.0)
Monocytes Absolute: 0.6 10*3/uL (ref 0.1–1.0)
Monocytes Relative: 6 %
Neutro Abs: 7.4 10*3/uL (ref 1.7–7.7)
Neutrophils Relative %: 68 %
Platelets: 136 10*3/uL — ABNORMAL LOW (ref 150–400)
RBC: 4.28 MIL/uL (ref 4.22–5.81)
RDW: 12.2 % (ref 11.5–15.5)
WBC: 10.8 10*3/uL — ABNORMAL HIGH (ref 4.0–10.5)
nRBC: 0 % (ref 0.0–0.2)

## 2020-06-10 LAB — COMPREHENSIVE METABOLIC PANEL
ALT: 59 U/L — ABNORMAL HIGH (ref 0–44)
AST: 137 U/L — ABNORMAL HIGH (ref 15–41)
Albumin: 4 g/dL (ref 3.5–5.0)
Alkaline Phosphatase: 55 U/L (ref 38–126)
Anion gap: 11 (ref 5–15)
BUN: 28 mg/dL — ABNORMAL HIGH (ref 6–20)
CO2: 26 mmol/L (ref 22–32)
Calcium: 9.4 mg/dL (ref 8.9–10.3)
Chloride: 105 mmol/L (ref 98–111)
Creatinine, Ser: 1.3 mg/dL — ABNORMAL HIGH (ref 0.61–1.24)
GFR, Estimated: >60 mL/min (ref >60)
Glucose, Bld: 76 mg/dL (ref 70–99)
Potassium: 4.3 mmol/L (ref 3.5–5.1)
Sodium: 142 mmol/L (ref 135–145)
Total Bilirubin: 0.6 mg/dL (ref 0.3–1.2)
Total Protein: 6.1 g/dL — ABNORMAL LOW (ref 6.5–8.1)

## 2020-06-10 LAB — URINALYSIS, ROUTINE W REFLEX MICROSCOPIC
Bilirubin Urine: NEGATIVE
Glucose, UA: NEGATIVE mg/dL
Ketones, ur: NEGATIVE mg/dL
Leukocytes,Ua: NEGATIVE
Nitrite: NEGATIVE
Protein, ur: 30 mg/dL — AB
Specific Gravity, Urine: 1.035 — ABNORMAL HIGH (ref 1.005–1.030)
pH: 5 (ref 5.0–8.0)

## 2020-06-10 LAB — I-STAT CHEM 8, ED
BUN: 29 mg/dL — ABNORMAL HIGH (ref 6–20)
Calcium, Ion: 1.14 mmol/L — ABNORMAL LOW (ref 1.15–1.40)
Chloride: 108 mmol/L (ref 98–111)
Creatinine, Ser: 1.2 mg/dL (ref 0.61–1.24)
Glucose, Bld: 99 mg/dL (ref 70–99)
HCT: 36 % — ABNORMAL LOW (ref 39.0–52.0)
Hemoglobin: 12.2 g/dL — ABNORMAL LOW (ref 13.0–17.0)
Potassium: 4.1 mmol/L (ref 3.5–5.1)
Sodium: 142 mmol/L (ref 135–145)
TCO2: 22 mmol/L (ref 22–32)

## 2020-06-10 LAB — PROTIME-INR
INR: 1 (ref 0.8–1.2)
Prothrombin Time: 13 seconds (ref 11.4–15.2)

## 2020-06-10 LAB — SAMPLE TO BLOOD BANK

## 2020-06-10 LAB — ETHANOL: Alcohol, Ethyl (B): 13 mg/dL — ABNORMAL HIGH (ref <10)

## 2020-06-10 LAB — LACTIC ACID, PLASMA: Lactic Acid, Venous: 2.1 mmol/L (ref 0.5–1.9)

## 2020-06-10 SURGERY — OPEN REDUCTION INTERNAL FIXATION (ORIF) MANDIBULAR FRACTURE
Anesthesia: General | Site: Mouth

## 2020-06-10 MED ORDER — LIDOCAINE 2% (20 MG/ML) 5 ML SYRINGE
INTRAMUSCULAR | Status: AC
  Filled 2020-06-10: qty 10

## 2020-06-10 MED ORDER — LACTATED RINGERS IV SOLN: INTRAVENOUS | Status: DC | PRN

## 2020-06-10 MED ORDER — PHENYLEPHRINE 40 MCG/ML (10ML) SYRINGE FOR IV PUSH (FOR BLOOD PRESSURE SUPPORT)
PREFILLED_SYRINGE | INTRAVENOUS | Status: AC
  Filled 2020-06-10: qty 30

## 2020-06-10 MED ORDER — ROCURONIUM BROMIDE 10 MG/ML (PF) SYRINGE
PREFILLED_SYRINGE | INTRAVENOUS | Status: AC
  Filled 2020-06-10: qty 20

## 2020-06-10 MED ORDER — HYDROMORPHONE HCL 1 MG/ML IJ SOLN: 0.5000 mg | INTRAMUSCULAR | Status: DC | PRN

## 2020-06-10 MED ORDER — FENTANYL CITRATE (PF) 250 MCG/5ML IJ SOLN
INTRAMUSCULAR | Status: DC | PRN
Start: 2020-06-10 — End: 2020-06-10
  Administered 2020-06-10 (×2): 50 ug via INTRAVENOUS

## 2020-06-10 MED ORDER — ONDANSETRON 4 MG PO TBDP: 4.0000 mg | ORAL_TABLET | Freq: Four times a day (QID) | ORAL | Status: DC | PRN

## 2020-06-10 MED ORDER — PHENYLEPHRINE HCL (PRESSORS) 10 MG/ML IV SOLN
INTRAVENOUS | Status: DC | PRN
  Administered 2020-06-10 (×2): 40 ug via INTRAVENOUS
  Administered 2020-06-10: 80 ug via INTRAVENOUS

## 2020-06-10 MED ORDER — LACTATED RINGERS IV SOLN: INTRAVENOUS | Status: DC

## 2020-06-10 MED ORDER — BACITRACIN ZINC 500 UNIT/GM EX OINT
TOPICAL_OINTMENT | CUTANEOUS | Status: AC
  Filled 2020-06-10: qty 28.35

## 2020-06-10 MED ORDER — FENTANYL CITRATE (PF) 250 MCG/5ML IJ SOLN
INTRAMUSCULAR | Status: AC
  Filled 2020-06-10: qty 5

## 2020-06-10 MED ORDER — ONDANSETRON HCL 4 MG/2ML IJ SOLN
INTRAMUSCULAR | Status: DC | PRN
  Administered 2020-06-10: 4 mg via INTRAVENOUS

## 2020-06-10 MED ORDER — SODIUM CHLORIDE 0.9 % IV BOLUS
1000.0000 mL | Freq: Once | INTRAVENOUS | Status: AC
  Administered 2020-06-10: 1000 mL via INTRAVENOUS

## 2020-06-10 MED ORDER — ACETAMINOPHEN 500 MG PO TABS: 1000.0000 mg | ORAL_TABLET | Freq: Four times a day (QID) | ORAL | Status: DC

## 2020-06-10 MED ORDER — MIDAZOLAM HCL 2 MG/2ML IJ SOLN
INTRAMUSCULAR | Status: AC
  Filled 2020-06-10: qty 2

## 2020-06-10 MED ORDER — DEXTROSE 5 % IV SOLN
INTRAVENOUS | Status: DC | PRN
  Administered 2020-06-10: 3 g via INTRAVENOUS

## 2020-06-10 MED ORDER — CLINDAMYCIN PHOSPHATE 600 MG/50ML IV SOLN
600.0000 mg | Freq: Once | INTRAVENOUS | Status: AC
  Administered 2020-06-10: 600 mg via INTRAVENOUS
  Filled 2020-06-10: qty 50

## 2020-06-10 MED ORDER — ACETAMINOPHEN 500 MG PO TABS: 500.0000 mg | ORAL_TABLET | Freq: Four times a day (QID) | ORAL | Status: DC

## 2020-06-10 MED ORDER — MORPHINE SULFATE (PF) 4 MG/ML IV SOLN
4.0000 mg | Freq: Once | INTRAVENOUS | Status: AC
  Administered 2020-06-10: 4 mg via INTRAVENOUS
  Filled 2020-06-10: qty 1

## 2020-06-10 MED ORDER — CEFAZOLIN SODIUM 1 G IJ SOLR
INTRAMUSCULAR | Status: AC
  Filled 2020-06-10: qty 30

## 2020-06-10 MED ORDER — SUCCINYLCHOLINE CHLORIDE 20 MG/ML IJ SOLN
INTRAMUSCULAR | Status: DC | PRN
  Administered 2020-06-10: 120 mg via INTRAVENOUS

## 2020-06-10 MED ORDER — 0.9 % SODIUM CHLORIDE (POUR BTL) OPTIME
TOPICAL | Status: DC | PRN
  Administered 2020-06-10: 1000 mL

## 2020-06-10 MED ORDER — PROPOFOL 10 MG/ML IV BOLUS
INTRAVENOUS | Status: DC | PRN
  Administered 2020-06-10: 200 mg via INTRAVENOUS

## 2020-06-10 MED ORDER — PROPOFOL 10 MG/ML IV BOLUS
INTRAVENOUS | Status: AC
  Filled 2020-06-10: qty 20

## 2020-06-10 MED ORDER — MORPHINE SULFATE (PF) 4 MG/ML IV SOLN
4.0000 mg | Freq: Once | INTRAVENOUS | Status: AC
  Administered 2020-06-10: 4 mg via INTRAVENOUS

## 2020-06-10 MED ORDER — OXYCODONE HCL 5 MG PO TABS
5.0000 mg | ORAL_TABLET | Freq: Once | ORAL | Status: AC | PRN
  Administered 2020-06-11: 5 mg via ORAL

## 2020-06-10 MED ORDER — OXYCODONE HCL 5 MG/5ML PO SOLN: 5.0000 mg | Freq: Once | ORAL | Status: AC | PRN

## 2020-06-10 MED ORDER — LIDOCAINE-EPINEPHRINE 2 %-1:100000 IJ SOLN
INTRAMUSCULAR | Status: DC | PRN
  Administered 2020-06-10: 6 mL via INTRADERMAL

## 2020-06-10 MED ORDER — ALBUMIN HUMAN 5 % IV SOLN: INTRAVENOUS | Status: DC | PRN

## 2020-06-10 MED ORDER — HYDROMORPHONE HCL 1 MG/ML IJ SOLN
1.0000 mg | Freq: Once | INTRAMUSCULAR | Status: AC
  Administered 2020-06-10: 1 mg via INTRAVENOUS
  Filled 2020-06-10: qty 1

## 2020-06-10 MED ORDER — LIDOCAINE-EPINEPHRINE 2 %-1:100000 IJ SOLN
INTRAMUSCULAR | Status: AC
  Filled 2020-06-10: qty 1

## 2020-06-10 MED ORDER — OXYCODONE HCL 5 MG PO TABS
5.0000 mg | ORAL_TABLET | ORAL | Status: DC | PRN
  Administered 2020-06-11 – 2020-06-15 (×18): 10 mg via ORAL
  Filled 2020-06-10 (×18): qty 2

## 2020-06-10 MED ORDER — ONDANSETRON HCL 4 MG/2ML IJ SOLN: 4.0000 mg | Freq: Four times a day (QID) | INTRAMUSCULAR | Status: DC | PRN

## 2020-06-10 MED ORDER — OXYMETAZOLINE HCL 0.05 % NA SOLN
NASAL | Status: DC | PRN
  Administered 2020-06-10: 1

## 2020-06-10 MED ORDER — LIDOCAINE HCL (CARDIAC) PF 100 MG/5ML IV SOSY
PREFILLED_SYRINGE | INTRAVENOUS | Status: DC | PRN
  Administered 2020-06-10: 60 mg via INTRATRACHEAL

## 2020-06-10 MED ORDER — HYDROMORPHONE HCL 1 MG/ML IJ SOLN
0.2500 mg | INTRAMUSCULAR | Status: AC | PRN
  Administered 2020-06-11 (×8): 0.5 mg via INTRAVENOUS

## 2020-06-10 MED ORDER — ARTIFICIAL TEARS OPHTHALMIC OINT
TOPICAL_OINTMENT | OPHTHALMIC | Status: AC
  Filled 2020-06-10: qty 10.5

## 2020-06-10 MED ORDER — IOHEXOL 300 MG/ML  SOLN
100.0000 mL | Freq: Once | INTRAMUSCULAR | Status: AC | PRN
  Administered 2020-06-10: 100 mL via INTRAVENOUS

## 2020-06-10 MED ORDER — OXYMETAZOLINE HCL 0.05 % NA SOLN
NASAL | Status: DC | PRN
  Administered 2020-06-10: 2 via NASAL

## 2020-06-10 MED ORDER — EPHEDRINE SULFATE 50 MG/ML IJ SOLN
INTRAMUSCULAR | Status: DC | PRN
  Administered 2020-06-10 (×5): 5 mg via INTRAVENOUS

## 2020-06-10 MED ORDER — SODIUM CHLORIDE 0.9 % IV SOLN: INTRAVENOUS | Status: DC

## 2020-06-10 MED ORDER — DOCUSATE SODIUM 100 MG PO CAPS
100.0000 mg | ORAL_CAPSULE | Freq: Two times a day (BID) | ORAL | Status: DC
  Administered 2020-06-11 – 2020-06-14 (×7): 100 mg via ORAL
  Filled 2020-06-10 (×8): qty 1

## 2020-06-10 MED ORDER — CEFAZOLIN SODIUM-DEXTROSE 1-4 GM/50ML-% IV SOLN
1.0000 g | Freq: Three times a day (TID) | INTRAVENOUS | Status: AC
  Administered 2020-06-11 – 2020-06-12 (×6): 1 g via INTRAVENOUS
  Filled 2020-06-10 (×7): qty 50

## 2020-06-10 MED ORDER — SUCCINYLCHOLINE CHLORIDE 200 MG/10ML IV SOSY
PREFILLED_SYRINGE | INTRAVENOUS | Status: AC
  Filled 2020-06-10: qty 20

## 2020-06-10 MED ORDER — METOPROLOL SUCCINATE ER 50 MG PO TB24
50.0000 mg | ORAL_TABLET | Freq: Every day | ORAL | Status: DC
  Administered 2020-06-11 – 2020-06-15 (×5): 50 mg via ORAL
  Filled 2020-06-10 (×5): qty 1

## 2020-06-10 MED ORDER — METHOCARBAMOL 500 MG PO TABS: 1000.0000 mg | ORAL_TABLET | Freq: Four times a day (QID) | ORAL | Status: DC

## 2020-06-10 MED ORDER — TETANUS-DIPHTH-ACELL PERTUSSIS 5-2.5-18.5 LF-MCG/0.5 IM SUSY
0.5000 mL | PREFILLED_SYRINGE | Freq: Once | INTRAMUSCULAR | Status: AC
  Administered 2020-06-10: 0.5 mL via INTRAMUSCULAR
  Filled 2020-06-10: qty 0.5

## 2020-06-10 MED ORDER — OXYMETAZOLINE HCL 0.05 % NA SOLN
NASAL | Status: AC
  Filled 2020-06-10: qty 30

## 2020-06-10 MED ORDER — DEXAMETHASONE SODIUM PHOSPHATE 10 MG/ML IJ SOLN
INTRAMUSCULAR | Status: AC
  Filled 2020-06-10: qty 3

## 2020-06-10 MED ORDER — EPHEDRINE 5 MG/ML INJ
INTRAVENOUS | Status: AC
  Filled 2020-06-10: qty 20

## 2020-06-10 MED ORDER — ONDANSETRON HCL 4 MG/2ML IJ SOLN
4.0000 mg | Freq: Once | INTRAMUSCULAR | Status: AC
  Administered 2020-06-10: 4 mg via INTRAVENOUS

## 2020-06-10 MED ORDER — ONDANSETRON HCL 4 MG/2ML IJ SOLN
INTRAMUSCULAR | Status: AC
  Filled 2020-06-10: qty 6

## 2020-06-10 MED ORDER — DEXAMETHASONE SODIUM PHOSPHATE 10 MG/ML IJ SOLN
INTRAMUSCULAR | Status: DC | PRN
  Administered 2020-06-10: 10 mg via INTRAVENOUS

## 2020-06-10 SURGICAL SUPPLY — 62 items
BAR ARCH PREFORM MXLMNDB FX (Miscellaneous) ×2 IMPLANT
BAR FIX PREFORMED OMNIMAX (Miscellaneous) ×6 IMPLANT
BATTERY IQ STERILE (MISCELLANEOUS) ×3 IMPLANT
BIT DRILL TRAUMA ONE 1.6 (DRILL) ×1 IMPLANT
BIT DRILL TRAUMA ONE 1.6X110 (BIT) ×3 IMPLANT
BLADE SURG 15 STRL LF DISP TIS (BLADE) IMPLANT
BLADE SURG 15 STRL SS (BLADE)
BTRY SRG DRVR 1.5 IQ (MISCELLANEOUS) ×1
CANISTER SUCT 3000ML PPV (MISCELLANEOUS) ×3 IMPLANT
CLEANER TIP ELECTROSURG 2X2 (MISCELLANEOUS) ×3 IMPLANT
DRAPE HALF SHEET 40X57 (DRAPES) IMPLANT
DRILL TRAUMA ONE 1.6 (DRILL) ×3
ELECT COATED BLADE 2.86 ST (ELECTRODE) ×3 IMPLANT
ELECT NEEDLE TIP 2.8 STRL (NEEDLE) IMPLANT
ELECT REM PT RETURN 9FT ADLT (ELECTROSURGICAL) ×3
ELECTRODE REM PT RTRN 9FT ADLT (ELECTROSURGICAL) ×1 IMPLANT
GLOVE BIOGEL M 7.0 STRL (GLOVE) ×6 IMPLANT
GLOVE BIOGEL PI IND STRL 6 (GLOVE) ×1 IMPLANT
GLOVE BIOGEL PI IND STRL 6.5 (GLOVE) ×1 IMPLANT
GLOVE BIOGEL PI INDICATOR 6 (GLOVE) ×2
GLOVE BIOGEL PI INDICATOR 6.5 (GLOVE) ×2
GOWN STRL REUS W/ TWL LRG LVL3 (GOWN DISPOSABLE) ×2 IMPLANT
GOWN STRL REUS W/TWL LRG LVL3 (GOWN DISPOSABLE) ×6
KIT BASIN OR (CUSTOM PROCEDURE TRAY) ×3 IMPLANT
KIT TURNOVER KIT B (KITS) ×3 IMPLANT
NEEDLE 18GX1X1/2 (RX/OR ONLY) (NEEDLE) ×3 IMPLANT
NEEDLE HYPO 25GX1X1/2 BEV (NEEDLE) ×3 IMPLANT
NS IRRIG 1000ML POUR BTL (IV SOLUTION) ×3 IMPLANT
PAD ARMBOARD 7.5X6 YLW CONV (MISCELLANEOUS) ×6 IMPLANT
PATTIES SURGICAL .5 X3 (DISPOSABLE) ×3 IMPLANT
PENCIL SMOKE EVACUATOR (MISCELLANEOUS) ×3 IMPLANT
PLATE 6H STR 2.0 (Plate) ×3 IMPLANT
PLATE CURVED 7H 2.6 (Plate) ×3 IMPLANT
PLATE LOCK STRT 4H 1/SCREW (Plate) ×3 IMPLANT
SCISSORS WIRE ANG 4 3/4 DISP (INSTRUMENTS) ×3 IMPLANT
SCREW BONE 2X7 CROSS DRIVE (Screw) ×24 IMPLANT
SCREW LOCKING X-DR 2.0X10 (Screw) ×3 IMPLANT
SCREW LOCKING X-DR 2.0X12 (Screw) ×9 IMPLANT
SCREW LOCKING X-DR 2.0X14 (Screw) ×15 IMPLANT
SCREW NON LOCK X-DR 2.0X12 (Screw) ×3 IMPLANT
SCREW NON LOCK X-DR 2.0X6 (Screw) ×12 IMPLANT
SCREW NON LOCK X-DR 2.0X8 (Screw) ×3 IMPLANT
STAPLER VISISTAT 35W (STAPLE) ×3 IMPLANT
SUT BONE WAX W31G (SUTURE) IMPLANT
SUT CHROMIC 3 0 SH 27 (SUTURE) ×6 IMPLANT
SUT ETHILON 3 0 PS 1 (SUTURE) IMPLANT
SUT ETHILON 4 0 PS 2 18 (SUTURE) ×3 IMPLANT
SUT SILK 3 0 (SUTURE)
SUT SILK 3 0 SH 30 (SUTURE) ×3 IMPLANT
SUT SILK 3-0 18XBRD TIE 12 (SUTURE) IMPLANT
SUT STEEL 0 (SUTURE)
SUT STEEL 0 18XMFL TIE 17 (SUTURE) IMPLANT
SUT STEEL 2 (SUTURE) IMPLANT
SUT VIC AB 3-0 FS2 27 (SUTURE) IMPLANT
SUT VIC AB 4-0 P-3 18X BRD (SUTURE) IMPLANT
SUT VIC AB 4-0 P3 18 (SUTURE)
SUT VIC AB 5-0 PS2 18 (SUTURE) ×3 IMPLANT
SYR BULB IRRIG 60ML STRL (SYRINGE) ×6 IMPLANT
TOWEL GREEN STERILE FF (TOWEL DISPOSABLE) ×3 IMPLANT
TRAY ENT MC OR (CUSTOM PROCEDURE TRAY) ×3 IMPLANT
WATER STERILE IRR 1000ML POUR (IV SOLUTION) ×3 IMPLANT
WIRE 24 GAUGE OMINIMAX MMF (WIRE) ×12 IMPLANT
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
## 2020-06-10 NOTE — ED Notes (Signed)
Pt transported to CT by RN to CT 2

## 2020-06-10 NOTE — Anesthesia Procedure Notes (Signed)
Procedure Name: Intubation Date/Time: 06/10/2020 8:25 PM Performed by: Clovis Cao, CRNA Pre-anesthesia Checklist: Patient identified, Emergency Drugs available, Suction available, Patient being monitored and Timeout performed Patient Re-evaluated:Patient Re-evaluated prior to induction Oxygen Delivery Method: Circle system utilized Preoxygenation: Pre-oxygenation with 100% oxygen Induction Type: IV induction, Rapid sequence and Cricoid Pressure applied Laryngoscope Size: Mac and 3 Grade View: Grade II Nasal Tubes: Nasal prep performed and Right Tube size: 7.5 mm Number of attempts: 1 Placement Confirmation: ETT inserted through vocal cords under direct vision,  positive ETCO2 and breath sounds checked- equal and bilateral Secured at: 27 cm Tube secured with: Tape Dental Injury: Teeth and Oropharynx as per pre-operative assessment

## 2020-06-10 NOTE — Transfer of Care (Signed)
Immediate Anesthesia Transfer of Care Note  Patient: Kevin [name]  Procedure(s) Performed: OPEN REDUCTION INTERNAL FIXATION (ORIF) MANDIBULAR FRACTURE (N/A Mouth)  Patient Location: PACU  Anesthesia Type:General  Level of Consciousness: drowsy  Airway & Oxygen Therapy: Patient Spontanous Breathing  Post-op Assessment: Report given to RN and Post -op Vital signs reviewed and stable  Post vital signs: Reviewed and stable  Last Vitals:  Vitals Value Taken Time  BP 116/101 06/10/20 2311  Temp    Pulse 78 06/10/20 2313  Resp 11 06/10/20 2313  SpO2 100 % 06/10/20 2313  Vitals shown include unvalidated device data.  Last Pain:  Vitals:   06/10/20 1850  TempSrc:   PainSc: Asleep         Complications: No complications documented.
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
## 2020-06-10 NOTE — Op Note (Signed)
Operative Note: OPEN REDUCTION AND INTERNAL FIXATION OF MANDIBLE FRACTURE   Patient: Kevin [name]  Medical record number: [id number]  Date:06/10/2020  Pre-operative Indications: Multiple complex mandible fractures  Postoperative Indications: Same  Surgical Procedure: ORIF of Mandible Fractures  Anesthesia: GET  Surgeon: Delsa Bern, M.D.  Complications: None  EBL: 100 cc   Brief History: The patient is a 54 y.o. male with a history of acute mandible fracture.  Maxillofacial CT scan showed multiple complex mandible fractures involving the parasymphyseal region and the left mandibular angle.  Given the patient's history and findings, I recommended ORIF of mandible fractures under general anesthesia, risks and benefits were discussed in detail with the patient and family. They understand and agree with our plan for surgery which is scheduled at [facility] on an emergency basis.  Surgical Procedure: The patient is brought to the operating room on 06/10/2020 and placed in supine position on the operating table. General nasal endotracheal anesthesia was established without difficulty. When the patient was adequately anesthetized, surgical timeout was performed and correct identification of the patient and the surgical procedure. The patient was positioned and prepped and draped in sterile fashion.  The patient was injected with 6 cc of 1% lidocaine 1 100,000 dilution epinephrine in a submucosal fashion along the anticipated incision lines.  An orogastric tube was passed and the stomach contents were aspirated.  The patient's oral cavity was thoroughly examined.  Findings include lacerations overlying the fracture sites in the midline parasymphyseal region and in the left posterior mandibular ramus.  The patient was placed in good occlusion and the fracture sites were reduced.  Biomet mandibulomaxillary fixation/arch bars were placed.  Fixation stainless steel wire loops were then  placed to maintain occlusion.  Gingivobuccal incisions were created over the fracture sites and soft tissue was elevated to expose the entire mandible from the right lower canine to the left posterior gingivobuccal sulcus.  Using mandibular reconstruction plates the fractures were fixated with plates and screws while the patient was maintained in good occlusion.  A 6-hole mandibular locking plate was placed over the parasymphyseal fracture with good stability.  Intraoral reduction of the angle fracture did not give adequate visualization, a 4 cm horizontally oriented skin incision was then created with a 15 scalpel and Bovie electrocautery was used to elevate soft tissue to the level of the mandibular angle.  This allowed better visualization and reduction.  A 7-hole angled mandibular reconstruction plate was then placed across the fracture and 6 locking and nonlocking screws were then placed to fixate the plate.  Intraoral exam showed good reduction and the fracture appeared stable but to provide additional strength a monocortical tension band 4-hole plate was placed across the fracture line intraorally along the anterior face of the mandibular ramus.   Intraoral and external incisions were then thoroughly irrigated.  Intraoral incisions were then closed using interrupted 4-0 Vicryl suture.  The external incision was closed with interrupted 5-0 Vicryl suture followed by 6-0 Ethilon suture at the skin edge.  The external incision was dressed with bacitracin ointment as were multiple abrasions on the nose and cheek.  Patient's oral cavity was irrigated and suctioned.  An orogastric tube was passed and stomach contents were aspirated. Patient was awakened from anesthetic, extubated and transferred from the operating room to the recovery room in stable condition. There were no complications and blood loss was 100 cc.   Delsa Bern, M.D. [facility]

## 2020-06-10 NOTE — Progress Notes (Signed)
Orthopedic Tech Progress Note Patient Details:  Kevin [name] [birth year] [id number]  Ortho Devices Type of Ortho Device: Post (short leg) splint Ortho Device/Splint Location: LLE Ortho Device/Splint Interventions: Ordered, Application, Adjustment   Post Interventions Patient Tolerated: Well Instructions Provided: Care of device, Poper ambulation with device   [name] 06/10/2020, 7:56 PM
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
## 2020-06-10 NOTE — ED Notes (Signed)
Called CT informed chem 8 resulted requesting to know if room available for rn to transport pt. Informed no room available at this time. Will call back when CT available, estimated time less than 5 minutes.
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
## 2020-06-10 NOTE — ED Notes (Signed)
Ortho tech requested for short leg splint

## 2020-06-10 NOTE — ED Notes (Signed)
Called XR requesting portable x-rays

## 2020-06-11 LAB — HEMOGLOBIN AND HEMATOCRIT, BLOOD
HCT: 34.1 % — ABNORMAL LOW (ref 39.0–52.0)
HCT: 30.8 % — ABNORMAL LOW (ref 39.0–52.0)
HCT: 30.1 % — ABNORMAL LOW (ref 39.0–52.0)
Hemoglobin: 11.8 g/dL — ABNORMAL LOW (ref 13.0–17.0)
Hemoglobin: 10.8 g/dL — ABNORMAL LOW (ref 13.0–17.0)
Hemoglobin: 10.6 g/dL — ABNORMAL LOW (ref 13.0–17.0)

## 2020-06-11 LAB — HIV ANTIBODY (ROUTINE TESTING W REFLEX): HIV Screen 4th Generation wRfx: NONREACTIVE

## 2020-06-11 MED ORDER — HYDROMORPHONE HCL 1 MG/ML IJ SOLN
INTRAMUSCULAR | Status: AC
  Filled 2020-06-11: qty 1

## 2020-06-11 MED ORDER — HYDROMORPHONE HCL 1 MG/ML IJ SOLN
0.5000 mg | INTRAMUSCULAR | Status: DC | PRN
  Administered 2020-06-11 – 2020-06-15 (×9): 0.5 mg via INTRAVENOUS
  Filled 2020-06-11 (×9): qty 1

## 2020-06-11 MED ORDER — BACITRACIN ZINC 500 UNIT/GM EX OINT
TOPICAL_OINTMENT | Freq: Two times a day (BID) | CUTANEOUS | Status: DC
  Administered 2020-06-12: 1 via TOPICAL
  Filled 2020-06-11 (×2): qty 28.4

## 2020-06-11 MED ORDER — METHOCARBAMOL 500 MG PO TABS
ORAL_TABLET | ORAL | Status: AC
  Filled 2020-06-11: qty 2

## 2020-06-11 MED ORDER — DIAZEPAM 5 MG/ML IJ SOLN
0.5000 mg | Freq: Once | INTRAMUSCULAR | Status: AC
  Administered 2020-06-11: 0.5 mg via INTRAVENOUS

## 2020-06-11 MED ORDER — ACETAMINOPHEN 325 MG PO TABS
650.0000 mg | ORAL_TABLET | Freq: Four times a day (QID) | ORAL | Status: DC
  Administered 2020-06-11 (×3): 650 mg via ORAL
  Filled 2020-06-11 (×3): qty 2

## 2020-06-11 MED ORDER — OXYCODONE HCL 5 MG PO TABS
ORAL_TABLET | ORAL | Status: AC
  Filled 2020-06-11: qty 2

## 2020-06-11 MED ORDER — ACETAMINOPHEN 10 MG/ML IV SOLN
INTRAVENOUS | Status: AC
  Administered 2020-06-11: 1000 mg
  Filled 2020-06-11: qty 100

## 2020-06-11 MED ORDER — DIAZEPAM 5 MG/ML IJ SOLN
INTRAMUSCULAR | Status: AC
  Filled 2020-06-11: qty 2

## 2020-06-11 MED ORDER — OXYCODONE HCL 5 MG PO TABS
ORAL_TABLET | ORAL | Status: AC
  Filled 2020-06-11: qty 1

## 2020-06-11 MED ORDER — METHOCARBAMOL 500 MG PO TABS
1000.0000 mg | ORAL_TABLET | Freq: Four times a day (QID) | ORAL | Status: DC
  Administered 2020-06-11 – 2020-06-15 (×18): 1000 mg via ORAL
  Filled 2020-06-11 (×18): qty 2

## 2020-06-11 MED ORDER — HYDROMORPHONE HCL 1 MG/ML IJ SOLN
0.5000 mg | INTRAMUSCULAR | Status: AC | PRN
  Administered 2020-06-11 (×4): 0.5 mg via INTRAVENOUS

## 2020-06-11 MED ORDER — LIDOCAINE 5 % EX PTCH
1.0000 | MEDICATED_PATCH | CUTANEOUS | Status: DC
  Administered 2020-06-11 – 2020-06-15 (×5): 1 via TRANSDERMAL
  Filled 2020-06-11 (×5): qty 1

## 2020-06-11 MED ORDER — DIAZEPAM 2 MG PO TABS
1.0000 mg | ORAL_TABLET | Freq: Three times a day (TID) | ORAL | Status: DC | PRN
  Administered 2020-06-11 – 2020-06-12 (×4): 1 mg via ORAL
  Filled 2020-06-11 (×4): qty 1

## 2020-06-11 NOTE — Anesthesia Postprocedure Evaluation (Signed)
Anesthesia Post Note  Patient: Kevin [name]  Procedure(s) Performed: OPEN REDUCTION INTERNAL FIXATION (ORIF) MANDIBULAR FRACTURE (N/A Mouth)     Patient location during evaluation: PACU Anesthesia Type: General Level of consciousness: awake and alert Pain management: pain level controlled Vital Signs Assessment: post-procedure vital signs reviewed and stable Respiratory status: spontaneous breathing, nonlabored ventilation, respiratory function stable and patient connected to nasal cannula oxygen Cardiovascular status: blood pressure returned to baseline and stable Postop Assessment: no apparent nausea or vomiting Anesthetic complications: no   No complications documented.  Last Vitals:  Vitals:   06/11/20 0530 06/11/20 0600  BP: 127/65 131/70  Pulse: 81 80  Resp: 12 11  Temp:    SpO2: 100% 99%    Last Pain:  Vitals:   06/11/20 0600  TempSrc:   PainSc: [place]

## 2020-06-11 NOTE — Progress Notes (Signed)
Orthopedic Tech Progress Note Patient Details:  Kevin [name] 1966/05/22 [id number]  Ortho Devices Type of Ortho Device: CAM walker Ortho Device/Splint Location: LLE Ortho Device/Splint Interventions: Application, Ordered, Adjustment   Post Interventions Patient Tolerated: Well Instructions Provided: Adjustment of device   Tishia Maestre A Emaleigh Guimond 06/11/2020, 9:56 AM

## 2020-06-11 NOTE — Anesthesia Preprocedure Evaluation (Signed)
Anesthesia Evaluation  Patient identified by MRN, date of birth, ID band Patient awake    Reviewed: Allergy & Precautions, H&P , NPO status , Patient's Chart, lab work & pertinent test results  Airway Mallampati: II   Neck ROM: full    Dental   Pulmonary Current Smoker,    breath sounds clear to auscultation       Cardiovascular hypertension,  Rhythm:regular Rate:Normal     Neuro/Psych    GI/Hepatic   Endo/Other    Renal/GU      Musculoskeletal  (+) Arthritis ,   Abdominal   Peds  Hematology   Anesthesia Other Findings   Reproductive/Obstetrics                             Anesthesia Physical Anesthesia Plan  ASA: II  Anesthesia Plan: General   Post-op Pain Management:    Induction: Intravenous  PONV Risk Score and Plan: 1 and Ondansetron, Dexamethasone, Midazolam and Treatment may vary due to age or medical condition  Airway Management Planned: Nasal ETT  Additional Equipment:   Intra-op Plan:   Post-operative Plan: Extubation in OR  Informed Consent: I have reviewed the patients History and Physical, chart, labs and discussed the procedure including the risks, benefits and alternatives for the proposed anesthesia with the patient or authorized representative who has indicated his/her understanding and acceptance.       Plan Discussed with: CRNA, Anesthesiologist and Surgeon  Anesthesia Plan Comments:         Anesthesia Quick Evaluation

## 2020-06-11 NOTE — Progress Notes (Signed)
Phlebotomy contacted regarding serial hemoglobin and hematocrits.
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
## 2020-06-11 NOTE — Progress Notes (Signed)
Gentle saline mouth rinse performed and bacitracin applied to facial abrasions and left neck incision.  Patient tolerated well.
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
## 2020-06-12 ENCOUNTER — Inpatient Hospital Stay (HOSPITAL_COMMUNITY): Payer: Medicaid - Out of State

## 2020-06-12 ENCOUNTER — Encounter (HOSPITAL_COMMUNITY): Payer: Self-pay | Admitting: Otolaryngology

## 2020-06-12 LAB — CBC
HCT: 28.6 % — ABNORMAL LOW (ref 39.0–52.0)
Hemoglobin: 10 g/dL — ABNORMAL LOW (ref 13.0–17.0)
MCH: 31.3 pg (ref 26.0–34.0)
MCHC: 35 g/dL (ref 30.0–36.0)
MCV: 89.7 fL (ref 80.0–100.0)
Platelets: 74 10*3/uL — ABNORMAL LOW (ref 150–400)
RBC: 3.19 MIL/uL — ABNORMAL LOW (ref 4.22–5.81)
RDW: 11.9 % (ref 11.5–15.5)
WBC: 9.8 10*3/uL (ref 4.0–10.5)
nRBC: 0 % (ref 0.0–0.2)

## 2020-06-12 LAB — BASIC METABOLIC PANEL
Anion gap: 8 (ref 5–15)
BUN: 13 mg/dL (ref 6–20)
CO2: 28 mmol/L (ref 22–32)
Calcium: 8.9 mg/dL (ref 8.9–10.3)
Chloride: 104 mmol/L (ref 98–111)
Creatinine, Ser: 1.09 mg/dL (ref 0.61–1.24)
GFR, Estimated: >60 mL/min (ref >60)
Glucose, Bld: 124 mg/dL — ABNORMAL HIGH (ref 70–99)
Potassium: 4 mmol/L (ref 3.5–5.1)
Sodium: 140 mmol/L (ref 135–145)

## 2020-06-12 LAB — MRSA PCR SCREENING: MRSA by PCR: NEGATIVE

## 2020-06-12 MED ORDER — BUPRENORPHINE HCL 8 MG SL SUBL
8.0000 mg | SUBLINGUAL_TABLET | Freq: Two times a day (BID) | SUBLINGUAL | Status: DC
  Administered 2020-06-12 – 2020-06-15 (×7): 8 mg via SUBLINGUAL
  Filled 2020-06-12 (×7): qty 1

## 2020-06-12 MED ORDER — CHLORHEXIDINE GLUCONATE 0.12 % MT SOLN
15.0000 mL | Freq: Two times a day (BID) | OROMUCOSAL | Status: DC
  Administered 2020-06-12 – 2020-06-13 (×4): 15 mL via OROMUCOSAL
  Filled 2020-06-12 (×4): qty 15

## 2020-06-12 MED ORDER — TRAMADOL HCL 50 MG PO TABS
100.0000 mg | ORAL_TABLET | Freq: Four times a day (QID) | ORAL | Status: DC
  Administered 2020-06-12: 100 mg via ORAL
  Filled 2020-06-12: qty 2

## 2020-06-12 MED ORDER — NICOTINE 14 MG/24HR TD PT24
14.0000 mg | MEDICATED_PATCH | Freq: Every day | TRANSDERMAL | Status: DC
  Administered 2020-06-12 – 2020-06-15 (×4): 14 mg via TRANSDERMAL
  Filled 2020-06-12 (×4): qty 1

## 2020-06-12 MED ORDER — FLUOXETINE HCL 20 MG PO CAPS
20.0000 mg | ORAL_CAPSULE | Freq: Every day | ORAL | Status: DC
  Administered 2020-06-12 – 2020-06-15 (×4): 20 mg via ORAL
  Filled 2020-06-12 (×4): qty 1

## 2020-06-12 MED ORDER — GABAPENTIN 600 MG PO TABS
300.0000 mg | ORAL_TABLET | Freq: Three times a day (TID) | ORAL | Status: DC
  Administered 2020-06-12 (×2): 300 mg via ORAL
  Filled 2020-06-12 (×2): qty 1

## 2020-06-12 MED ORDER — ACETAMINOPHEN 500 MG PO TABS
1000.0000 mg | ORAL_TABLET | Freq: Four times a day (QID) | ORAL | Status: DC
  Administered 2020-06-12 – 2020-06-15 (×14): 1000 mg via ORAL
  Filled 2020-06-12 (×14): qty 2

## 2020-06-12 MED ORDER — AMOXICILLIN-POT CLAVULANATE 500-125 MG PO TABS
1.0000 | ORAL_TABLET | Freq: Two times a day (BID) | ORAL | Status: DC
  Administered 2020-06-12 – 2020-06-15 (×7): 500 mg via ORAL
  Filled 2020-06-12 (×9): qty 1

## 2020-06-12 MED ORDER — IBUPROFEN 200 MG PO TABS
800.0000 mg | ORAL_TABLET | Freq: Three times a day (TID) | ORAL | Status: DC
  Administered 2020-06-12 – 2020-06-13 (×6): 800 mg via ORAL
  Filled 2020-06-12 (×6): qty 4

## 2020-06-12 MED ORDER — CLONIDINE HCL 0.1 MG PO TABS
0.2000 mg | ORAL_TABLET | Freq: Three times a day (TID) | ORAL | Status: DC | PRN
  Administered 2020-06-12 – 2020-06-14 (×3): 0.2 mg via ORAL
  Filled 2020-06-12 (×3): qty 2

## 2020-06-12 NOTE — Discharge Instructions (Signed)
Jaw Fracture Instructions:  1. Limited activity 2. Liquid and soft diet only for 2 wks, then gradually increase diet 3. May bathe and shower 4. Avoid any jaw trauma 5. Elevate Head of Bed 6. Ice compress to jaw 7. Rinse mouth with water twice daily 8. Motrin 600 mg three times daily for pain or swelling for 10 days   Rib Fracture  A rib fracture is a break or crack in one of the bones of the ribs. The ribs are like a cage that goes around your upper chest. A broken or cracked rib is often painful, but most do not cause other problems. Most rib fractures usually heal on their own in 1-3 months. Follow these instructions at home: Managing pain, stiffness, and swelling  If directed, apply ice to the injured area. ? Put ice in a plastic bag. ? Place a towel between your skin and the bag. ? Leave the ice on for 20 minutes, 2-3 times a day.  Take over-the-counter and prescription medicines only as told by your doctor. Activity  Avoid activities that cause pain to the injured area. Protect your injured area.  Slowly increase activity as told by your doctor. General instructions  Do deep breathing as told by your doctor. You may be told to: ? Take deep breaths many times a day. ? Cough many times a day while hugging a pillow. ? Use a device (incentive spirometer) to do deep breathing many times a day.  Drink enough fluid to keep your pee (urine) clear or pale yellow.  Do not wear a rib belt or binder. These do not allow you to breathe deeply.  Keep all follow-up visits as told by your doctor. This is important. Contact a doctor if:  You have a fever. Get help right away if:  You have trouble breathing.  You are short of breath.  You cannot stop coughing.  You cough up thick or bloody spit (sputum).  You feel sick to your stomach (nauseous), throw up (vomit), or have belly (abdominal) pain.  Your pain gets worse and medicine does not help. Summary  A rib fracture is  a break or crack in one of the bones of the ribs.  Apply ice to the injured area and take medicines for pain as told by your doctor.  Take deep breaths and cough many times a day. Hug a pillow every time you cough. This information is not intended to replace advice given to you by your health care provider. Make sure you discuss any questions you have with your health care provider. Document Revised: 06/30/2017 Document Reviewed: 10/18/2016 Elsevier Patient Education  [place], Adult  A walking boot is a medical device that holds your foot or ankle in place after an injury or a medical procedure. This helps with healing and prevents further injury. A walking boot is a removable boot-shaped splint. It has a hard, rigid outer frame that limits movement and supports your foot and leg. The inner lining is a layer of padded material. Walking boots usually have adjustable straps to secure them over the foot and leg. Your health care provider may prescribe a walking boot if you can put weight (bear weight) on your injured foot. How much you can walk while wearing the boot will depend on the type and severity of your injury. Your health care provider will recommend the best boot for you based on your condition. How do I put on my walking boot?  There are different types of walking boots. Each type of boot has specific instructions about how to wear it properly. Follow instructions from your health care provider about wearing your boot. In general:  Ask someone to help you put on the boot, if needed.  Sit to put on your boot. Doing this is more comfortable and it helps to prevent falls.  Open up the boot fully. Place your foot into the boot so your heel rests against the back.  Your toes should be supported by the base of the boot. They should not hang over the front edge.  Adjust the straps so the boot fits securely but is not too tight.  Do not bend the hard frame of the  boot to get a good fit. What are some tips for walking with a walking boot?  Do not try to walk without wearing the boot unless your health care provider has approved.  Use other assistive walking devices, including crutches and canes, as told by your health care provider.  On your uninjured foot, wear a shoe with a heel that is close to the height of the walking boot.  Be careful when walking on surfaces that are uneven or wet. How can I reduce swelling while using a walking boot?   Rest your injured foot or leg as much as possible.  If directed, apply ice to the injured area: ? Put ice in a plastic bag. ? Place a towel between your skin and the bag. ? Leave the ice on for 20 minutes, 2-3 times a day.  Keep your injured foot or leg raised (elevated) above the level of your heart whenever able. Try to do this for at least 2?3 hours each day or as told by your health care provider.  If swelling gets worse, loosen the boot and rest and elevate your foot and leg. How should I take care of my skin and foot while using a walking boot?  Wear a long sock to protect your foot and leg from rubbing inside the boot.  Take off the boot one time each day to check the injured area. Look at your foot, surrounding skin, and leg to make sure there are no sores, rashes, swelling, or wounds. The skin should be a healthy color, not pale or blue.  Try to notice if your walking pattern (gait) in the boot is fairly normal and that you are not walking with a noticeable limp.  Follow instructions from your health care provider about taking care of your incision or wound, if this applies.  Clean and wash the injured area as told by your health care provider.  Gently dry your foot and leg before putting the boot back on. Are there any activity restrictions? Activity restrictions depend on the type and severity of your injury. Follow instructions from your health care provider about limiting  activities.  Bathe and shower as told by your health care provider.  Do not do any activities that could make your injury worse.  Do not drive if your affected foot is one that you usually use for driving. When can I remove my walking boot? Always follow specific directions from your health care provider for removing the walking boot. Generally, it is okay to remove your walking boot:  At the end of the day when you are resting or sleeping.  To clean your foot and leg. How should I keep my walking boot clean?  Do not put any part of the boot  in a washing machine or dryer.  Do not use chemical cleaning products. These could irritate your skin, especially if you have a wound or an incision.  Do not soak the liner of the boot.  Use a washcloth with mild soap and water to clean the frame and the liner of the boot by hand.  Allow the boot to air-dry completely before you put it back on your foot. Contact a health care provider if:  The boot is cracked or damaged.  The boot does not fit properly.  Your foot or leg hurts.  You have a rash, sore, or open sore (ulcer) on your foot or leg.  The skin on your foot or leg is pale.  You have a wound or incision on the foot and it is getting worse.  Your skin becomes painful, red, or irritated.  Your swelling does not get better or it gets worse. Get help right away if:  You cannot feel your foot or leg (have numbness).  You cannot feel a pulse at the top of your foot, where your foot and ankle meet.  Your skin on the foot or leg is cold, blue, or gray. Summary  A walking boot holds your foot or ankle in place after an injury or a medical procedure.  There are different types of walking boots. Follow the specific instructions about how to correctly wear the boot that you have.  Ask someone to help you put on the boot, if needed.  It is important to check your skin and foot every day. Call your health care provider if you  notice a rash or sore on your foot or leg. This information is not intended to replace advice given to you by your health care provider. Make sure you discuss any questions you have with your health care provider. Document Revised: 11/06/2018 Document Reviewed: 08/25/2016 Elsevier Patient Education  [place], Adult  A concussion is a brain injury from a hard, direct hit (trauma) to the head or body. This direct hit causes the brain to shake quickly back and forth inside the skull. This can damage brain cells and cause chemical changes in the brain. A concussion may also be known as a mild traumatic brain injury (TBI). Concussions are usually not life-threatening, but the effects of a concussion can be serious. If you have a concussion, you should be very careful to avoid having a second concussion. What are the causes? This condition is caused by:  A direct hit to your head, such as: ? Running into another player during a game. ? Being hit in a fight. ? Hitting your head on a hard surface.  Sudden movement of your body that causes your brain to move back and forth inside the skull, such as in a car crash. What are the signs or symptoms? The signs of a concussion can be hard to notice. Early on, they may be missed by you, family members, and health care providers. You may look fine on the outside but may act or feel differently. Symptoms are usually temporary and most often improve in 7-10 days. Some symptoms appear right away, but other symptoms may not show up for hours or days. If your symptoms last longer than normal, you may have post-concussion syndrome. Every head injury is different. Physical symptoms  Headaches. This can include a feeling of pressure in the head or migraine-like symptoms.  Tiredness (fatigue).  Dizziness.  Problems with coordination or balance.  Vision or hearing problems.  Sensitivity to light or noise.  Nausea or  vomiting.  Changes in eating or sleeping patterns.  Numbness or tingling.  Seizure. Mental and emotional symptoms  Memory problems.  Trouble concentrating, organizing, or making decisions.  Slowness in thinking, acting or reacting, speaking, or reading.  Irritability or mood changes.  Anxiety or depression. How is this diagnosed? This condition is diagnosed based on:  Your symptoms.  A description of your injury. You may also have tests, including:  Imaging tests, such as a CT scan or MRI.  Neuropsychological tests. These measure your thinking, understanding, learning, and remembering abilities. How is this treated? Treatment for this condition includes:  Stopping sports or activity if you are injured. If you hit your head or show signs of concussion: ? Do not return to sports or activities the same day. ? Get checked by a health care provider before you return to your activities.  Physical and mental rest and careful observation, usually at home. Gradually return to your normal activities.  Medicines to help with symptoms such as headaches, nausea, or difficulty sleeping. ? Avoid taking opioid pain medicine while recovering from a concussion.  Avoiding alcohol and drugs. These may slow your recovery and can put you at risk of further injury.  Referral to a concussion clinic or rehabilitation center. Recovery from a concussion can take time. How fast you recover depends on many factors. Return to activities only when:  Your symptoms are completely gone.  Your health care provider says that it is safe. Follow these instructions at home: Activity  Limit activities that require a lot of thought or concentration, such as: ? Doing homework or job-related work. ? Watching TV. ? Working on the computer or phone. ? Playing memory games and puzzles.  Rest. Rest helps your brain heal. Make sure you: ? Get plenty of sleep. Most adults should get 7-9 hours of sleep each  night. ? Rest during the day. Take naps or rest breaks when you feel tired.  Avoid physical activity like exercise until your health care provider says it is safe. Stop any activity that worsens symptoms.  Do not do high-risk activities that could cause a second concussion, such as riding a bike or playing sports.  Ask your health care provider when you can return to your normal activities, such as school, work, athletics, and driving. Your ability to react may be slower after a brain injury. Never do these activities if you are dizzy. Your health care provider will likely give you a plan for gradually returning to activities. General instructions   Take over-the-counter and prescription medicines only as told by your health care provider. Some medicines, such as blood thinners (anticoagulants) and aspirin, may increase the risk for complications, such as bleeding.  Do not drink alcohol until your health care provider says you can.  Watch your symptoms and tell others around you to do the same. Complications sometimes occur after a concussion. Older adults with a brain injury may have a higher risk of serious complications.  Tell your work Freight forwarder, teachers, Government social research officer, school counselor, coach, or Product/process development scientist about your injury, symptoms, and restrictions.  Keep all follow-up visits as told by your health care provider. This is important. How is this prevented? Avoiding another brain injury is very important. In rare cases, another injury can lead to permanent brain damage, brain swelling, or death. The risk of this is greatest during the first 7-10 days after a head injury.  Avoid injuries by:  Stopping activities that could lead to a second concussion, such as contact or recreational sports, until your health care provider says it is okay.  Taking these actions once you have returned to sports or activities: ? Avoiding plays or moves that can cause you to crash into another person.  This is how most concussions occur. ? Following the rules and being respectful of other players. Do not engage in violent or illegal plays.  Getting regular exercise that includes strength and balance training.  Wearing a properly fitting helmet during sports, biking, or other activities. Helmets can help protect you from serious skull and brain injuries, but they do not protect you from a concussion. Even when wearing a helmet, you should avoid being hit in the head. Contact a health care provider if:  Your symptoms get worse or they do not improve.  You have new symptoms.  You have another injury. Get help right away if:  You have severe or worsening headaches.  You have weakness or numbness in any part of your body.  You are confused.  Your coordination gets worse.  You vomit repeatedly.  You are sleepier than normal.  Your speech is slurred.  You cannot recognize people or places.  You have a seizure.  It is difficult to wake you up.  You have unusual behavior changes.  You have changes in your vision.  You lose consciousness. Summary  A concussion is a brain injury that results from a hard, direct hit (trauma) to your head or body.  You may have imaging tests and neuropsychological tests to diagnose a concussion.  Treatment for this condition includes physical and mental rest and careful observation.  Ask your health care provider when you can return to your normal activities, such as school, work, athletics, and driving.  Get help right away if you have a severe headache, weakness on one side of the body, seizures, behavior changes, changes in vision, or if you are confused or sleepier than normal. This information is not intended to replace advice given to you by your health care provider. Make sure you discuss any questions you have with your health care provider. Document Revised: 03/08/2018 Document Reviewed: 03/08/2018 Elsevier Patient Education  [place].

## 2020-06-12 NOTE — Progress Notes (Signed)
OT Cancellation Note  Patient Details Name: Kevin [name] MRN: [id number] DOB: Feb 17, 1966   Cancelled Treatment:    Reason Eval/Treat Not Completed: Other (comment)."I've been up and going since 7:00 this morning", "I've done my physical therapy", "they have to get my meds figured out first", " I need to talk to my doctor about my jaw", "this is not the time for therapy right now". Explained to pt why we (OT/PT) where there and asked if we could come back later if our schedule allowed--he did not totally agree to this, but said we could try.  [place] Circle, OTR/L Acute Rehab Services Pager [phone] Office [phone]    Almon Register 06/12/2020, 10:46 AM
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
## 2020-06-12 NOTE — TOC CAGE-AID Note (Signed)
Transition of Care [facility]) - CAGE-AID Screening   Patient Details  Name: Kevin [name] MRN: [id number] Date of Birth: Jul 28, 1966  Transition of Care [facility]) CM/SW Contact:    Emeterio Reeve, [place] Phone Number: 06/12/2020, 3:57 PM   Clinical Narrative: CSW met with pt at bedside. CSW introduced self and explained role at the hospital.  Pt reports social alcohol use. Pt denies substance use. Pt did not need any resources at this time.     CAGE-AID Screening:    Have You Ever Felt You Ought to Cut Down on Your Drinking or Drug Use?: No Have People Annoyed You By Critizing Your Drinking Or Drug Use?: No Have You Felt Bad Or Guilty About Your Drinking Or Drug Use?: No Have You Ever Had a Drink or Used Drugs First Thing In The Morning to Steady Your Nerves or to Get Rid of a Hangover?: No CAGE-AID Score: 0  Substance Abuse Education Offered: Yes    Blima Ledger, [place] Social Worker [phone]
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
## 2020-06-13 LAB — BASIC METABOLIC PANEL
Anion gap: 8 (ref 5–15)
BUN: 13 mg/dL (ref 6–20)
CO2: 25 mmol/L (ref 22–32)
Calcium: 9.2 mg/dL (ref 8.9–10.3)
Chloride: 105 mmol/L (ref 98–111)
Creatinine, Ser: 0.94 mg/dL (ref 0.61–1.24)
GFR, Estimated: >60 mL/min (ref >60)
Glucose, Bld: 100 mg/dL — ABNORMAL HIGH (ref 70–99)
Potassium: 3.8 mmol/L (ref 3.5–5.1)
Sodium: 138 mmol/L (ref 135–145)

## 2020-06-13 LAB — CBC
HCT: 30.3 % — ABNORMAL LOW (ref 39.0–52.0)
Hemoglobin: 10.4 g/dL — ABNORMAL LOW (ref 13.0–17.0)
MCH: 31 pg (ref 26.0–34.0)
MCHC: 34.3 g/dL (ref 30.0–36.0)
MCV: 90.2 fL (ref 80.0–100.0)
Platelets: 73 10*3/uL — ABNORMAL LOW (ref 150–400)
RBC: 3.36 MIL/uL — ABNORMAL LOW (ref 4.22–5.81)
RDW: 12 % (ref 11.5–15.5)
WBC: 8.7 10*3/uL (ref 4.0–10.5)
nRBC: 0 % (ref 0.0–0.2)

## 2020-06-13 MED ORDER — GABAPENTIN 400 MG PO CAPS
400.0000 mg | ORAL_CAPSULE | Freq: Three times a day (TID) | ORAL | Status: DC
  Administered 2020-06-13 (×3): 400 mg via ORAL
  Filled 2020-06-13 (×3): qty 1

## 2020-06-13 MED ORDER — GABAPENTIN 800 MG PO TABS
400.0000 mg | ORAL_TABLET | Freq: Three times a day (TID) | ORAL | Status: DC
  Filled 2020-06-13: qty 0.5
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
## 2020-06-14 LAB — CBC
HCT: 29.2 % — ABNORMAL LOW (ref 39.0–52.0)
Hemoglobin: 9.9 g/dL — ABNORMAL LOW (ref 13.0–17.0)
MCH: 30.7 pg (ref 26.0–34.0)
MCHC: 33.9 g/dL (ref 30.0–36.0)
MCV: 90.7 fL (ref 80.0–100.0)
Platelets: 77 10*3/uL — ABNORMAL LOW (ref 150–400)
RBC: 3.22 MIL/uL — ABNORMAL LOW (ref 4.22–5.81)
RDW: 12 % (ref 11.5–15.5)
WBC: 7.4 10*3/uL (ref 4.0–10.5)
nRBC: 0 % (ref 0.0–0.2)

## 2020-06-14 LAB — CBC WITH DIFFERENTIAL/PLATELET
Abs Immature Granulocytes: 0.02 10*3/uL (ref 0.00–0.07)
Basophils Absolute: 0 10*3/uL (ref 0.0–0.1)
Basophils Relative: 0 %
Eosinophils Absolute: 0.4 10*3/uL (ref 0.0–0.5)
Eosinophils Relative: 5 %
HCT: 30.6 % — ABNORMAL LOW (ref 39.0–52.0)
Hemoglobin: 10.7 g/dL — ABNORMAL LOW (ref 13.0–17.0)
Immature Granulocytes: 0 %
Lymphocytes Relative: 12 %
Lymphs Abs: 0.9 10*3/uL (ref 0.7–4.0)
MCH: 31.3 pg (ref 26.0–34.0)
MCHC: 35 g/dL (ref 30.0–36.0)
MCV: 89.5 fL (ref 80.0–100.0)
Monocytes Absolute: 0.5 10*3/uL (ref 0.1–1.0)
Monocytes Relative: 7 %
Neutro Abs: 6.1 10*3/uL (ref 1.7–7.7)
Neutrophils Relative %: 76 %
Platelets: 90 10*3/uL — ABNORMAL LOW (ref 150–400)
RBC: 3.42 MIL/uL — ABNORMAL LOW (ref 4.22–5.81)
RDW: 12.1 % (ref 11.5–15.5)
WBC: 7.9 10*3/uL (ref 4.0–10.5)
nRBC: 0 % (ref 0.0–0.2)

## 2020-06-14 MED ORDER — CHLORHEXIDINE GLUCONATE 0.12 % MT SOLN
15.0000 mL | Freq: Four times a day (QID) | OROMUCOSAL | Status: DC
  Administered 2020-06-14 – 2020-06-15 (×5): 15 mL via OROMUCOSAL
  Filled 2020-06-14 (×5): qty 15

## 2020-06-14 MED ORDER — GABAPENTIN 600 MG PO TABS
600.0000 mg | ORAL_TABLET | Freq: Three times a day (TID) | ORAL | Status: DC
  Administered 2020-06-14 – 2020-06-15 (×5): 600 mg via ORAL
  Filled 2020-06-14 (×5): qty 1

## 2020-06-14 MED ORDER — ZOLPIDEM TARTRATE 5 MG PO TABS
5.0000 mg | ORAL_TABLET | Freq: Every evening | ORAL | Status: DC | PRN
  Administered 2020-06-14: 5 mg via ORAL
  Filled 2020-06-14: qty 1
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
## 2020-06-15 ENCOUNTER — Other Ambulatory Visit (HOSPITAL_COMMUNITY): Payer: Self-pay | Admitting: Physician Assistant

## 2020-06-15 LAB — CBC WITH DIFFERENTIAL/PLATELET
Abs Immature Granulocytes: 0.03 10*3/uL (ref 0.00–0.07)
Basophils Absolute: 0 10*3/uL (ref 0.0–0.1)
Basophils Relative: 0 %
Eosinophils Absolute: 0.3 10*3/uL (ref 0.0–0.5)
Eosinophils Relative: 5 %
HCT: 30.4 % — ABNORMAL LOW (ref 39.0–52.0)
Hemoglobin: 10.6 g/dL — ABNORMAL LOW (ref 13.0–17.0)
Immature Granulocytes: 0 %
Lymphocytes Relative: 16 %
Lymphs Abs: 1.1 10*3/uL (ref 0.7–4.0)
MCH: 31 pg (ref 26.0–34.0)
MCHC: 34.9 g/dL (ref 30.0–36.0)
MCV: 88.9 fL (ref 80.0–100.0)
Monocytes Absolute: 0.7 10*3/uL (ref 0.1–1.0)
Monocytes Relative: 11 %
Neutro Abs: 4.6 10*3/uL (ref 1.7–7.7)
Neutrophils Relative %: 68 %
Platelets: 109 10*3/uL — ABNORMAL LOW (ref 150–400)
RBC: 3.42 MIL/uL — ABNORMAL LOW (ref 4.22–5.81)
RDW: 12.1 % (ref 11.5–15.5)
WBC: 6.8 10*3/uL (ref 4.0–10.5)
nRBC: 0 % (ref 0.0–0.2)

## 2020-06-15 LAB — BASIC METABOLIC PANEL
Anion gap: 9 (ref 5–15)
BUN: 13 mg/dL (ref 6–20)
CO2: 28 mmol/L (ref 22–32)
Calcium: 9.6 mg/dL (ref 8.9–10.3)
Chloride: 104 mmol/L (ref 98–111)
Creatinine, Ser: 0.97 mg/dL (ref 0.61–1.24)
GFR, Estimated: >60 mL/min (ref >60)
Glucose, Bld: 106 mg/dL — ABNORMAL HIGH (ref 70–99)
Potassium: 4.3 mmol/L (ref 3.5–5.1)
Sodium: 141 mmol/L (ref 135–145)

## 2020-06-15 MED ORDER — LIDOCAINE 5 % EX PTCH: 1.0000 | MEDICATED_PATCH | CUTANEOUS | 0 refills | Status: AC

## 2020-06-15 MED ORDER — GABAPENTIN 600 MG PO TABS
600.0000 mg | ORAL_TABLET | Freq: Three times a day (TID) | ORAL | 0 refills | Status: DC | PRN
Start: 2020-06-15 — End: 2020-08-01

## 2020-06-15 MED ORDER — METHOCARBAMOL 500 MG PO TABS: 1000.0000 mg | ORAL_TABLET | Freq: Three times a day (TID) | ORAL | 0 refills | Status: DC | PRN

## 2020-06-15 MED ORDER — BACITRACIN ZINC 500 UNIT/GM EX OINT: TOPICAL_OINTMENT | Freq: Two times a day (BID) | CUTANEOUS | 0 refills | Status: AC

## 2020-06-15 MED ORDER — AMOXICILLIN-POT CLAVULANATE 500-125 MG PO TABS
1.0000 | ORAL_TABLET | Freq: Two times a day (BID) | ORAL | 0 refills | Status: DC
Start: 2020-06-15 — End: 2020-06-15

## 2020-06-15 MED ORDER — ACETAMINOPHEN 500 MG PO TABS
1000.0000 mg | ORAL_TABLET | Freq: Three times a day (TID) | ORAL | 0 refills | Status: DC | PRN
Start: 2020-06-15 — End: 2020-06-21

## 2020-06-15 MED ORDER — OXYCODONE HCL 5 MG PO TABS: 10.0000 mg | ORAL_TABLET | Freq: Four times a day (QID) | ORAL | 0 refills | Status: DC | PRN

## 2020-06-15 MED FILL — oxyCODONE HCL 5 MG TABS: 5 | 5 days supply | Qty: 30 | Fill #0

## 2020-06-15 MED FILL — METHOCARBAMOL 500 MG TABS: 500 | 7 days supply | Qty: 40 | Fill #0

## 2020-06-15 MED FILL — AMOX-CLAV 500-125 MG TABLET: 500-125 | 7 days supply | Qty: 14 | Fill #0

## 2020-06-15 MED FILL — GABAPENTIN 600 MG TABLET: 600 | 6 days supply | Qty: 20 | Fill #0

## 2020-06-15 NOTE — Progress Notes (Signed)
Pt discharged home via rideservice.  All discharge instructions, follow-up appts., and medications reviewed.  Meds delivered by pharmacy prior to departure.  Pt confirmed all belongings were accounted for.  NAD.

## 2020-06-15 NOTE — TOC Transition Note (Signed)
Transition of Care [facility]) - CM/SW Discharge Note   Patient Details  Name: Kevin [name] MRN: [id number] Date of Birth: 21-Jul-1966  Transition of Care [facility]) CM/SW Contact:  Ella Bodo, RN Phone Number: 06/15/2020, 5:28 PM   Clinical Narrative:  Pt medically stable for discharge home today.  Pt declines request for OP SLP referral to be made.  Pt states he lives alone, but may be going to stay with a friend at discharge.  Pt has Medicaid of [place], so unfortunately his DME will not be covered in [place].  He has been in [place] 6 months, and I encouraged him to get his Medicaid changed to [facility] Medicaid.  He is agreeable to follow up at [facility] and [facility], and follow up appointment made. Pt states he may need ride home.  Will follow for assistance as needed.       Final next level of care: (P) Home/Self Care Barriers to Discharge: (P) Barriers Resolved                       Discharge Plan and Services   Discharge Planning Services: (P) [facility], Follow-up appt scheduled                     Social Determinants of Health (SDOH) Interventions     Readmission Risk Interventions Readmission Risk Prevention Plan 06/15/2020 06/15/2020  Post Dischage Appt Complete Complete  Medication Screening Complete Complete  Transportation Screening Complete Complete   Reinaldo Raddle, RN, BSN  Trauma/Neuro ICU Case Manager [phone]
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
## 2020-06-16 ENCOUNTER — Encounter (HOSPITAL_COMMUNITY): Payer: Self-pay

## 2020-06-21 ENCOUNTER — Emergency Department (HOSPITAL_COMMUNITY)
Admission: EM | Admit: 2020-06-21 | Discharge: 2020-06-22 | Disposition: A | Discharge status: Home / Self Care | Payer: Medicaid - Out of State | Attending: Emergency Medicine | Admitting: Emergency Medicine

## 2020-06-21 ENCOUNTER — Other Ambulatory Visit: Payer: Self-pay

## 2020-06-21 ENCOUNTER — Emergency Department (HOSPITAL_COMMUNITY): Payer: Medicaid - Out of State

## 2020-06-21 DIAGNOSIS — Y848 Other medical procedures as the cause of abnormal reaction of the patient, or of later complication, without mention of misadventure at the time of the procedure: Secondary | ICD-10-CM | POA: Insufficient documentation

## 2020-06-21 DIAGNOSIS — F17210 Nicotine dependence, cigarettes, uncomplicated: Secondary | ICD-10-CM | POA: Diagnosis not present

## 2020-06-21 DIAGNOSIS — T8140XA Infection following a procedure, unspecified, initial encounter: Secondary | ICD-10-CM | POA: Diagnosis not present

## 2020-06-21 DIAGNOSIS — Z85528 Personal history of other malignant neoplasm of kidney: Secondary | ICD-10-CM | POA: Diagnosis not present

## 2020-06-21 DIAGNOSIS — Z79899 Other long term (current) drug therapy: Secondary | ICD-10-CM | POA: Diagnosis not present

## 2020-06-21 DIAGNOSIS — I10 Essential (primary) hypertension: Secondary | ICD-10-CM | POA: Diagnosis not present

## 2020-06-21 DIAGNOSIS — R6884 Jaw pain: Secondary | ICD-10-CM | POA: Diagnosis present

## 2020-06-21 LAB — CBC WITH DIFFERENTIAL/PLATELET
Abs Immature Granulocytes: 0.07 10*3/uL (ref 0.00–0.07)
Basophils Absolute: 0 10*3/uL (ref 0.0–0.1)
Basophils Relative: 0 %
Eosinophils Absolute: 0.3 10*3/uL (ref 0.0–0.5)
Eosinophils Relative: 4 %
HCT: 34 % — ABNORMAL LOW (ref 39.0–52.0)
Hemoglobin: 12.2 g/dL — ABNORMAL LOW (ref 13.0–17.0)
Immature Granulocytes: 1 %
Lymphocytes Relative: 26 %
Lymphs Abs: 2.2 10*3/uL (ref 0.7–4.0)
MCH: 32.3 pg (ref 26.0–34.0)
MCHC: 35.9 g/dL (ref 30.0–36.0)
MCV: 89.9 fL (ref 80.0–100.0)
Monocytes Absolute: 0.7 10*3/uL (ref 0.1–1.0)
Monocytes Relative: 9 %
Neutro Abs: 5.2 10*3/uL (ref 1.7–7.7)
Neutrophils Relative %: 60 %
Platelets: 279 10*3/uL (ref 150–400)
RBC: 3.78 MIL/uL — ABNORMAL LOW (ref 4.22–5.81)
RDW: 13.2 % (ref 11.5–15.5)
WBC: 8.5 10*3/uL (ref 4.0–10.5)
nRBC: 0 % (ref 0.0–0.2)

## 2020-06-21 LAB — BASIC METABOLIC PANEL
Anion gap: 9 (ref 5–15)
BUN: 16 mg/dL (ref 6–20)
CO2: 24 mmol/L (ref 22–32)
Calcium: 9.2 mg/dL (ref 8.9–10.3)
Chloride: 103 mmol/L (ref 98–111)
Creatinine, Ser: 0.81 mg/dL (ref 0.61–1.24)
GFR, Estimated: >60 mL/min (ref >60)
Glucose, Bld: 91 mg/dL (ref 70–99)
Potassium: 3.7 mmol/L (ref 3.5–5.1)
Sodium: 136 mmol/L (ref 135–145)

## 2020-06-21 MED ORDER — MORPHINE SULFATE (PF) 4 MG/ML IV SOLN
4.0000 mg | Freq: Once | INTRAVENOUS | Status: AC
  Administered 2020-06-21: 4 mg via INTRAVENOUS
  Filled 2020-06-21: qty 1

## 2020-06-21 MED ORDER — IOHEXOL 300 MG/ML  SOLN
75.0000 mL | Freq: Once | INTRAMUSCULAR | Status: AC | PRN
  Administered 2020-06-21: 75 mL via INTRAVENOUS

## 2020-06-21 MED ORDER — ONDANSETRON HCL 4 MG/2ML IJ SOLN
4.0000 mg | Freq: Once | INTRAMUSCULAR | Status: AC
  Administered 2020-06-21: 4 mg via INTRAVENOUS
  Filled 2020-06-21: qty 2

## 2020-06-21 NOTE — ED Notes (Signed)
Pt refuses to get on stretcher. Wants to stay in [facility]
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
## 2020-06-22 MED ORDER — ACETAMINOPHEN 325 MG PO TABS
650.0000 mg | ORAL_TABLET | Freq: Once | ORAL | Status: AC
  Administered 2020-06-22: 650 mg via ORAL
  Filled 2020-06-22: qty 2

## 2020-06-22 MED ORDER — CLINDAMYCIN PHOSPHATE 900 MG/50ML IV SOLN
900.0000 mg | Freq: Once | INTRAVENOUS | Status: AC
  Administered 2020-06-22: 900 mg via INTRAVENOUS
  Filled 2020-06-22: qty 50

## 2020-06-22 MED ORDER — CLINDAMYCIN HCL 150 MG PO CAPS: ORAL_CAPSULE | ORAL | 0 refills | Status: AC

## 2020-06-22 MED ORDER — IBUPROFEN 400 MG PO TABS
600.0000 mg | ORAL_TABLET | Freq: Once | ORAL | Status: AC
  Administered 2020-06-22: 600 mg via ORAL
  Filled 2020-06-22: qty 2
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
## 2020-06-22 NOTE — Discharge Instructions (Addendum)
Please call Dr. Victorio Palm office this morning.  Let them know you came to the emergency room tonight and there is a possible infection in your surgical site.  The ED talk to Dr. Constance Holster who wanted you to be seen in the office.  Please start the prescribed antibiotics.  Use ice packs on your face for comfort.

## 2020-06-24 LAB — AEROBIC CULTURE W GRAM STAIN (SUPERFICIAL SPECIMEN)

## 2020-06-25 ENCOUNTER — Telehealth: Payer: Self-pay | Admitting: *Deleted

## 2020-06-25 NOTE — Telephone Encounter (Signed)
Post ED Visit - Positive Culture Follow-up  Culture report reviewed by antimicrobial stewardship pharmacist: [facility] []  Elenor Quinones, Pharm.D. []  Heide Guile, Pharm.D., BCPS AQ-ID []  Parks Neptune, Pharm.D., BCPS []  Alycia Rossetti, Pharm.D., BCPS []  [place], Pharm.D., BCPS, AAHIVP []  Legrand Como, Pharm.D., BCPS, AAHIVP []  Salome Arnt, PharmD, BCPS []  Johnnette Gourd, PharmD, BCPS []  Hughes Better, PharmD, BCPS []  Leeroy Cha, PharmD []  Laqueta Linden, PharmD, BCPS []  Albertina Parr, PharmD  [place] Team []  Leodis Sias, PharmD []  Lindell Spar, PharmD []  Royetta Asal, PharmD []  Graylin Shiver, Rph []  Rema Fendt) Glennon Mac, PharmD []  Arlyn Dunning, PharmD []  Netta Cedars, PharmD []  Dia Sitter, PharmD []  Leone Haven, PharmD []  Gretta Arab, PharmD []  Theodis Shove, PharmD []  Peggyann Juba, PharmD []  Reuel Boom, PharmD   Positive urine culture Likely not pathogenic.  Patient has follow up in place and no further patient follow-up is required at this time.  Mercy Riding, PharmD Harlon Flor Talley 06/25/2020, 2:48 PM

## 2020-07-06 ENCOUNTER — Telehealth: Payer: Self-pay | Admitting: General Practice

## 2020-07-06 NOTE — Telephone Encounter (Signed)
Called patient and LVM to reschedule HFU with another provider since Dr. Chapman Fitch is no longer with our clinic.

## 2020-07-10 ENCOUNTER — Inpatient Hospital Stay: Payer: Medicaid - Out of State | Admitting: Family Medicine

## 2020-08-01 ENCOUNTER — Other Ambulatory Visit: Payer: Self-pay

## 2020-08-01 ENCOUNTER — Emergency Department (HOSPITAL_COMMUNITY): Payer: Medicaid - Out of State

## 2020-08-01 ENCOUNTER — Emergency Department (HOSPITAL_COMMUNITY)
Admission: EM | Admit: 2020-08-01 | Discharge: 2020-08-01 | Disposition: A | Discharge status: Home / Self Care | Payer: Medicaid - Out of State | Attending: Emergency Medicine | Admitting: Emergency Medicine

## 2020-08-01 ENCOUNTER — Encounter (HOSPITAL_COMMUNITY): Payer: Self-pay | Admitting: Emergency Medicine

## 2020-08-01 DIAGNOSIS — F17290 Nicotine dependence, other tobacco product, uncomplicated: Secondary | ICD-10-CM | POA: Diagnosis not present

## 2020-08-01 DIAGNOSIS — F17210 Nicotine dependence, cigarettes, uncomplicated: Secondary | ICD-10-CM | POA: Insufficient documentation

## 2020-08-01 DIAGNOSIS — I10 Essential (primary) hypertension: Secondary | ICD-10-CM | POA: Diagnosis not present

## 2020-08-01 DIAGNOSIS — Z85828 Personal history of other malignant neoplasm of skin: Secondary | ICD-10-CM | POA: Diagnosis not present

## 2020-08-01 DIAGNOSIS — Z79899 Other long term (current) drug therapy: Secondary | ICD-10-CM | POA: Diagnosis not present

## 2020-08-01 DIAGNOSIS — M79605 Pain in left leg: Secondary | ICD-10-CM | POA: Diagnosis not present

## 2020-08-01 DIAGNOSIS — Z96642 Presence of left artificial hip joint: Secondary | ICD-10-CM | POA: Diagnosis not present

## 2020-08-01 MED ORDER — GABAPENTIN 100 MG PO CAPS
ORAL_CAPSULE | ORAL | 1 refills | Status: AC
Start: 2020-08-01 — End: ?
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
## 2020-08-01 NOTE — Discharge Instructions (Addendum)
Follow up with dr. Hilda Lias or one of his partners  in 1-2 weeks
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
## 2020-10-16 ENCOUNTER — Encounter (HOSPITAL_COMMUNITY): Payer: Self-pay | Admitting: *Deleted

## 2020-10-16 ENCOUNTER — Other Ambulatory Visit: Payer: Self-pay

## 2020-10-16 ENCOUNTER — Emergency Department (HOSPITAL_COMMUNITY)
Admission: EM | Admit: 2020-10-16 | Discharge: 2020-10-16 | Disposition: A | Discharge status: Home / Self Care | Payer: Medicaid - Out of State | Attending: Emergency Medicine | Admitting: Emergency Medicine

## 2020-10-16 DIAGNOSIS — Z96642 Presence of left artificial hip joint: Secondary | ICD-10-CM | POA: Diagnosis not present

## 2020-10-16 DIAGNOSIS — Z79899 Other long term (current) drug therapy: Secondary | ICD-10-CM | POA: Insufficient documentation

## 2020-10-16 DIAGNOSIS — I10 Essential (primary) hypertension: Secondary | ICD-10-CM | POA: Insufficient documentation

## 2020-10-16 DIAGNOSIS — F17210 Nicotine dependence, cigarettes, uncomplicated: Secondary | ICD-10-CM | POA: Diagnosis not present

## 2020-10-16 DIAGNOSIS — F10920 Alcohol use, unspecified with intoxication, uncomplicated: Secondary | ICD-10-CM

## 2020-10-16 DIAGNOSIS — Z85528 Personal history of other malignant neoplasm of kidney: Secondary | ICD-10-CM | POA: Insufficient documentation

## 2020-10-16 DIAGNOSIS — F10129 Alcohol abuse with intoxication, unspecified: Secondary | ICD-10-CM | POA: Insufficient documentation

## 2020-10-16 LAB — CBC WITH DIFFERENTIAL/PLATELET
Abs Immature Granulocytes: 0.02 10*3/uL (ref 0.00–0.07)
Basophils Absolute: 0 10*3/uL (ref 0.0–0.1)
Basophils Relative: 0 %
Eosinophils Absolute: 0.2 10*3/uL (ref 0.0–0.5)
Eosinophils Relative: 2 %
HCT: 40.5 % (ref 39.0–52.0)
Hemoglobin: 14.2 g/dL (ref 13.0–17.0)
Immature Granulocytes: 0 %
Lymphocytes Relative: 26 %
Lymphs Abs: 2 10*3/uL (ref 0.7–4.0)
MCH: 31 pg (ref 26.0–34.0)
MCHC: 35.1 g/dL (ref 30.0–36.0)
MCV: 88.4 fL (ref 80.0–100.0)
Monocytes Absolute: 0.5 10*3/uL (ref 0.1–1.0)
Monocytes Relative: 6 %
Neutro Abs: 5 10*3/uL (ref 1.7–7.7)
Neutrophils Relative %: 66 %
Platelets: 194 10*3/uL (ref 150–400)
RBC: 4.58 MIL/uL (ref 4.22–5.81)
RDW: 12.6 % (ref 11.5–15.5)
WBC: 7.6 10*3/uL (ref 4.0–10.5)
nRBC: 0 % (ref 0.0–0.2)

## 2020-10-16 LAB — RAPID URINE DRUG SCREEN, HOSP PERFORMED
Amphetamines: NOT DETECTED
Barbiturates: NOT DETECTED
Benzodiazepines: NOT DETECTED
Cocaine: NOT DETECTED
Opiates: NOT DETECTED
Tetrahydrocannabinol: NOT DETECTED

## 2020-10-16 LAB — COMPREHENSIVE METABOLIC PANEL
ALT: 13 U/L (ref 0–44)
AST: 18 U/L (ref 15–41)
Albumin: 4.3 g/dL (ref 3.5–5.0)
Alkaline Phosphatase: 73 U/L (ref 38–126)
Anion gap: 9 (ref 5–15)
BUN: 16 mg/dL (ref 6–20)
CO2: 26 mmol/L (ref 22–32)
Calcium: 9.3 mg/dL (ref 8.9–10.3)
Chloride: 104 mmol/L (ref 98–111)
Creatinine, Ser: 1.13 mg/dL (ref 0.61–1.24)
GFR, Estimated: >60 mL/min (ref >60)
Glucose, Bld: 103 mg/dL — ABNORMAL HIGH (ref 70–99)
Potassium: 4.9 mmol/L (ref 3.5–5.1)
Sodium: 139 mmol/L (ref 135–145)
Total Bilirubin: 0.4 mg/dL (ref 0.3–1.2)
Total Protein: 7.4 g/dL (ref 6.5–8.1)

## 2020-10-16 LAB — ACETAMINOPHEN LEVEL: Acetaminophen (Tylenol), Serum: <10 ug/mL — ABNORMAL LOW (ref 10–30)

## 2020-10-16 LAB — SALICYLATE LEVEL: Salicylate Lvl: <7 mg/dL — ABNORMAL LOW (ref 7.0–30.0)

## 2020-10-16 LAB — ETHANOL: Alcohol, Ethyl (B): 217 mg/dL — ABNORMAL HIGH (ref <10)

## 2020-10-16 MED ORDER — CHLORDIAZEPOXIDE HCL 25 MG PO CAPS: ORAL_CAPSULE | ORAL | 0 refills | Status: AC

## 2020-10-16 MED ORDER — LORAZEPAM 2 MG/ML IJ SOLN
0.5000 mg | Freq: Once | INTRAMUSCULAR | Status: AC
  Administered 2020-10-16: 0.5 mg via INTRAVENOUS
  Filled 2020-10-16: qty 1

## 2020-10-16 NOTE — ED Provider Notes (Signed)
[place] Provider Note   CSN: [id number] Arrival date & time: 10/16/20  1753     History Chief Complaint  Patient presents with  . Alcohol Problem    Kevin [name] is a 55 y.o. male history of anxiety, arthritis, kidney cancer, depression, alcohol abuse, hypertension, migraines  Patient presents to the ER today for alcohol detox.  Patient reports that he has been sober for around 1 year before relapsing this evening, he reports he drank around a "pint" of liquor prior to arrival.  He reports that he has had increased stress and anxiety in his life which led him to relapse today he did not want to discuss this further.  He denies any homicidal or suicidal ideations or hallucinations.  He denies any physical pain or complaints at this time.  He reports that several years ago when detoxing from alcohol he suffered severe withdrawal as well as withdrawal seizures.  HPI     Past Medical History:  Diagnosis Date  . Anxiety   . Arthritis   . Cancer ([place])    kidney  . Depression   . Former consumption of alcohol    quit 2013  . Hypertension   . Insomnia   . Left renal mass 2012   renal cell carcinoma, completely excised  . Migraines   . Renal cell cancer, left [facility]) 2012    Patient Active Problem List   Diagnosis Date Noted  . Trauma 06/10/2020  . Mandible open fracture ([place]) 06/10/2020  . Left rib fracture 06/10/2020  . Left fibular fracture 06/10/2020  . Splenic laceration 06/10/2020  . Osteoarthritis of left hip 04/29/2016  . Status post left hip replacement 04/29/2016    Past Surgical History:  Procedure Laterality Date  . MANDIBLE FRACTURE SURGERY  age 76  . ORIF MANDIBULAR FRACTURE N/A 06/10/2020   Procedure: OPEN REDUCTION INTERNAL FIXATION (ORIF) MANDIBULAR FRACTURE;  Surgeon: Jerrell Belfast, MD;  Location: [place];  Service: ENT;  Laterality: N/A;  . ROBOTIC ASSITED PARTIAL NEPHRECTOMY Left 2012  . TOTAL HIP ARTHROPLASTY Left  04/29/2016   Procedure: LEFT TOTAL HIP ARTHROPLASTY ANTERIOR APPROACH;  Surgeon: Mcarthur Rossetti, MD;  Location: [facility];  Service: Orthopedics;  Laterality: Left;  . TOTAL HIP ARTHROPLASTY Left 2017       Family History  Problem Relation Age of Onset  . Cancer Mother     Social History   Tobacco Use  . Smoking status: Current Every Day Smoker    Packs/day: 1.00    Years: 30.00    Pack years: 30.00    Types: Cigarettes, E-cigarettes  . Smokeless tobacco: Never Used  Vaping Use  . Vaping Use: Former  Substance Use Topics  . Alcohol use: Yes    Comment: fifth of liquor daily   . Drug use: No    Home Medications Prior to Admission medications   Medication Sig Start Date End Date Taking? Authorizing Provider  chlordiazePOXIDE (LIBRIUM) 25 MG capsule 50mg  PO TID x 1D, then 25-50mg  PO BID X 1D, then 25-50mg  PO QD X 1D 10/16/20  Yes Nuala Alpha A, PA-C  amoxicillin-clavulanate (AUGMENTIN) 500-125 MG tablet Take 1 tablet (500 mg total) by mouth 2 (two) times daily. 06/15/20   Maczis, Barth Kirks, PA-C  bacitracin ointment Apply topically 2 (two) times daily. 06/15/20   Maczis, Barth Kirks, PA-C  buprenorphine-naloxone (SUBOXONE) 8-2 mg SUBL SL tablet Place 1 tablet under the tongue in the morning and at bedtime.    [provider]  clindamycin (CLEOCIN) 150 MG capsule Take 2 po QID 06/22/20   Rolland Porter, MD  cyclobenzaprine (FLEXERIL) 10 MG tablet Take 1 tablet (10 mg total) by mouth 3 (three) times daily as needed for muscle spasms. Patient not taking: Reported on 29/93/7169 67/89/38   Delora Fuel, MD  FLUoxetine (PROZAC) 20 MG tablet Take 20 mg by mouth daily.    [provider]  gabapentin (NEURONTIN) 100 MG capsule Take one every 8 hours as needed for pain 08/01/20   Milton Ferguson, MD  lidocaine (LIDODERM) 5 % Place 1 patch onto the skin daily. Remove & Discard patch within 12 hours or as directed by MD Patient not taking: Reported on 06/21/2020 06/16/20    Maczis, Barth Kirks, PA-C  methocarbamol (ROBAXIN) 500 MG tablet Take 2 tablets (1,000 mg total) by mouth every 8 (eight) hours as needed for muscle spasms. 06/15/20   Maczis, Barth Kirks, PA-C  metoprolol tartrate (LOPRESSOR) 50 MG tablet Take 50 mg by mouth 2 (two) times daily.     [provider]  oxyCODONE (OXY IR/ROXICODONE) 5 MG immediate release tablet Take 2 tablets (10 mg total) by mouth every 6 (six) hours as needed for breakthrough pain. Patient not taking: Reported on 06/21/2020 06/15/20   Maczis, Barth Kirks, PA-C  penicillin v potassium (VEETID) 500 MG tablet Take 1 tablet (500 mg total) by mouth 3 (three) times daily. Patient not taking: Reported on 06/21/2020 05/16/20   Dorie Rank, MD  triamcinolone cream (KENALOG) 0.1 % Apply 1 application topically 2 (two) times daily. 02/29/20   Margarita Mail, PA-C    Allergies    Patient has no known allergies.  Review of Systems   Review of Systems Ten systems are reviewed and are negative for acute change except as noted in the HPI  Physical Exam Updated Vital Signs BP (!) 94/58 (BP Location: Left Arm)   Pulse 78   Temp 97.9 F (36.6 C) (Oral)   Resp 11   Ht 5\' 6"  (1.676 m)   Wt 81.6 kg   SpO2 96%   BMI 29.05 kg/m   Physical Exam Constitutional:      General: He is not in acute distress.    Appearance: Normal appearance. He is well-developed. He is not ill-appearing or diaphoretic.  HENT:     Head: Normocephalic and atraumatic.  Eyes:     General: Vision grossly intact. Gaze aligned appropriately.     Pupils: Pupils are equal, round, and reactive to light.  Neck:     Trachea: Trachea and phonation normal.  Pulmonary:     Effort: Pulmonary effort is normal. No respiratory distress.  Abdominal:     General: There is no distension.     Palpations: Abdomen is soft.     Tenderness: There is no abdominal tenderness. There is no guarding or rebound.  Musculoskeletal:        General: Normal range of motion.      Cervical back: Normal range of motion.  Skin:    General: Skin is warm and dry.  Neurological:     Mental Status: He is alert.     GCS: GCS eye subscore is 4. GCS verbal subscore is 5. GCS motor subscore is 6.     Comments: Speech is clear and goal oriented, follows commands Major Cranial nerves without deficit, no facial droop Moves extremities without ataxia, coordination intact  Psychiatric:        Attention and Perception: He does not perceive auditory  or visual hallucinations.        Behavior: Behavior normal. Behavior is cooperative.        Thought Content: Thought content does not include homicidal or suicidal ideation.     ED Results / Procedures / Treatments   Labs (all labs ordered are listed, but only abnormal results are displayed) Labs Reviewed  COMPREHENSIVE METABOLIC PANEL - Abnormal; Notable for the following components:      Result Value   Glucose, Bld 103 (*)    All other components within normal limits  ETHANOL - Abnormal; Notable for the following components:   Alcohol, Ethyl (B) 217 (*)    All other components within normal limits  SALICYLATE LEVEL - Abnormal; Notable for the following components:   Salicylate Lvl <3.1 (*)    All other components within normal limits  ACETAMINOPHEN LEVEL - Abnormal; Notable for the following components:   Acetaminophen (Tylenol), Serum <10 (*)    All other components within normal limits  CBC WITH DIFFERENTIAL/PLATELET  RAPID URINE DRUG SCREEN, HOSP PERFORMED    EKG EKG Interpretation  Date/Time:  Friday October 16 2020 18:45:06 EDT Ventricular Rate:  73 PR Interval:    QRS Duration: 92 QT Interval:  410 QTC Calculation: 452 R Axis:   75 Text Interpretation: Sinus rhythm Baseline wander in lead(s) V2 compared wtih multiple prior EKG's, no signfiicant changes Confirmed by Noemi Chapel [phone]) on 10/16/2020 6:58:59 PM   Radiology No results found.  Procedures Procedures   Medications Ordered in ED Medications   LORazepam (ATIVAN) injection 0.5 mg (0.5 mg Intravenous Given 10/16/20 1845)    ED Course  I have reviewed the triage vital signs and the nursing notes.  Pertinent labs & imaging results that were available during my care of the patient were reviewed by me and considered in my medical decision making (see chart for details).    MDM Rules/Calculators/A&P                         Additional history obtained from: 1. Nursing notes from this visit. 2. Review electronic medical records. -------------- 55 year old male presented with request for medication for concern of future alcohol withdrawal.  He reports he relapsed tonight drinking liquor.  On evaluation he is well-appearing no acute distress does not appear clinically intoxicated.  Patient denies any homicidal or suicidal ideations or hallucinations or symptoms of withdrawal at this point.  He has no physical complaints infectious symptoms or any other concerns.  Will obtain medical clearance labs and give Ativan and monitor. - I ordered, reviewed and interpreted labs which include: Ethanol elevated 217 CMP shows no emergent electrolyte derangement, AKI, LFT elevations or gap. CBC shows no oxytocics, anemia or thrombocytopenia. Salicylate/Tylenol levels negative  EKG: Sinus rhythm Baseline wander in lead(s) V2 compared wtih multiple prior EKG's, no signfiicant changes Confirmed by Noemi Chapel [phone]) on 10/16/2020 6:58:59 PM - Patient reassessed he is sleeping comfortably bed no acute distress he is arousable to voice.  He reports that he is feeling well he has no complaints.  A Librium taper was sent to the patient's pharmacy, patient states understanding of Librium precautions and to avoid any future drug or alcohol use while taking medication.  He was given multiple resources for rehabilitation and encouraged to follow-up with his primary care provider.  Patient with a steady gait at discharge, tolerating p.o. without difficulty.  Does  not appear to be danger to himself or others there is no  indication for psychiatric consultation or further medical work-up.  At this time there does not appear to be any evidence of an acute emergency medical condition and the patient appears stable for discharge with appropriate outpatient follow up. Diagnosis was discussed with patient who verbalizes understanding of care plan and is agreeable to discharge. I have discussed return precautions with patient who verbalizes understanding. Patient encouraged to follow-up with their PCP. All questions answered.  Patient's case discussed with Dr. Sabra Heck who agrees with plan to discharge with follow-up.   Note: Portions of this report may have been transcribed using voice recognition software. Every effort was made to ensure accuracy; however, inadvertent computerized transcription errors may still be present. Final Clinical Impression(s) / ED Diagnoses Final diagnoses:  Alcoholic intoxication without complication [facility])    Rx / DC Orders ED Discharge Orders         Ordered    chlordiazePOXIDE (LIBRIUM) 25 MG capsule        10/16/20 1953           Gari Crown 10/20/20 2354    Noemi Chapel, MD 10/23/20 [phone]

## 2020-10-16 NOTE — ED Triage Notes (Signed)
Pt reports he wants to stop drinking alcohol. Last had fifth of liquor 1 hour ago.

## 2020-10-16 NOTE — ED Notes (Signed)
Pt requesting to see [place] PA. Pa made aware. Pt states " I can handle my alcoholism". Pt asked how much ativan he received. I explained the dose to him was 0.5mg . Pt states " that's not going to do anything, I want even feel that". PA made aware.

## 2020-10-16 NOTE — ED Notes (Signed)
Pt left without staying to get VS updated @ discharge.

## 2020-10-16 NOTE — ED Notes (Signed)
Pt provided with a urinal.

## 2020-10-16 NOTE — Discharge Instructions (Signed)
At this time there does not appear to be the presence of an emergent medical condition, however there is always the potential for conditions to change. Please read and follow the below instructions.  Please return to the Emergency Department immediately for any new or worsening symptoms. Please be sure to follow up with your Primary Care Provider within one week regarding your visit today; please call their office to schedule an appointment even if you are feeling better for a follow-up visit. You may use the medication Librium as prescribed to help avoid alcohol withdrawals.  Do not drink alcohol or use any other sedating medications with Librium as will make you drowsy. Please use the resource guidance given to you today to help find a local help with your alcohol.  Go to the nearest Emergency Department immediately if: You have fever or chills You have any of the following: Moderate or very bad trouble with: Movement (coordination). Talking. Memory. Paying attention to things. Trouble staying awake. Being very confused. Jerky movements that you cannot control (seizure). Light-headedness. Fainting. Throwing up (vomiting) blood. The blood may be bright red or look like coffee grounds. Blood in your poop (stool). The blood may: Be bright red. Make your poop black and tarry and make it smell bad. Feeling shaky when you try to stop drinking. Thoughts about hurting yourself or others. You have fast or uneven heartbeats. You have chest pain. You have trouble breathing. You have a seizure for the first time. You see, hear, feel, smell, or taste something that is not there. You get very confused. You have any new/concerning or worsening of symptoms.    Please read the additional information packets attached to your discharge summary.  Do not take your medicine if  develop an itchy rash, swelling in your mouth or lips, or difficulty breathing; call 911 and seek immediate emergency  medical attention if this occurs.  You may review your lab tests and imaging results in their entirety on your MyChart account.  Please discuss all results of fully with your primary care provider and other specialist at your follow-up visit.  Note: Portions of this text may have been transcribed using voice recognition software. Every effort was made to ensure accuracy; however, inadvertent computerized transcription errors may still be present.

## 2020-10-16 NOTE — ED Notes (Signed)
Pt given water and crackers/peanut butter per request.

## 2021-07-09 ENCOUNTER — Encounter (HOSPITAL_COMMUNITY): Payer: Self-pay | Admitting: *Deleted

## 2021-07-09 ENCOUNTER — Emergency Department (HOSPITAL_COMMUNITY)
Admission: EM | Admit: 2021-07-09 | Discharge: 2021-07-09 | Disposition: A | Discharge status: Home / Self Care | Payer: Medicaid - Out of State | Attending: Emergency Medicine | Admitting: Emergency Medicine

## 2021-07-09 DIAGNOSIS — Z85528 Personal history of other malignant neoplasm of kidney: Secondary | ICD-10-CM | POA: Diagnosis not present

## 2021-07-09 DIAGNOSIS — Z79899 Other long term (current) drug therapy: Secondary | ICD-10-CM | POA: Insufficient documentation

## 2021-07-09 DIAGNOSIS — Z96642 Presence of left artificial hip joint: Secondary | ICD-10-CM | POA: Insufficient documentation

## 2021-07-09 DIAGNOSIS — I10 Essential (primary) hypertension: Secondary | ICD-10-CM | POA: Diagnosis not present

## 2021-07-09 DIAGNOSIS — F329 Major depressive disorder, single episode, unspecified: Secondary | ICD-10-CM | POA: Diagnosis not present

## 2021-07-09 DIAGNOSIS — Y905 Blood alcohol level of 100-119 mg/100 ml: Secondary | ICD-10-CM | POA: Insufficient documentation

## 2021-07-09 DIAGNOSIS — F10120 Alcohol abuse with intoxication, uncomplicated: Secondary | ICD-10-CM | POA: Diagnosis present

## 2021-07-09 DIAGNOSIS — F17210 Nicotine dependence, cigarettes, uncomplicated: Secondary | ICD-10-CM | POA: Diagnosis not present

## 2021-07-09 DIAGNOSIS — F1010 Alcohol abuse, uncomplicated: Secondary | ICD-10-CM

## 2021-07-09 LAB — COMPREHENSIVE METABOLIC PANEL
ALT: 23 U/L (ref 0–44)
AST: 34 U/L (ref 15–41)
Albumin: 4 g/dL (ref 3.5–5.0)
Alkaline Phosphatase: 56 U/L (ref 38–126)
Anion gap: 9 (ref 5–15)
BUN: 17 mg/dL (ref 6–20)
CO2: 25 mmol/L (ref 22–32)
Calcium: 9.1 mg/dL (ref 8.9–10.3)
Chloride: 101 mmol/L (ref 98–111)
Creatinine, Ser: 1.12 mg/dL (ref 0.61–1.24)
GFR, Estimated: >60 mL/min (ref >60)
Glucose, Bld: 89 mg/dL (ref 70–99)
Potassium: 3.6 mmol/L (ref 3.5–5.1)
Sodium: 135 mmol/L (ref 135–145)
Total Bilirubin: 0.2 mg/dL — ABNORMAL LOW (ref 0.3–1.2)
Total Protein: 6.8 g/dL (ref 6.5–8.1)

## 2021-07-09 LAB — RAPID URINE DRUG SCREEN, HOSP PERFORMED
Amphetamines: NOT DETECTED
Barbiturates: NOT DETECTED
Benzodiazepines: POSITIVE — AB
Cocaine: NOT DETECTED
Opiates: NOT DETECTED
Tetrahydrocannabinol: POSITIVE — AB

## 2021-07-09 LAB — CBC
HCT: 36.9 % — ABNORMAL LOW (ref 39.0–52.0)
Hemoglobin: 12.8 g/dL — ABNORMAL LOW (ref 13.0–17.0)
MCH: 31 pg (ref 26.0–34.0)
MCHC: 34.7 g/dL (ref 30.0–36.0)
MCV: 89.3 fL (ref 80.0–100.0)
Platelets: 157 10*3/uL (ref 150–400)
RBC: 4.13 MIL/uL — ABNORMAL LOW (ref 4.22–5.81)
RDW: 12.5 % (ref 11.5–15.5)
WBC: 6.9 10*3/uL (ref 4.0–10.5)
nRBC: 0 % (ref 0.0–0.2)

## 2021-07-09 LAB — ETHANOL: Alcohol, Ethyl (B): 117 mg/dL — ABNORMAL HIGH (ref <10)

## 2021-07-09 MED ORDER — HYDROXYZINE HCL 50 MG/ML IM SOLN
50.0000 mg | Freq: Once | INTRAMUSCULAR | Status: AC
  Administered 2021-07-09: 50 mg via INTRAMUSCULAR
  Filled 2021-07-09: qty 1

## 2021-07-09 NOTE — ED Notes (Signed)
Pt resting on left side with eyes closed. Appears to be sleeping. Even rise and fall of chest. Call bell within reach

## 2021-07-09 NOTE — ED Provider Notes (Signed)
[facility] EMERGENCY DEPARTMENT Provider Note   CSN: [id number] Arrival date & time: 07/09/21  1635     History Chief Complaint  Patient presents with   Alcohol Intoxication    Kevin [name] is a 55 y.o. male.  Patient states he started back drinking again.  Patient states he wants some help for his alcohol abuse  The history is provided by the patient and medical records. No language interpreter was used.  Alcohol Intoxication This is a recurrent problem. The current episode started 2 days ago. The problem occurs constantly. The problem has not changed since onset.Pertinent negatives include no chest pain, no abdominal pain and no headaches. Nothing aggravates the symptoms. Nothing relieves the symptoms. He has tried nothing for the symptoms.      Past Medical History:  Diagnosis Date   Anxiety    Arthritis    Cancer (Kevin [name])    kidney   Depression    Former consumption of alcohol    quit 2013   Hypertension    Insomnia    Left renal mass 2012   renal cell carcinoma, completely excised   Migraines    Renal cell cancer, left (Kevin [name]) 2012    Patient Active Problem List   Diagnosis Date Noted   Trauma 06/10/2020   Mandible open fracture ([place]) 06/10/2020   Left rib fracture 06/10/2020   Left fibular fracture 06/10/2020   Splenic laceration 06/10/2020   Osteoarthritis of left hip 04/29/2016   Status post left hip replacement 04/29/2016    Past Surgical History:  Procedure Laterality Date   MANDIBLE FRACTURE SURGERY  age 42   ORIF MANDIBULAR FRACTURE N/A 06/10/2020   Procedure: OPEN REDUCTION INTERNAL FIXATION (ORIF) MANDIBULAR FRACTURE;  Surgeon: Jerrell Belfast, MD;  Location: [facility];  Service: ENT;  Laterality: N/A;   ROBOTIC ASSITED PARTIAL NEPHRECTOMY Left 2012   TOTAL HIP ARTHROPLASTY Left 04/29/2016   Procedure: LEFT TOTAL HIP ARTHROPLASTY ANTERIOR APPROACH;  Surgeon: Mcarthur Rossetti, MD;  Location: [facility];  Service: Orthopedics;  Laterality: Left;    TOTAL HIP ARTHROPLASTY Left 2017       Family History  Problem Relation Age of Onset   Cancer Mother     Social History   Tobacco Use   Smoking status: Every Day    Packs/day: 1.00    Years: 30.00    Pack years: 30.00    Types: Cigarettes, E-cigarettes   Smokeless tobacco: Never  Vaping Use   Vaping Use: Former  Substance Use Topics   Alcohol use: Yes    Comment: fifth of liquor daily    Drug use: No    Home Medications Prior to Admission medications   Medication Sig Start Date End Date Taking? Authorizing Provider  bacitracin ointment Apply topically 2 (two) times daily. 06/15/20   Maczis, Barth Kirks, PA-C  buprenorphine-naloxone (SUBOXONE) 8-2 mg SUBL SL tablet Place 1 tablet under the tongue in the morning and at bedtime.    [provider]  chlordiazePOXIDE (LIBRIUM) 25 MG capsule 50mg  PO TID x 1D, then 25-50mg  PO BID X 1D, then 25-50mg  PO QD X 1D 10/16/20   Nuala Alpha A, PA-C  clindamycin (CLEOCIN) 150 MG capsule Take 2 po QID 06/22/20   Rolland Porter, MD  cyclobenzaprine (FLEXERIL) 10 MG tablet Take 1 tablet (10 mg total) by mouth 3 (three) times daily as needed for muscle spasms. Patient not taking: Reported on 86/76/7209 47/09/62   Delora Fuel, MD  FLUoxetine (PROZAC) 20 MG tablet Take  20 mg by mouth daily.    [provider]  gabapentin (NEURONTIN) 100 MG capsule Take one every 8 hours as needed for pain 08/01/20   Milton Ferguson, MD  lidocaine (LIDODERM) 5 % Place 1 patch onto the skin daily. Remove & Discard patch within 12 hours or as directed by MD Patient not taking: Reported on 06/21/2020 06/16/20   Maczis, Barth Kirks, PA-C  metoprolol tartrate (LOPRESSOR) 50 MG tablet Take 50 mg by mouth 2 (two) times daily.     [provider]  penicillin v potassium (VEETID) 500 MG tablet Take 1 tablet (500 mg total) by mouth 3 (three) times daily. Patient not taking: Reported on 06/21/2020 05/16/20   Dorie Rank, MD  triamcinolone cream (KENALOG)  0.1 % Apply 1 application topically 2 (two) times daily. 02/29/20   Margarita Mail, PA-C    Allergies    Patient has no known allergies.  Review of Systems   Review of Systems  Constitutional:  Negative for appetite change and fatigue.  HENT:  Negative for congestion, ear discharge and sinus pressure.   Eyes:  Negative for discharge.  Respiratory:  Negative for cough.   Cardiovascular:  Negative for chest pain.  Gastrointestinal:  Negative for abdominal pain and diarrhea.  Genitourinary:  Negative for frequency and hematuria.  Musculoskeletal:  Negative for back pain.  Skin:  Negative for rash.  Neurological:  Negative for seizures and headaches.  Psychiatric/Behavioral:  Negative for hallucinations.    Physical Exam Updated Vital Signs BP 113/61   Pulse 80   Temp 97.8 F (36.6 C) (Oral)   Resp 18   SpO2 100%   Physical Exam Vitals and nursing note reviewed.  Constitutional:      Appearance: He is well-developed.  HENT:     Head: Normocephalic.     Nose: Nose normal.  Eyes:     General: No scleral icterus.    Conjunctiva/sclera: Conjunctivae normal.  Neck:     Thyroid: No thyromegaly.  Cardiovascular:     Rate and Rhythm: Normal rate and regular rhythm.     Heart sounds: No murmur heard.   No friction rub. No gallop.  Pulmonary:     Breath sounds: No stridor. No wheezing or rales.  Chest:     Chest wall: No tenderness.  Abdominal:     General: There is no distension.     Tenderness: There is no abdominal tenderness. There is no rebound.  Musculoskeletal:        General: Normal range of motion.     Cervical back: Neck supple.  Lymphadenopathy:     Cervical: No cervical adenopathy.  Skin:    Findings: No erythema or rash.  Neurological:     Mental Status: He is alert and oriented to person, place, and time.     Motor: No abnormal muscle tone.     Coordination: Coordination normal.  Psychiatric:     Comments: Depression,  not suicidal    ED Results /  Procedures / Treatments   Labs (all labs ordered are listed, but only abnormal results are displayed) Labs Reviewed  COMPREHENSIVE METABOLIC PANEL - Abnormal; Notable for the following components:      Result Value   Total Bilirubin 0.2 (*)    All other components within normal limits  ETHANOL - Abnormal; Notable for the following components:   Alcohol, Ethyl (B) 117 (*)    All other components within normal limits  CBC - Abnormal; Notable for the following components:  RBC 4.13 (*)    Hemoglobin 12.8 (*)    HCT 36.9 (*)    All other components within normal limits  RAPID URINE DRUG SCREEN, HOSP PERFORMED - Abnormal; Notable for the following components:   Benzodiazepines POSITIVE (*)    Tetrahydrocannabinol POSITIVE (*)    All other components within normal limits    EKG None  Radiology No results found.  Procedures Procedures   Medications Ordered in ED Medications  hydrOXYzine (VISTARIL) injection 50 mg (has no administration in time range)    ED Course  I have reviewed the triage vital signs and the nursing notes.  Pertinent labs & imaging results that were available during my care of the patient were reviewed by me and considered in my medical decision making (see chart for details).    MDM Rules/Calculators/A&P                            Patient stable with substance abuse he will be referred to [facility] Final Clinical Impression(s) / ED Diagnoses Final diagnoses:  ETOH abuse    Rx / DC Orders ED Discharge Orders     None        Milton Ferguson, MD 07/10/21 1326

## 2021-07-09 NOTE — ED Notes (Signed)
Dc instructions reviewed with pt. Pt verbalized understanding and stated he would follow up with [facility]. Pt stated he was walking home. No questions or concerns at this time.

## 2021-07-09 NOTE — Discharge Instructions (Addendum)
Follow up with [facility]

## 2021-07-09 NOTE — ED Triage Notes (Signed)
States he started drinking heavily 6 days ago after being sober for years, requesting detox
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
## 2022-09-03 ENCOUNTER — Other Ambulatory Visit: Payer: Self-pay

## 2022-09-03 ENCOUNTER — Encounter (HOSPITAL_COMMUNITY): Payer: Self-pay

## 2022-09-03 ENCOUNTER — Emergency Department (HOSPITAL_COMMUNITY)
Admission: EM | Admit: 2022-09-03 | Discharge: 2022-09-03 | Disposition: A | Discharge status: Home / Self Care | Payer: Medicaid - Out of State | Attending: Emergency Medicine | Admitting: Emergency Medicine

## 2022-09-03 DIAGNOSIS — F1010 Alcohol abuse, uncomplicated: Secondary | ICD-10-CM | POA: Diagnosis not present

## 2022-09-03 DIAGNOSIS — Z85528 Personal history of other malignant neoplasm of kidney: Secondary | ICD-10-CM | POA: Insufficient documentation

## 2022-09-03 DIAGNOSIS — Z79899 Other long term (current) drug therapy: Secondary | ICD-10-CM | POA: Diagnosis not present

## 2022-09-03 DIAGNOSIS — I10 Essential (primary) hypertension: Secondary | ICD-10-CM | POA: Insufficient documentation

## 2022-09-03 DIAGNOSIS — R1110 Vomiting, unspecified: Secondary | ICD-10-CM | POA: Diagnosis present

## 2022-09-03 LAB — CBC WITH DIFFERENTIAL/PLATELET
Abs Immature Granulocytes: 0.01 10*3/uL (ref 0.00–0.07)
Basophils Absolute: 0 10*3/uL (ref 0.0–0.1)
Basophils Relative: 0 %
Eosinophils Absolute: 0.1 10*3/uL (ref 0.0–0.5)
Eosinophils Relative: 2 %
HCT: 40.5 % (ref 39.0–52.0)
Hemoglobin: 14.3 g/dL (ref 13.0–17.0)
Immature Granulocytes: 0 %
Lymphocytes Relative: 26 %
Lymphs Abs: 1.8 10*3/uL (ref 0.7–4.0)
MCH: 31.3 pg (ref 26.0–34.0)
MCHC: 35.3 g/dL (ref 30.0–36.0)
MCV: 88.6 fL (ref 80.0–100.0)
Monocytes Absolute: 0.4 10*3/uL (ref 0.1–1.0)
Monocytes Relative: 6 %
Neutro Abs: 4.5 10*3/uL (ref 1.7–7.7)
Neutrophils Relative %: 66 %
Platelets: 172 10*3/uL (ref 150–400)
RBC: 4.57 MIL/uL (ref 4.22–5.81)
RDW: 11.9 % (ref 11.5–15.5)
WBC: 6.9 10*3/uL (ref 4.0–10.5)
nRBC: 0 % (ref 0.0–0.2)

## 2022-09-03 LAB — COMPREHENSIVE METABOLIC PANEL
ALT: 11 U/L (ref 0–44)
AST: 24 U/L (ref 15–41)
Albumin: 4.1 g/dL (ref 3.5–5.0)
Alkaline Phosphatase: 72 U/L (ref 38–126)
Anion gap: 12 (ref 5–15)
BUN: 14 mg/dL (ref 6–20)
CO2: 26 mmol/L (ref 22–32)
Calcium: 8.9 mg/dL (ref 8.9–10.3)
Chloride: 105 mmol/L (ref 98–111)
Creatinine, Ser: 0.94 mg/dL (ref 0.61–1.24)
GFR, Estimated: >60 mL/min (ref >60)
Glucose, Bld: 92 mg/dL (ref 70–99)
Potassium: 3.5 mmol/L (ref 3.5–5.1)
Sodium: 143 mmol/L (ref 135–145)
Total Bilirubin: 0.6 mg/dL (ref 0.3–1.2)
Total Protein: 7.2 g/dL (ref 6.5–8.1)

## 2022-09-03 LAB — SALICYLATE LEVEL: Salicylate Lvl: <7 mg/dL — ABNORMAL LOW (ref 7.0–30.0)

## 2022-09-03 LAB — ACETAMINOPHEN LEVEL: Acetaminophen (Tylenol), Serum: <10 ug/mL — ABNORMAL LOW (ref 10–30)

## 2022-09-03 LAB — ETHANOL: Alcohol, Ethyl (B): 198 mg/dL — ABNORMAL HIGH (ref <10)

## 2022-09-03 MED ORDER — HYDROXYZINE HCL 25 MG PO TABS
25.0000 mg | ORAL_TABLET | Freq: Four times a day (QID) | ORAL | Status: DC | PRN
  Administered 2022-09-03: 25 mg via ORAL
  Filled 2022-09-03: qty 1

## 2022-09-03 MED ORDER — ONDANSETRON 4 MG PO TBDP
4.0000 mg | ORAL_TABLET | Freq: Four times a day (QID) | ORAL | Status: DC | PRN
  Administered 2022-09-03: 4 mg via ORAL
  Filled 2022-09-03: qty 1

## 2022-09-03 MED ORDER — ADULT MULTIVITAMIN W/MINERALS CH: 1.0000 | ORAL_TABLET | Freq: Every day | ORAL | Status: DC

## 2022-09-03 MED ORDER — CHLORDIAZEPOXIDE HCL 25 MG PO CAPS
25.0000 mg | ORAL_CAPSULE | Freq: Four times a day (QID) | ORAL | Status: DC | PRN
  Filled 2022-09-03: qty 1

## 2022-09-03 MED ORDER — ONDANSETRON HCL 4 MG/2ML IJ SOLN
4.0000 mg | Freq: Once | INTRAMUSCULAR | Status: AC
  Administered 2022-09-03: 4 mg via INTRAVENOUS
  Filled 2022-09-03: qty 2

## 2022-09-03 MED ORDER — SODIUM CHLORIDE 0.9 % IV BOLUS
1000.0000 mL | Freq: Once | INTRAVENOUS | Status: AC
  Administered 2022-09-03: 1000 mL via INTRAVENOUS

## 2022-09-03 MED ORDER — LOPERAMIDE HCL 2 MG PO CAPS
2.0000 mg | ORAL_CAPSULE | ORAL | Status: DC | PRN
  Filled 2022-09-03: qty 1

## 2022-09-03 MED ORDER — CHLORDIAZEPOXIDE HCL 25 MG PO CAPS
50.0000 mg | ORAL_CAPSULE | Freq: Once | ORAL | Status: AC
  Administered 2022-09-03: 50 mg via ORAL
  Filled 2022-09-03: qty 2

## 2022-09-03 MED ORDER — THIAMINE HCL 100 MG/ML IJ SOLN
100.0000 mg | Freq: Once | INTRAMUSCULAR | Status: AC
  Administered 2022-09-03: 100 mg via INTRAMUSCULAR
  Filled 2022-09-03: qty 2

## 2022-09-03 NOTE — ED Triage Notes (Signed)
Withdrawal from Suboxone. Nausea, vomiting, diarrhea. Last dose of suboxone 4 days ago.

## 2022-09-03 NOTE — ED Provider Notes (Signed)
[facility] Provider Note   CSN: [id number] Arrival date & time: 09/03/22  7412     History  Chief Complaint  Patient presents with   Withdrawal    MIR Kevin [name] is a 57 y.o. male.  57 year old male with past medical history of kidney cancer, hypertension, arthritis, alcohol abuse presents with concern for withdrawal from Suboxone, Xanax, alcohol.  Patient states he got a DUI but was released, unable to locate his Suboxone, has been taking his Xanax trying to detox.  Ran out of his Xanax or lost it today and presents with complaint of severe vomiting.  Reports drinking 3 airplane shots last night, last drink at 10pm. He denies auditory visual hallucinations, suicidal or homicidal ideation, changes in bowel or bladder habits.       Home Medications Prior to Admission medications   Medication Sig Start Date End Date Taking? Authorizing Provider  bacitracin ointment Apply topically 2 (two) times daily. 06/15/20   Maczis, Barth Kirks, PA-C  buprenorphine-naloxone (SUBOXONE) 8-2 mg SUBL SL tablet Place 1 tablet under the tongue in the morning and at bedtime.    [provider]  chlordiazePOXIDE (LIBRIUM) 25 MG capsule '50mg'$  PO TID x 1D, then 25-'50mg'$  PO BID X 1D, then 25-'50mg'$  PO QD X 1D 10/16/20   Nuala Alpha A, PA-C  clindamycin (CLEOCIN) 150 MG capsule Take 2 po QID 06/22/20   Rolland Porter, MD  cyclobenzaprine (FLEXERIL) 10 MG tablet Take 1 tablet (10 mg total) by mouth 3 (three) times daily as needed for muscle spasms. Patient not taking: Reported on 87/86/7672 09/47/09   Delora Fuel, MD  FLUoxetine (PROZAC) 20 MG tablet Take 20 mg by mouth daily.    [provider]  gabapentin (NEURONTIN) 100 MG capsule Take one every 8 hours as needed for pain 08/01/20   Milton Ferguson, MD  lidocaine (LIDODERM) 5 % Place 1 patch onto the skin daily. Remove & Discard patch within 12 hours or as directed by MD Patient not taking: Reported  on 06/21/2020 06/16/20   Maczis, Barth Kirks, PA-C  metoprolol tartrate (LOPRESSOR) 50 MG tablet Take 50 mg by mouth 2 (two) times daily.     [provider]  penicillin v potassium (VEETID) 500 MG tablet Take 1 tablet (500 mg total) by mouth 3 (three) times daily. Patient not taking: Reported on 06/21/2020 05/16/20   Dorie Rank, MD  triamcinolone cream (KENALOG) 0.1 % Apply 1 application topically 2 (two) times daily. 02/29/20   Margarita Mail, PA-C      Allergies    Patient has no known allergies.    Review of Systems   Review of Systems Negative except as per HPI Physical Exam Updated Vital Signs BP (!) 136/93 (BP Location: Left Arm)   Pulse 76   Temp (!) 97.4 F (36.3 C) (Oral)   Resp 16   SpO2 97%  Physical Exam Vitals and nursing note reviewed.  Constitutional:      General: He is not in acute distress.    Appearance: He is well-developed. He is not diaphoretic.  HENT:     Head: Normocephalic and atraumatic.  Cardiovascular:     Rate and Rhythm: Normal rate and regular rhythm.     Heart sounds: Normal heart sounds.  Pulmonary:     Effort: Pulmonary effort is normal.     Breath sounds: Normal breath sounds.  Abdominal:     Palpations: Abdomen is soft.     Tenderness: There  is no abdominal tenderness.  Skin:    General: Skin is warm and dry.     Findings: No erythema or rash.  Neurological:     Mental Status: He is alert and oriented to person, place, and time.  Psychiatric:        Behavior: Behavior normal.     ED Results / Procedures / Treatments   Labs (all labs ordered are listed, but only abnormal results are displayed) Labs Reviewed  ETHANOL - Abnormal; Notable for the following components:      Result Value   Alcohol, Ethyl (B) 198 (*)    All other components within normal limits  SALICYLATE LEVEL - Abnormal; Notable for the following components:   Salicylate Lvl <1.4 (*)    All other components within normal limits  ACETAMINOPHEN LEVEL -  Abnormal; Notable for the following components:   Acetaminophen (Tylenol), Serum <10 (*)    All other components within normal limits  COMPREHENSIVE METABOLIC PANEL  CBC WITH DIFFERENTIAL/PLATELET  URINALYSIS, ROUTINE W REFLEX MICROSCOPIC  RAPID URINE DRUG SCREEN, HOSP PERFORMED    EKG None  Radiology No results found.  Procedures Procedures    Medications Ordered in ED Medications  multivitamin with minerals tablet 1 tablet (has no administration in time range)  chlordiazePOXIDE (LIBRIUM) capsule 25 mg (has no administration in time range)  hydrOXYzine (ATARAX) tablet 25 mg (25 mg Oral Given 09/03/22 0523)  loperamide (IMODIUM) capsule 2-4 mg (has no administration in time range)  ondansetron (ZOFRAN-ODT) disintegrating tablet 4 mg (4 mg Oral Given 09/03/22 0523)  sodium chloride 0.9 % bolus 1,000 mL (1,000 mLs Intravenous Bolus 09/03/22 0450)  ondansetron (ZOFRAN) injection 4 mg (4 mg Intravenous Given 09/03/22 0451)  thiamine (VITAMIN B1) injection 100 mg (100 mg Intramuscular Given 09/03/22 0524)  chlordiazePOXIDE (LIBRIUM) capsule 50 mg (50 mg Oral Given 09/03/22 4315)    ED Course/ Medical Decision Making/ A&P                             Medical Decision Making Amount and/or Complexity of Data Reviewed Labs: ordered.  Risk Prescription drug management.   This patient presents to the ED for concern of withdrawal from benzo, alcohol, suboxone, this involves an extensive number of treatment options, and is a complaint that carries with it a high risk of complications and morbidity.  The differential diagnosis includes polysubstance abuse, misuse of medications    Co morbidities that complicate the patient evaluation  Alcohol abuse renal cell carcinoma, hypertension, depression   Additional history obtained:  External records from outside source obtained and reviewed including recent records on file including labs.  Review of controlled substance database   Lab  Tests:  I Ordered, and personally interpreted labs.  The pertinent results include: Alcohol is elevated at 400 otherwise salicylate and acetaminophen levels are negative.  CBC and CMP are unremarkable.   Problem List / ED Course / Critical interventions / Medication management  57 year old male presents with concern for alcohol withdrawal reporting having had 3 small airplane shots with last consumed at 10 PM last night despite alcohol elevated at nearly 200.  Also reports having lost to run out of his Suboxone and Xanax and requesting refills of his Suboxone.  Informed patient, do not have Suboxone at the facility, do not prescribe Suboxone.  Provided with outpatient resources.   I ordered medication including Librium, thiamine, Zofran, IV fluids, hydroxyzine for concern for withdrawal Reevaluation of  the patient after these medicines showed that the patient improved I have reviewed the patients home medicines and have made adjustments as needed   Social Determinants of Health:  No PCP, without transportation   Test / Admission - Considered:  UDS not completed while in the ER, sample not provided, not necessary for dispo planning.         Final Clinical Impression(s) / ED Diagnoses Final diagnoses:  Alcohol abuse    Rx / DC Orders ED Discharge Orders     None         Tacy Learn, PA-C 95/18/84 1660    Delora Fuel, MD 63/01/60 [phone]
